# Patient Record
Sex: Male | Born: 1950 | Race: White | Hispanic: No | Marital: Married | Smoking: Never smoker
Health system: Southern US, Community
[De-identification: ages and names within clinical notes are randomized; demographics above are authoritative.]

## PROBLEM LIST (undated history)

## (undated) DIAGNOSIS — I890 Lymphedema, not elsewhere classified: Secondary | ICD-10-CM

## (undated) DIAGNOSIS — I4891 Unspecified atrial fibrillation: Secondary | ICD-10-CM

## (undated) DIAGNOSIS — I509 Heart failure, unspecified: Secondary | ICD-10-CM

## (undated) DIAGNOSIS — Z973 Presence of spectacles and contact lenses: Secondary | ICD-10-CM

## (undated) DIAGNOSIS — I639 Cerebral infarction, unspecified: Secondary | ICD-10-CM

## (undated) DIAGNOSIS — I2699 Other pulmonary embolism without acute cor pulmonale: Secondary | ICD-10-CM

## (undated) DIAGNOSIS — I429 Cardiomyopathy, unspecified: Secondary | ICD-10-CM

## (undated) DIAGNOSIS — G4733 Obstructive sleep apnea (adult) (pediatric): Secondary | ICD-10-CM

## (undated) DIAGNOSIS — I5022 Chronic systolic (congestive) heart failure: Secondary | ICD-10-CM

## (undated) DIAGNOSIS — I739 Peripheral vascular disease, unspecified: Secondary | ICD-10-CM

## (undated) DIAGNOSIS — E119 Type 2 diabetes mellitus without complications: Secondary | ICD-10-CM

## (undated) DIAGNOSIS — E559 Vitamin D deficiency, unspecified: Secondary | ICD-10-CM

## (undated) DIAGNOSIS — I10 Essential (primary) hypertension: Secondary | ICD-10-CM

## (undated) DIAGNOSIS — Z992 Dependence on renal dialysis: Secondary | ICD-10-CM

## (undated) DIAGNOSIS — E039 Hypothyroidism, unspecified: Secondary | ICD-10-CM

## (undated) DIAGNOSIS — R519 Headache, unspecified: Secondary | ICD-10-CM

## (undated) DIAGNOSIS — E785 Hyperlipidemia, unspecified: Secondary | ICD-10-CM

## (undated) DIAGNOSIS — N186 End stage renal disease: Secondary | ICD-10-CM

## (undated) HISTORY — PX: CATARACT EXTRACTION W/ INTRAOCULAR LENS  IMPLANT, BILATERAL: SHX1307

## (undated) HISTORY — DX: Heart failure, unspecified: I50.9

## (undated) HISTORY — DX: End stage renal disease: Z99.2

## (undated) HISTORY — DX: Obstructive sleep apnea (adult) (pediatric): G47.33

## (undated) HISTORY — DX: End stage renal disease: N18.6

## (undated) HISTORY — DX: Lymphedema, not elsewhere classified: I89.0

## (undated) HISTORY — DX: Essential (primary) hypertension: I10

## (undated) HISTORY — DX: Unspecified atrial fibrillation: I48.91

## (undated) HISTORY — DX: Hypothyroidism, unspecified: E03.9

## (undated) HISTORY — DX: Cardiomyopathy, unspecified: I42.9

## (undated) HISTORY — PX: TONSILLECTOMY: SUR1361

## (undated) HISTORY — PX: HERNIA REPAIR: SHX51

## (undated) HISTORY — DX: Hyperlipidemia, unspecified: E78.5

## (undated) HISTORY — PX: APPENDECTOMY: SHX54

## (undated) HISTORY — DX: Peripheral vascular disease, unspecified: I73.9

## (undated) HISTORY — PX: WISDOM TOOTH EXTRACTION: SHX21

## (undated) HISTORY — PX: BARIATRIC SURGERY: SHX1103

---
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
## 2019-01-07 ENCOUNTER — Encounter: Payer: Self-pay | Admitting: Cardiology

## 2019-01-07 ENCOUNTER — Ambulatory Visit: Payer: Medicare HMO | Admitting: Cardiology

## 2019-01-07 ENCOUNTER — Encounter (INDEPENDENT_AMBULATORY_CARE_PROVIDER_SITE_OTHER): Payer: Self-pay

## 2019-01-07 ENCOUNTER — Other Ambulatory Visit: Payer: Self-pay

## 2019-01-07 VITALS — BP 116/71 | HR 73 | Temp 97.0°F | Ht 71.0 in | Wt 209.0 lb

## 2019-01-07 DIAGNOSIS — E782 Mixed hyperlipidemia: Secondary | ICD-10-CM

## 2019-01-07 DIAGNOSIS — I890 Lymphedema, not elsewhere classified: Secondary | ICD-10-CM

## 2019-01-07 DIAGNOSIS — I480 Paroxysmal atrial fibrillation: Secondary | ICD-10-CM

## 2019-01-07 DIAGNOSIS — I4821 Permanent atrial fibrillation: Secondary | ICD-10-CM | POA: Diagnosis not present

## 2019-01-07 DIAGNOSIS — Z8679 Personal history of other diseases of the circulatory system: Secondary | ICD-10-CM

## 2019-01-07 DIAGNOSIS — I10 Essential (primary) hypertension: Secondary | ICD-10-CM

## 2019-01-07 NOTE — Progress Notes (Signed)
Cardiology Office Note  Date: 01/07/2019   ID: Lawrence Bonilla, DOB 11/17/1950, MRN [id number]  PCP:  Doree Albee, MD  Consulting Cardiologist: [name], MD Electrophysiologist:  None   Chief Complaint  Patient presents with  . Establish cardiac follow-up    History of Present Illness: Lawrence Bonilla is a 68 y.o. male referred for cardiology consultation by Dr. Anastasio Champion to establish follow-up of reported congestive heart failure and atrial fibrillation.  Patient relocated from [place] back in March.  He previously followed with a [facility], we are attempting to get his records.  He is aware of having had atrial fibrillation for many years and has been on Coumadin long-term.  He has not had a PT/INR checked since moving to this area.  The history of heart failure is a bit more difficult to ascertain at this point, he states that he was hospitalized at some point during the winter months of last year.  He has been on diuretics which were increased by his previous cardiologist following hospitalization.  He also has a longterm history of lymphedema and uses compression stockings as well as wraps in the evenings.  As far as symptoms are concerned, he does not report any sense of palpitations or chest pain.  He reports generally worsening dyspnea on exertion over a period of months.  He describes NYHA class II-III symptoms depending on level of activity.  Recent lab work from June is outlined below.  He has renal insufficiency with creatinine of 2.33 and BUN of 50.  He saw a nephrologist in [place] earlier today with further work-up ongoing.  Whether this is acute on chronic renal insufficiency is unclear.  I reviewed his medications.  He was not taken off Lasix or lisinopril per nephrologist this morning.  I personally reviewed his ECG today which shows rate controlled atrial fibrillation with left anterior fascicular block and nonspecific T wave changes.  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  Past Medical History:  Diagnosis Date  . Atrial fibrillation ([place])   . Congestive heart failure (CHF) ([place])   . DM (diabetes mellitus), type 2 ([place])   . Essential hypertension   . Hyperlipidemia   . Hypothyroidism   . Lymphedema   . PVD (peripheral vascular disease) ([place])     Past Surgical History:  Procedure Laterality Date  . APPENDECTOMY    . BARIATRIC SURGERY      Current Outpatient Medications  Medication Sig Dispense Refill  . atorvastatin (LIPITOR) 40 MG tablet Take 40 mg by mouth daily.    . furosemide (LASIX) 40 MG tablet Take 80 mg by mouth 2 (two) times daily.    . isosorbide mononitrate (IMDUR) 30 MG 24 hr tablet Take 30 mg by mouth daily.    Marland Kitchen lisinopril (ZESTRIL) 10 MG tablet Take 10 mg by mouth 2 (two) times daily.    . metoprolol succinate (TOPROL-XL) 100 MG 24 hr tablet Take 100 mg by mouth 2 (two) times daily. Take with or immediately following a meal.    . NP THYROID 30 MG tablet TK 1 T PO QD    . warfarin (COUMADIN) 2.5 MG tablet Take 2.5 mg by mouth daily. TWO TABLETS DAILY EXCEPT ONE TABLET ON TUESDAYS & THURSDAYS.     No current facility-administered medications for this visit.    Allergies:  Patient has no known allergies.  Social History: The patient  reports that he has never smoked. He has never used smokeless tobacco. He reports current alcohol use. He reports that he does not use drugs.   Family History: The patient's family history includes Asthma in his mother.   ROS:  Please see the history of present illness. Otherwise, complete review of systems is positive for none.  All other systems are reviewed and negative.   Physical Exam: VS:  BP 116/71   Pulse 73   Temp (!) 97 F (36.1 C)   Ht 5\' 11"  (1.803 m)   Wt 209 lb (94.8 kg)   BMI 29.15 kg/m , BMI Body mass index is 29.15 kg/m.  Wt Readings from Last 3 Encounters:  01/07/19 209  lb (94.8 kg)  10/31/18 209 lb (94.8 kg)    General: Overweight male in no distress. HEENT: Conjunctiva and lids normal, wearing a mask. Neck: Supple, prominent jugular venous pulsations, no thyromegaly. Lungs: Decreased breath sounds without wheezing, nonlabored breathing at rest. Cardiac: Irregularly irregular, no S3, soft apical systolic murmur, no pericardial rub. Abdomen: Soft, nontender, bowel sounds present. Extremities: Chronic appearing lymphedema and venous stasis, distal pulses diminished. Skin: Warm and dry. Musculoskeletal: Kyphosis present. Neuropsychiatric: Alert and oriented x3, affect grossly appropriate.  ECG:  No old tracing available for review.  Recent Labwork:  June 2020: Cholesterol 83, HDL 39, triglycerides 67, LDL 30, BUN 50, creatinine 2.33, potassium 4.0, sodium 142, AST 42, ALT 34, hemoglobin 13.8, platelets 127, hemoglobin A1c 6%, TSH 4.06  Other Studies Reviewed Today:  No previous cardiac studies available for review.  Assessment and Plan:  1.  Permanent atrial fibrillation by history with good heart rate control noted today by ECG and asymptomatic in terms of palpitations.  He is on Coumadin for stroke prophylaxis which he has taken for many years and is on Toprol-XL for heart rate control.  We will establish him in our anticoagulation clinic.  2.  Reported history of congestive heart failure, details unclear.  Plan to obtain records and also proceed with an echocardiogram to define LVEF.  With dyspnea on exertion that has been worsening over the last few months, anticipate further medication adjustments at a minimum.  3.  Renal insufficiency, BUN 50 and creatinine 2.33.  Patient established with a nephrologist earlier this morning.  Whether this is acute on chronic is not clear.  He is on both high-dose Lasix and ACE inhibitor.  Anticipates further renal testing which has been arranged.  4.  Chronic lymphedema.  He uses compression stockings and also  wraps his legs at nighttime.  5.  Essential hypertension, blood pressure well controlled today.  Medication Adjustments/Labs and Tests Ordered: Current medicines are reviewed at length with the patient today.  Concerns regarding medicines are outlined above.   Tests Ordered: Orders Placed This Encounter  Procedures  . EKG 12-Lead  . ECHOCARDIOGRAM COMPLETE    Medication Changes: No orders of the defined types were placed in this encounter.   Disposition:  Follow up pending study results and review of records.  Signed, [name], MD, [facility] 01/07/2019 2:47 PM    [place] [facility] [address], [place], [place] [address] Phone: [phone]; Fax: [phone]

## 2019-01-07 NOTE — Patient Instructions (Addendum)
  Medication Instructions: Your physician recommends that you continue on your current medications as directed. Please refer to the Current Medication list given to you today.   Labwork: None today  Procedures/Testing: Your physician has requested that you have an echocardiogram. Echocardiography is a painless test that uses sound waves to create images of your heart. It provides your doctor with information about the size and shape of your heart and how well your heart's chambers and valves are working. This procedure takes approximately one hour. There are no restrictions for this procedure.  We will schedule you an apt with Edrick Oh, RN, our coumadin nurse  Follow-Up: To be determined  Any Additional Special Instructions Will Be Listed Below (If Applicable).     If you need a refill on your cardiac medications before your next appointment, please call your pharmacy.

## 2019-01-10 ENCOUNTER — Ambulatory Visit (HOSPITAL_COMMUNITY)
Admission: RE | Admit: 2019-01-10 | Discharge: 2019-01-10 | Disposition: A | Discharge status: Home / Self Care | Payer: 59 | Source: Ambulatory Visit | Attending: Cardiology | Admitting: Cardiology

## 2019-01-10 ENCOUNTER — Other Ambulatory Visit: Payer: Self-pay

## 2019-01-10 DIAGNOSIS — I429 Cardiomyopathy, unspecified: Secondary | ICD-10-CM | POA: Diagnosis not present

## 2019-01-10 DIAGNOSIS — E785 Hyperlipidemia, unspecified: Secondary | ICD-10-CM | POA: Insufficient documentation

## 2019-01-10 DIAGNOSIS — Z8679 Personal history of other diseases of the circulatory system: Secondary | ICD-10-CM

## 2019-01-10 DIAGNOSIS — I509 Heart failure, unspecified: Secondary | ICD-10-CM | POA: Diagnosis not present

## 2019-01-10 DIAGNOSIS — E039 Hypothyroidism, unspecified: Secondary | ICD-10-CM | POA: Insufficient documentation

## 2019-01-10 DIAGNOSIS — I272 Pulmonary hypertension, unspecified: Secondary | ICD-10-CM | POA: Insufficient documentation

## 2019-01-10 DIAGNOSIS — I110 Hypertensive heart disease with heart failure: Secondary | ICD-10-CM | POA: Diagnosis not present

## 2019-01-10 DIAGNOSIS — I4891 Unspecified atrial fibrillation: Secondary | ICD-10-CM | POA: Diagnosis not present

## 2019-01-10 DIAGNOSIS — E119 Type 2 diabetes mellitus without complications: Secondary | ICD-10-CM | POA: Insufficient documentation

## 2019-01-10 NOTE — Progress Notes (Signed)
*  PRELIMINARY RESULTS* Echocardiogram 2D Echocardiogram has been performed.  Lawrence Bonilla 01/10/2019, 10:45 AM

## 2019-01-11 ENCOUNTER — Other Ambulatory Visit: Payer: Self-pay | Admitting: Nephrology

## 2019-01-11 DIAGNOSIS — N184 Chronic kidney disease, stage 4 (severe): Secondary | ICD-10-CM

## 2019-01-15 ENCOUNTER — Telehealth: Payer: Self-pay

## 2019-01-15 ENCOUNTER — Ambulatory Visit
Admission: RE | Admit: 2019-01-15 | Discharge: 2019-01-15 | Disposition: A | Discharge status: Home / Self Care | Payer: Medicare HMO | Source: Ambulatory Visit | Attending: Nephrology | Admitting: Nephrology

## 2019-01-15 DIAGNOSIS — N184 Chronic kidney disease, stage 4 (severe): Secondary | ICD-10-CM

## 2019-01-15 NOTE — Telephone Encounter (Signed)
-----   Message from [name], MD sent at 01/11/2019  8:11 AM EDT ----- Results reviewed.  LVEF is reduced at 25 to [id number], I am not certain about his prior baseline as we still await records.  Let us try and have him stop Zestril and replace it with Entresto after appropriate washout, start at 24/26 mg twice daily.  Reduce Lasix to 80 mg in the morning and 40 mg in the afternoon.  He will need a follow-up BMET in 7 to 10 days after this change.  Please make sure that he has an office follow-up visit within the next 4 to 6 weeks as well.

## 2019-01-15 NOTE — Telephone Encounter (Signed)
Called pt. No answer. Left message for pt to return call.  

## 2019-01-17 ENCOUNTER — Ambulatory Visit: Payer: Self-pay | Admitting: Cardiology

## 2019-01-17 ENCOUNTER — Ambulatory Visit (INDEPENDENT_AMBULATORY_CARE_PROVIDER_SITE_OTHER): Payer: Medicare HMO | Admitting: *Deleted

## 2019-01-17 ENCOUNTER — Other Ambulatory Visit: Payer: Self-pay

## 2019-01-17 DIAGNOSIS — Z5181 Encounter for therapeutic drug level monitoring: Secondary | ICD-10-CM | POA: Diagnosis not present

## 2019-01-17 DIAGNOSIS — I480 Paroxysmal atrial fibrillation: Secondary | ICD-10-CM | POA: Diagnosis not present

## 2019-01-17 DIAGNOSIS — I4891 Unspecified atrial fibrillation: Secondary | ICD-10-CM | POA: Insufficient documentation

## 2019-01-17 LAB — POCT INR: INR: 2.6 (ref 2.0–3.0)

## 2019-01-17 NOTE — Patient Instructions (Signed)
Continue coumadin 2 tablets daily except 1 tablet on Tuesdays and Thursdays Recheck in 4 weeks

## 2019-01-22 ENCOUNTER — Telehealth (INDEPENDENT_AMBULATORY_CARE_PROVIDER_SITE_OTHER): Payer: Self-pay | Admitting: Internal Medicine

## 2019-01-22 ENCOUNTER — Other Ambulatory Visit: Payer: Self-pay

## 2019-01-22 DIAGNOSIS — Z20822 Contact with and (suspected) exposure to covid-19: Secondary | ICD-10-CM

## 2019-01-22 NOTE — Telephone Encounter (Signed)
Patient called to see if he needed a Dr note to go get Covid testing.  Referred patient to the drive thru testing center in front of [facility].

## 2019-01-24 LAB — NOVEL CORONAVIRUS, NAA: SARS-CoV-2, NAA: NOT DETECTED

## 2019-01-25 ENCOUNTER — Telehealth: Payer: Self-pay

## 2019-01-25 DIAGNOSIS — Z79899 Other long term (current) drug therapy: Secondary | ICD-10-CM

## 2019-01-25 MED ORDER — ENTRESTO 24-26 MG PO TABS: 1.0000 | ORAL_TABLET | Freq: Two times a day (BID) | ORAL | 6 refills | Status: DC

## 2019-01-25 MED ORDER — FUROSEMIDE 40 MG PO TABS: ORAL_TABLET | ORAL | 6 refills | Status: DC

## 2019-01-25 NOTE — Telephone Encounter (Signed)
I spoke with patient, he will stop Lisinopril after today, 01/25/2019.He will start Sunday 01/27/2019 Entresto 24/26 mg BID.He will have repeat BMET in 7-10 days.He will also reduce lasix to 80 mg am and 40 mg pm.Follow up with Dr.McDowell on 03/01/2019 at 4:20 pm in the [facility] office

## 2019-01-25 NOTE — Telephone Encounter (Signed)
-----   Message from [name], MD sent at 01/11/2019  8:11 AM EDT ----- Results reviewed.  LVEF is reduced at 25 to [id number], I am not certain about his prior baseline as we still await records.  Let us try and have him stop Zestril and replace it with Entresto after appropriate washout, start at 24/26 mg twice daily.  Reduce Lasix to 80 mg in the morning and 40 mg in the afternoon.  He will need a follow-up BMET in 7 to 10 days after this change.  Please make sure that he has an office follow-up visit within the next 4 to 6 weeks as well.

## 2019-01-27 ENCOUNTER — Other Ambulatory Visit: Payer: Self-pay

## 2019-01-27 ENCOUNTER — Inpatient Hospital Stay (HOSPITAL_COMMUNITY)
Admission: EM | Admit: 2019-01-27 | Discharge: 2019-02-01 | DRG: 291 | Disposition: A | Discharge status: Home Health | Payer: 59 | Attending: Family Medicine | Admitting: Family Medicine

## 2019-01-27 DIAGNOSIS — E1140 Type 2 diabetes mellitus with diabetic neuropathy, unspecified: Secondary | ICD-10-CM | POA: Diagnosis present

## 2019-01-27 DIAGNOSIS — Z9884 Bariatric surgery status: Secondary | ICD-10-CM

## 2019-01-27 DIAGNOSIS — I480 Paroxysmal atrial fibrillation: Secondary | ICD-10-CM

## 2019-01-27 DIAGNOSIS — Z8673 Personal history of transient ischemic attack (TIA), and cerebral infarction without residual deficits: Secondary | ICD-10-CM

## 2019-01-27 DIAGNOSIS — Z79899 Other long term (current) drug therapy: Secondary | ICD-10-CM

## 2019-01-27 DIAGNOSIS — E1122 Type 2 diabetes mellitus with diabetic chronic kidney disease: Secondary | ICD-10-CM | POA: Diagnosis present

## 2019-01-27 DIAGNOSIS — Z20828 Contact with and (suspected) exposure to other viral communicable diseases: Secondary | ICD-10-CM | POA: Diagnosis present

## 2019-01-27 DIAGNOSIS — I130 Hypertensive heart and chronic kidney disease with heart failure and stage 1 through stage 4 chronic kidney disease, or unspecified chronic kidney disease: Principal | ICD-10-CM | POA: Diagnosis present

## 2019-01-27 DIAGNOSIS — J90 Pleural effusion, not elsewhere classified: Secondary | ICD-10-CM | POA: Diagnosis present

## 2019-01-27 DIAGNOSIS — I5021 Acute systolic (congestive) heart failure: Secondary | ICD-10-CM

## 2019-01-27 DIAGNOSIS — N179 Acute kidney failure, unspecified: Secondary | ICD-10-CM

## 2019-01-27 DIAGNOSIS — E1151 Type 2 diabetes mellitus with diabetic peripheral angiopathy without gangrene: Secondary | ICD-10-CM | POA: Diagnosis present

## 2019-01-27 DIAGNOSIS — I25119 Atherosclerotic heart disease of native coronary artery with unspecified angina pectoris: Secondary | ICD-10-CM | POA: Diagnosis present

## 2019-01-27 DIAGNOSIS — I509 Heart failure, unspecified: Secondary | ICD-10-CM

## 2019-01-27 DIAGNOSIS — M542 Cervicalgia: Secondary | ICD-10-CM

## 2019-01-27 DIAGNOSIS — I209 Angina pectoris, unspecified: Secondary | ICD-10-CM

## 2019-01-27 DIAGNOSIS — I5023 Acute on chronic systolic (congestive) heart failure: Secondary | ICD-10-CM

## 2019-01-27 DIAGNOSIS — Z23 Encounter for immunization: Secondary | ICD-10-CM

## 2019-01-27 DIAGNOSIS — Z7901 Long term (current) use of anticoagulants: Secondary | ICD-10-CM

## 2019-01-27 DIAGNOSIS — I639 Cerebral infarction, unspecified: Secondary | ICD-10-CM

## 2019-01-27 DIAGNOSIS — Z79890 Hormone replacement therapy (postmenopausal): Secondary | ICD-10-CM

## 2019-01-27 DIAGNOSIS — N184 Chronic kidney disease, stage 4 (severe): Secondary | ICD-10-CM | POA: Diagnosis present

## 2019-01-27 DIAGNOSIS — R791 Abnormal coagulation profile: Secondary | ICD-10-CM

## 2019-01-27 DIAGNOSIS — E785 Hyperlipidemia, unspecified: Secondary | ICD-10-CM | POA: Diagnosis present

## 2019-01-27 DIAGNOSIS — E039 Hypothyroidism, unspecified: Secondary | ICD-10-CM | POA: Diagnosis present

## 2019-01-27 DIAGNOSIS — J9601 Acute respiratory failure with hypoxia: Secondary | ICD-10-CM | POA: Diagnosis present

## 2019-01-27 DIAGNOSIS — I2699 Other pulmonary embolism without acute cor pulmonale: Secondary | ICD-10-CM | POA: Diagnosis present

## 2019-01-27 DIAGNOSIS — E7800 Pure hypercholesterolemia, unspecified: Secondary | ICD-10-CM | POA: Diagnosis present

## 2019-01-27 DIAGNOSIS — I4891 Unspecified atrial fibrillation: Secondary | ICD-10-CM | POA: Diagnosis present

## 2019-01-27 DIAGNOSIS — R7989 Other specified abnormal findings of blood chemistry: Secondary | ICD-10-CM | POA: Diagnosis present

## 2019-01-27 MED ORDER — ASPIRIN 81 MG PO CHEW
324.0000 mg | CHEWABLE_TABLET | Freq: Once | ORAL | Status: AC
  Administered 2019-01-28: 01:00:00 324 mg via ORAL
  Filled 2019-01-27: qty 4

## 2019-01-27 NOTE — ED Triage Notes (Signed)
Occurs in evenings around dinner

## 2019-01-27 NOTE — ED Triage Notes (Signed)
Pt c/o sporatic neck pain. He thinks its his thyroid and due to changes in his medication. Been going on 3 days.

## 2019-01-28 ENCOUNTER — Inpatient Hospital Stay (HOSPITAL_COMMUNITY): Payer: 59

## 2019-01-28 ENCOUNTER — Emergency Department (HOSPITAL_COMMUNITY): Payer: 59

## 2019-01-28 DIAGNOSIS — I480 Paroxysmal atrial fibrillation: Secondary | ICD-10-CM

## 2019-01-28 DIAGNOSIS — E1140 Type 2 diabetes mellitus with diabetic neuropathy, unspecified: Secondary | ICD-10-CM | POA: Diagnosis present

## 2019-01-28 DIAGNOSIS — I2699 Other pulmonary embolism without acute cor pulmonale: Secondary | ICD-10-CM | POA: Diagnosis present

## 2019-01-28 DIAGNOSIS — Z79890 Hormone replacement therapy (postmenopausal): Secondary | ICD-10-CM | POA: Diagnosis not present

## 2019-01-28 DIAGNOSIS — I5023 Acute on chronic systolic (congestive) heart failure: Secondary | ICD-10-CM | POA: Diagnosis present

## 2019-01-28 DIAGNOSIS — E7800 Pure hypercholesterolemia, unspecified: Secondary | ICD-10-CM | POA: Diagnosis present

## 2019-01-28 DIAGNOSIS — N179 Acute kidney failure, unspecified: Secondary | ICD-10-CM | POA: Diagnosis present

## 2019-01-28 DIAGNOSIS — M542 Cervicalgia: Secondary | ICD-10-CM | POA: Diagnosis present

## 2019-01-28 DIAGNOSIS — R7989 Other specified abnormal findings of blood chemistry: Secondary | ICD-10-CM | POA: Diagnosis present

## 2019-01-28 DIAGNOSIS — E785 Hyperlipidemia, unspecified: Secondary | ICD-10-CM | POA: Diagnosis present

## 2019-01-28 DIAGNOSIS — I130 Hypertensive heart and chronic kidney disease with heart failure and stage 1 through stage 4 chronic kidney disease, or unspecified chronic kidney disease: Secondary | ICD-10-CM | POA: Diagnosis present

## 2019-01-28 DIAGNOSIS — Z7901 Long term (current) use of anticoagulants: Secondary | ICD-10-CM | POA: Diagnosis not present

## 2019-01-28 DIAGNOSIS — N184 Chronic kidney disease, stage 4 (severe): Secondary | ICD-10-CM

## 2019-01-28 DIAGNOSIS — R791 Abnormal coagulation profile: Secondary | ICD-10-CM | POA: Diagnosis present

## 2019-01-28 DIAGNOSIS — I639 Cerebral infarction, unspecified: Secondary | ICD-10-CM

## 2019-01-28 DIAGNOSIS — I25119 Atherosclerotic heart disease of native coronary artery with unspecified angina pectoris: Secondary | ICD-10-CM | POA: Diagnosis present

## 2019-01-28 DIAGNOSIS — Z8673 Personal history of transient ischemic attack (TIA), and cerebral infarction without residual deficits: Secondary | ICD-10-CM | POA: Diagnosis not present

## 2019-01-28 DIAGNOSIS — Z23 Encounter for immunization: Secondary | ICD-10-CM | POA: Diagnosis not present

## 2019-01-28 DIAGNOSIS — Z9884 Bariatric surgery status: Secondary | ICD-10-CM | POA: Diagnosis not present

## 2019-01-28 DIAGNOSIS — Z79899 Other long term (current) drug therapy: Secondary | ICD-10-CM | POA: Diagnosis not present

## 2019-01-28 DIAGNOSIS — Z20828 Contact with and (suspected) exposure to other viral communicable diseases: Secondary | ICD-10-CM | POA: Diagnosis present

## 2019-01-28 DIAGNOSIS — J9601 Acute respiratory failure with hypoxia: Secondary | ICD-10-CM | POA: Diagnosis present

## 2019-01-28 DIAGNOSIS — J90 Pleural effusion, not elsewhere classified: Secondary | ICD-10-CM | POA: Diagnosis not present

## 2019-01-28 DIAGNOSIS — E1151 Type 2 diabetes mellitus with diabetic peripheral angiopathy without gangrene: Secondary | ICD-10-CM | POA: Diagnosis present

## 2019-01-28 DIAGNOSIS — E1122 Type 2 diabetes mellitus with diabetic chronic kidney disease: Secondary | ICD-10-CM | POA: Diagnosis present

## 2019-01-28 DIAGNOSIS — E039 Hypothyroidism, unspecified: Secondary | ICD-10-CM | POA: Diagnosis present

## 2019-01-28 LAB — CBC WITH DIFFERENTIAL/PLATELET
Abs Immature Granulocytes: 0.03 10*3/uL (ref 0.00–0.07)
Basophils Absolute: 0 10*3/uL (ref 0.0–0.1)
Basophils Relative: 0 %
Eosinophils Absolute: 0 10*3/uL (ref 0.0–0.5)
Eosinophils Relative: 0 %
HCT: 46.4 % (ref 39.0–52.0)
Hemoglobin: 14.7 g/dL (ref 13.0–17.0)
Immature Granulocytes: 0 %
Lymphocytes Relative: 3 %
Lymphs Abs: 0.3 10*3/uL — ABNORMAL LOW (ref 0.7–4.0)
MCH: 34.6 pg — ABNORMAL HIGH (ref 26.0–34.0)
MCHC: 31.7 g/dL (ref 30.0–36.0)
MCV: 109.2 fL — ABNORMAL HIGH (ref 80.0–100.0)
Monocytes Absolute: 0.8 10*3/uL (ref 0.1–1.0)
Monocytes Relative: 10 %
Neutro Abs: 7.2 10*3/uL (ref 1.7–7.7)
Neutrophils Relative %: 87 %
Platelets: 126 10*3/uL — ABNORMAL LOW (ref 150–400)
RBC: 4.25 MIL/uL (ref 4.22–5.81)
RDW: 14.7 % (ref 11.5–15.5)
WBC: 8.3 10*3/uL (ref 4.0–10.5)
nRBC: 0 % (ref 0.0–0.2)

## 2019-01-28 LAB — PROTIME-INR
INR: 5.1 (ref 0.8–1.2)
Prothrombin Time: 46.1 seconds — ABNORMAL HIGH (ref 11.4–15.2)

## 2019-01-28 LAB — BASIC METABOLIC PANEL
Anion gap: 12 (ref 5–15)
BUN: 57 mg/dL — ABNORMAL HIGH (ref 8–23)
CO2: 24 mmol/L (ref 22–32)
Calcium: 8.8 mg/dL — ABNORMAL LOW (ref 8.9–10.3)
Chloride: 104 mmol/L (ref 98–111)
Creatinine, Ser: 2.41 mg/dL — ABNORMAL HIGH (ref 0.61–1.24)
GFR calc Af Amer: 31 mL/min — ABNORMAL LOW (ref >60)
GFR calc non Af Amer: 27 mL/min — ABNORMAL LOW (ref >60)
Glucose, Bld: 134 mg/dL — ABNORMAL HIGH (ref 70–99)
Potassium: 3.8 mmol/L (ref 3.5–5.1)
Sodium: 140 mmol/L (ref 135–145)

## 2019-01-28 LAB — TROPONIN I (HIGH SENSITIVITY)
Troponin I (High Sensitivity): 18 ng/L — ABNORMAL HIGH (ref <18)
Troponin I (High Sensitivity): 18 ng/L — ABNORMAL HIGH (ref <18)
Troponin I (High Sensitivity): 18 ng/L — ABNORMAL HIGH (ref <18)

## 2019-01-28 LAB — BRAIN NATRIURETIC PEPTIDE: B Natriuretic Peptide: >4500 pg/mL — ABNORMAL HIGH (ref 0.0–100.0)

## 2019-01-28 LAB — APTT: aPTT: 50 seconds — ABNORMAL HIGH (ref 24–36)

## 2019-01-28 LAB — MAGNESIUM: Magnesium: 2.4 mg/dL (ref 1.7–2.4)

## 2019-01-28 LAB — SEDIMENTATION RATE: Sed Rate: 14 mm/hr (ref 0–16)

## 2019-01-28 LAB — TSH: TSH: 3.511 u[IU]/mL (ref 0.350–4.500)

## 2019-01-28 MED ORDER — FUROSEMIDE 10 MG/ML IJ SOLN
60.0000 mg | Freq: Two times a day (BID) | INTRAMUSCULAR | Status: DC
  Administered 2019-01-28 – 2019-01-29 (×4): 60 mg via INTRAVENOUS
  Filled 2019-01-28 (×4): qty 6

## 2019-01-28 MED ORDER — METOPROLOL SUCCINATE ER 100 MG PO TB24
100.0000 mg | ORAL_TABLET | Freq: Two times a day (BID) | ORAL | Status: DC
  Administered 2019-01-28 – 2019-02-01 (×9): 100 mg via ORAL
  Filled 2019-01-28 (×10): qty 1

## 2019-01-28 MED ORDER — SACUBITRIL-VALSARTAN 24-26 MG PO TABS
1.0000 | ORAL_TABLET | Freq: Two times a day (BID) | ORAL | Status: DC
  Administered 2019-01-28 – 2019-02-01 (×9): 1 via ORAL
  Filled 2019-01-28 (×10): qty 1

## 2019-01-28 MED ORDER — SODIUM CHLORIDE 0.9 % IV SOLN
250.0000 mL | INTRAVENOUS | Status: DC | PRN
  Administered 2019-01-28: 08:00:00 250 mL via INTRAVENOUS

## 2019-01-28 MED ORDER — SODIUM CHLORIDE 0.9% FLUSH
3.0000 mL | Freq: Two times a day (BID) | INTRAVENOUS | Status: DC
  Administered 2019-01-28 – 2019-02-01 (×6): 3 mL via INTRAVENOUS

## 2019-01-28 MED ORDER — ACETAMINOPHEN 325 MG PO TABS: 650.0000 mg | ORAL_TABLET | Freq: Four times a day (QID) | ORAL | Status: DC | PRN

## 2019-01-28 MED ORDER — THYROID 30 MG PO TABS
30.0000 mg | ORAL_TABLET | Freq: Every day | ORAL | Status: DC
  Administered 2019-01-29 – 2019-02-01 (×4): 30 mg via ORAL
  Filled 2019-01-28 (×5): qty 1

## 2019-01-28 MED ORDER — FUROSEMIDE 10 MG/ML IJ SOLN
80.0000 mg | Freq: Once | INTRAMUSCULAR | Status: AC
  Administered 2019-01-28: 05:00:00 80 mg via INTRAVENOUS
  Filled 2019-01-28: qty 8

## 2019-01-28 MED ORDER — SODIUM CHLORIDE 0.9% FLUSH: 3.0000 mL | INTRAVENOUS | Status: DC | PRN

## 2019-01-28 MED ORDER — SODIUM CHLORIDE 0.9 % IV SOLN
1.0000 g | INTRAVENOUS | Status: DC
  Administered 2019-01-28 – 2019-01-29 (×2): 1 g via INTRAVENOUS
  Filled 2019-01-28 (×3): qty 10

## 2019-01-28 MED ORDER — ATORVASTATIN CALCIUM 40 MG PO TABS
40.0000 mg | ORAL_TABLET | Freq: Every day | ORAL | Status: DC
  Administered 2019-01-28 – 2019-02-01 (×5): 40 mg via ORAL
  Filled 2019-01-28 (×7): qty 1

## 2019-01-28 MED ORDER — ISOSORBIDE MONONITRATE ER 30 MG PO TB24
30.0000 mg | ORAL_TABLET | Freq: Every day | ORAL | Status: DC
  Administered 2019-01-28 – 2019-02-01 (×5): 30 mg via ORAL
  Filled 2019-01-28 (×7): qty 1

## 2019-01-28 MED ORDER — ACETAMINOPHEN 650 MG RE SUPP: 650.0000 mg | Freq: Four times a day (QID) | RECTAL | Status: DC | PRN

## 2019-01-28 MED ORDER — SODIUM CHLORIDE 0.9 % IV SOLN
500.0000 mg | INTRAVENOUS | Status: DC
  Administered 2019-01-28 – 2019-01-29 (×2): 500 mg via INTRAVENOUS
  Filled 2019-01-28 (×3): qty 500

## 2019-01-28 NOTE — Progress Notes (Signed)
RN received called from MRI tech that when she lay patient down flat he turns blue on the lips and rest of body, RN asked tech to check O2 sats and put patient on 2liters, per tech sat was 89-90% on RA, she put patient on O2 but every time she lays him flat his lips turns blue and patient seems he cant breath, per patient he cannot lay flat.Lawrence Bonilla So MRI cannot be done today.

## 2019-01-28 NOTE — H&P (Addendum)
TRH H&P    Patient Demographics:    Lawrence Bonilla, is a 68 y.o. male  MRN: [id number]  DOB - 1950/09/28  Admit Date - 01/27/2019  Referring MD/NP/PA:   Kathrynn Humble  Outpatient Primary MD for the patient is Doree Albee, MD  Patient coming from:  home  Chief complaint- sob, chest pain    HPI:    Lawrence Bonilla  is a 68 y.o. male,  w hypertension, hyperlipidemia, Dm2 w neuropathy, hx of gastric bypass surgery, Pvd, Pafib, CHF, lymphedema who states that began to have right shoulder pain on Friday, and it was intermittent pain.  Eventually went away. Saturday woke up with sore neck on the right. Sunday the pain switched to the left neck.  Was intermittent.  C/o chest pain x2 weeks.  "sharp" all across his chest.  + sob.  Pt states has had cough since December 2019.  Recently mostly white but sometimes yellow.  Dry heaves for 2 weeks. Pt has been loosing weight.  Pt denies any excessive leg swelling.  His leg swelling is chronic.    Pt denies fever, chills, palp, abd pain, diarrhea, brbpr, dysuria, hematuria.   In ED,  T 97.9, P 80 R 20, pox 96% on RA Wt 94.8  CXR IMPRESSION: 1. Moderate right pleural effusion with associated right basilar opacity, which could reflect atelectasis or infiltrate. 2. Additional trace left pleural effusion with associated left basilar subsegmental atelectasis. 3. Underlying mild diffuse pulmonary interstitial congestion without frank pulmonary edema.  CT brain, CT C spine IMPRESSION: 1. Age-indeterminate infarct of the posterior right medial occipital lobe. 2. No acute fracture or malalignment of the cervical spine. 3. Findings consistent with age related atrophy and chronic small vessel ischemia  Na 140, K 3.8, Bun 57, Creatinine 2.41 Magnesium 2.4 Glucose 134 ESR 14 Wbc 8.3, Hgb 14.7, Plt 126  Trop 18->  18 BNP  > 4,500 INR 5.1  Pt will be admitted for chest  pain, and acute on chronic CHF (systolic, EF 56-31%), and elevated troponin, and supratherapeutic INR.      Review of systems:    In addition to the HPI above,  No Fever-chills, No Headache, No changes with Vision or hearing, No problems swallowing food or Liquids,   No Abdominal pain, No Nausea or Vomiting, bowel movements are regular, No Blood in stool or Urine, No dysuria, No new skin rashes or bruises, No new joints pains-aches,  No new weakness, tingling, numbness in any extremity, No recent weight gain or loss, No polyuria, polydypsia or polyphagia, No significant Mental Stressors.  All other systems reviewed and are negative.    Past History of the following :    Past Medical History:  Diagnosis Date   Atrial fibrillation ([place])    Congestive heart failure (CHF) (HCC)    DM (diabetes mellitus), type 2 ([place])    Essential hypertension    Hyperlipidemia    Hypothyroidism    Lymphedema    PVD (peripheral vascular disease) ([place])       Past Surgical History:  Procedure Laterality Date   APPENDECTOMY     BARIATRIC SURGERY        Social History:      Social History   Tobacco Use   Smoking status: Never Smoker   Smokeless tobacco: Never Used  Substance Use Topics   Alcohol use: Yes    Comment: 2 SCOTCH        Family History :     Family History  Problem Relation Age of Onset   Asthma Mother        Home Medications:   Prior to Admission medications   Medication Sig Start Date End Date Taking? Authorizing Provider  atorvastatin (LIPITOR) 40 MG tablet Take 40 mg by mouth daily.    [provider]  furosemide (LASIX) 40 MG tablet Take 80 mg am ( 2 tablets) and 40 mg pm (1 tablet) 01/25/19   [name], MD  isosorbide mononitrate (IMDUR) 30 MG 24 hr tablet Take 30 mg by mouth daily.    [provider]  metoprolol succinate (TOPROL-XL) 100 MG 24 hr tablet Take 100 mg by mouth 2 (two) times daily. Take with or  immediately following a meal.    [provider]  NP THYROID 30 MG tablet TK 1 T PO QD 12/23/18   [provider]  sacubitril-valsartan (ENTRESTO) 24-26 MG Take 1 tablet by mouth 2 (two) times daily. 01/25/19   [name], MD  warfarin (COUMADIN) 2.5 MG tablet Take 2.5 mg by mouth daily. TWO TABLETS DAILY EXCEPT ONE TABLET ON TUESDAYS & THURSDAYS.    [provider]     Allergies:    No Known Allergies   Physical Exam:   Vitals  Blood pressure (!) 138/98, pulse 72, temperature 97.9 F (36.6 C), temperature source Oral, resp. rate (!) 22, height '5\' 11"'  (1.803 m), weight 94.8 kg, SpO2 93 %.  1.  General: axoxo3  2. Psychiatric: Anxious , OCD  3. Neurologic: Nonfocal,    4. HEENMT:  Anicteric, pupils 1.33m symmetric, direct, consensual , near intact Neck: + jvd, no bruit  5. Respiratory : Decrease in BS at right lung base, no wheezing, crackles at  left lung base about 1/4 up.   6. Cardiovascular : Irr, irr, s1, s2,   7. Gastrointestinal:  Abd: soft, nt, nd, +bs  8. Skin:  Ext: no c/c/e,  No rash  9.Musculoskeletal:  Good ROM,  No adenopathy    Data Review:    CBC Recent Labs  Lab 01/28/19 0031  WBC 8.3  HGB 14.7  HCT 46.4  PLT 126*  MCV 109.2*  MCH 34.6*  MCHC 31.7  RDW 14.7  LYMPHSABS 0.3*  MONOABS 0.8  EOSABS 0.0  BASOSABS 0.0   ------------------------------------------------------------------------------------------------------------------  Results for orders placed or performed during the hospital encounter of 01/27/19 (from the past 48 hour(s))  Basic metabolic panel     Status: Abnormal   Collection Time: 01/28/19 12:31 AM  Result Value Ref Range   Sodium 140 135 - 145 mmol/L   Potassium 3.8 3.5 - 5.1 mmol/L   Chloride 104 98 - 111 mmol/L   CO2 24 22 - 32 mmol/L   Glucose, Bld 134 (H) 70 - 99 mg/dL   BUN 57 (H) 8 - 23 mg/dL   Creatinine, Ser 2.41 (H) 0.61 - 1.24 mg/dL   Calcium 8.8 (L) 8.9 - 10.3 mg/dL     GFR calc non Af Amer 27 (L) >60 mL/min   GFR calc Af Amer 31 (  L) >60 mL/min   Anion gap 12 5 - 15    Comment: Performed at [facility], [address] [place], [place] [address]  Troponin I (High Sensitivity)     Status: Abnormal   Collection Time: 01/28/19 12:31 AM  Result Value Ref Range   Troponin I (High Sensitivity) 18 (H) <18 ng/L    Comment: (NOTE) Elevated high sensitivity troponin I (hsTnI) values and significant  changes across serial measurements may suggest ACS but many other  chronic and acute conditions are known to elevate hsTnI results.  Refer to the "Links" section for chest pain algorithms and additional  guidance. Performed at [facility], [address], [place], [place] [address]   Magnesium     Status: None   Collection Time: 01/28/19 12:31 AM  Result Value Ref Range   Magnesium 2.4 1.7 - 2.4 mg/dL    Comment: Performed at [facility], [address] [place], [place] [address]  CBC with Differential/Platelet     Status: Abnormal   Collection Time: 01/28/19 12:31 AM  Result Value Ref Range   WBC 8.3 4.0 - 10.5 K/uL   RBC 4.25 4.22 - 5.81 MIL/uL   Hemoglobin 14.7 13.0 - 17.0 g/dL   HCT 46.4 39.0 - 52.0 %   MCV 109.2 (H) 80.0 - 100.0 fL   MCH 34.6 (H) 26.0 - 34.0 pg   MCHC 31.7 30.0 - 36.0 g/dL   RDW 14.7 11.5 - 15.5 %   Platelets 126 (L) 150 - 400 K/uL   nRBC 0.0 0.0 - 0.2 %   Neutrophils Relative % 87 %   Neutro Abs 7.2 1.7 - 7.7 K/uL   Lymphocytes Relative 3 %   Lymphs Abs 0.3 (L) 0.7 - 4.0 K/uL   Monocytes Relative 10 %   Monocytes Absolute 0.8 0.1 - 1.0 K/uL   Eosinophils Relative 0 %   Eosinophils Absolute 0.0 0.0 - 0.5 K/uL   Basophils Relative 0 %   Basophils Absolute 0.0 0.0 - 0.1 K/uL   Immature Granulocytes 0 %   Abs Immature Granulocytes 0.03 0.00 - 0.07 K/uL    Comment: Performed at [facility], [address], [place], [place] [address]  APTT     Status: Abnormal   Collection Time: 01/28/19 12:31 AM  Result Value Ref Range   aPTT  50 (H) 24 - 36 seconds    Comment:        IF BASELINE aPTT IS ELEVATED, SUGGEST PATIENT RISK ASSESSMENT BE USED TO DETERMINE APPROPRIATE ANTICOAGULANT THERAPY. Performed at [facility], [address] [place], [place] [address]   Protime-INR     Status: Abnormal   Collection Time: 01/28/19 12:31 AM  Result Value Ref Range   Prothrombin Time 46.1 (H) 11.4 - 15.2 seconds   INR 5.1 (HH) 0.8 - 1.2    Comment: CRITICAL RESULT CALLED TO, READ BACK BY AND VERIFIED WITH: WALKER,T. AT 0118 ON 01/28/2019 BY EVA (NOTE) INR goal varies based on device and disease states. Performed at [facility], [address], [place], [place] [address]   Sedimentation rate     Status: None   Collection Time: 01/28/19 12:31 AM  Result Value Ref Range   Sed Rate 14 0 - 16 mm/hr    Comment: Performed at [facility], [address] [place], [place] [address]  Brain natriuretic peptide     Status: Abnormal   Collection Time: 01/28/19 12:31 AM  Result Value Ref Range   B Natriuretic Peptide >4,500.0 (  H) 0.0 - 100.0 pg/mL    Comment: Performed at [facility], [address] [place], [place] [address]  Troponin I (High Sensitivity)     Status: Abnormal   Collection Time: 01/28/19  2:51 AM  Result Value Ref Range   Troponin I (High Sensitivity) 18 (H) <18 ng/L    Comment: (NOTE) Elevated high sensitivity troponin I (hsTnI) values and significant  changes across serial measurements may suggest ACS but many other  chronic and acute conditions are known to elevate hsTnI results.  Refer to the "Links" section for chest pain algorithms and additional  guidance. Performed at [facility], [address], [place], [place] [address]   Troponin I (High Sensitivity)     Status: Abnormal   Collection Time: 01/28/19  5:23 AM  Result Value Ref Range   Troponin I (High Sensitivity) 18 (H) <18 ng/L    Comment: (NOTE) Elevated high sensitivity troponin I (hsTnI) values and significant  changes across serial  measurements may suggest ACS but many other  chronic and acute conditions are known to elevate hsTnI results.  Refer to the "Links" section for chest pain algorithms and additional  guidance. Performed at [facility], [address], [place],  [address]     Chemistries  Recent Labs  Lab 01/28/19 0031  NA 140  K 3.8  CL 104  CO2 24  GLUCOSE 134*  BUN 57*  CREATININE 2.41*  CALCIUM 8.8*  MG 2.4   ------------------------------------------------------------------------------------------------------------------  ------------------------------------------------------------------------------------------------------------------ GFR: Estimated Creatinine Clearance: 34.5 mL/min (A) (by C-G formula based on SCr of 2.41 mg/dL (H)). Liver Function Tests: No results for input(s): AST, ALT, ALKPHOS, BILITOT, PROT, ALBUMIN in the last 168 hours. No results for input(s): LIPASE, AMYLASE in the last 168 hours. No results for input(s): AMMONIA in the last 168 hours. Coagulation Profile: Recent Labs  Lab 01/28/19 0031  INR 5.1*   Cardiac Enzymes: No results for input(s): CKTOTAL, CKMB, CKMBINDEX, TROPONINI in the last 168 hours. BNP (last 3 results) No results for input(s): PROBNP in the last 8760 hours. HbA1C: No results for input(s): HGBA1C in the last 72 hours. CBG: No results for input(s): GLUCAP in the last 168 hours. Lipid Profile: No results for input(s): CHOL, HDL, LDLCALC, TRIG, CHOLHDL, LDLDIRECT in the last 72 hours. Thyroid Function Tests: No results for input(s): TSH, T4TOTAL, FREET4, T3FREE, THYROIDAB in the last 72 hours. Anemia Panel: No results for input(s): VITAMINB12, FOLATE, FERRITIN, TIBC, IRON, RETICCTPCT in the last 72 hours.  --------------------------------------------------------------------------------------------------------------- Urine analysis: No results found for: COLORURINE, APPEARANCEUR, LABSPEC, PHURINE, GLUCOSEU, HGBUR, BILIRUBINUR,  KETONESUR, PROTEINUR, UROBILINOGEN, NITRITE, LEUKOCYTESUR    Imaging Results:    Ct Head Wo Contrast  Result Date: 01/28/2019 CLINICAL DATA:  Ataxia, fall 5 days ago EXAM: CT HEAD WITHOUT CONTRAST; CT CERVICAL SPINE WITHOUT CONTRAST TECHNIQUE: Contiguous axial images were obtained from the base of the skull through the vertex without intravenous contrast. COMPARISON:  MRI March 29, 2017 FINDINGS: Brain: There is a area of hypodensity seen within the posterior right occipital lobe. There is dilatation the ventricles and sulci consistent with age-related atrophy. There patchy areas of low-attenuation changes in the deep white matter consistent with small vessel ischemia. No extra-axial collections. Vascular: No hyperdense vessel or unexpected calcification. Skull: The skull is intact. No fracture or focal lesion identified. Sinuses/Orbits: The visualized paranasal sinuses and mastoid air cells are clear. The orbits and globes intact. Other: None Cervical spine: Alignment: Physiologic Skull base and vertebrae: Visualized skull base is intact.  No atlanto-occipital dissociation. The vertebral body heights are well maintained. No fracture or pathologic osseous lesion seen. Soft tissues and spinal canal: The visualized paraspinal soft tissues are unremarkable. No prevertebral soft tissue swelling is seen. The spinal canal is grossly unremarkable, no large epidural collection or significant canal narrowing. Disc levels: Degenerative changes are seen most notable at C5-C6 with disc osteophyte complex and uncovertebral osteophytes. Upper chest: The lung apices are clear. Thoracic inlet is within normal limits. Other: None IMPRESSION: 1. Age-indeterminate infarct of the posterior right medial occipital lobe. 2. No acute fracture or malalignment of the cervical spine. 3. Findings consistent with age related atrophy and chronic small vessel ischemia Electronically Signed   By: Prudencio Pair M.D.   On: 01/28/2019 02:42    Ct Cervical Spine Wo Contrast  Result Date: 01/28/2019 CLINICAL DATA:  Ataxia, fall 5 days ago EXAM: CT HEAD WITHOUT CONTRAST; CT CERVICAL SPINE WITHOUT CONTRAST TECHNIQUE: Contiguous axial images were obtained from the base of the skull through the vertex without intravenous contrast. COMPARISON:  MRI March 29, 2017 FINDINGS: Brain: There is a area of hypodensity seen within the posterior right occipital lobe. There is dilatation the ventricles and sulci consistent with age-related atrophy. There patchy areas of low-attenuation changes in the deep white matter consistent with small vessel ischemia. No extra-axial collections. Vascular: No hyperdense vessel or unexpected calcification. Skull: The skull is intact. No fracture or focal lesion identified. Sinuses/Orbits: The visualized paranasal sinuses and mastoid air cells are clear. The orbits and globes intact. Other: None Cervical spine: Alignment: Physiologic Skull base and vertebrae: Visualized skull base is intact. No atlanto-occipital dissociation. The vertebral body heights are well maintained. No fracture or pathologic osseous lesion seen. Soft tissues and spinal canal: The visualized paraspinal soft tissues are unremarkable. No prevertebral soft tissue swelling is seen. The spinal canal is grossly unremarkable, no large epidural collection or significant canal narrowing. Disc levels: Degenerative changes are seen most notable at C5-C6 with disc osteophyte complex and uncovertebral osteophytes. Upper chest: The lung apices are clear. Thoracic inlet is within normal limits. Other: None IMPRESSION: 1. Age-indeterminate infarct of the posterior right medial occipital lobe. 2. No acute fracture or malalignment of the cervical spine. 3. Findings consistent with age related atrophy and chronic small vessel ischemia Electronically Signed   By: Prudencio Pair M.D.   On: 01/28/2019 02:42   Dg Chest Portable 1 View  Result Date: 01/28/2019 CLINICAL DATA:   Initial evaluation for acute chest pain. EXAM: PORTABLE CHEST 1 VIEW COMPARISON:  Prior radiograph from 08/25/2018. FINDINGS: Exaggeration of the cardiac silhouette related to AP technique and low lung volumes. Mediastinal silhouette normal. Lungs hypoinflated. Diffuse pulmonary vascular congestion without frank pulmonary edema. Moderate right pleural effusion with associated right basilar opacity, which could reflect atelectasis or infiltrate. Additional trace left pleural effusion with associated mild left basilar subsegmental atelectasis. No pneumothorax. No acute osseous finding. IMPRESSION: 1. Moderate right pleural effusion with associated right basilar opacity, which could reflect atelectasis or infiltrate. 2. Additional trace left pleural effusion with associated left basilar subsegmental atelectasis. 3. Underlying mild diffuse pulmonary interstitial congestion without frank pulmonary edema. Electronically Signed   By: Jeannine Boga M.D.   On: 01/28/2019 00:12   EKG afib at 80, nl axis, no st-t changes c/w ischemia   Assessment & Plan:    Principal Problem:   Acute on chronic systolic CHF (congestive heart failure) (HCC) Active Problems:   Atrial fibrillation (HCC)   CKD (chronic kidney disease), stage  IV (HCC)   Pleural effusion on right   Stroke [facility])  Acute on Chronic systolic CHF (EF 58-59%) Tele Cycle cardiac markers Lasix 31m iv bid Cont Entresto 24-235mpo bid Cont Toprol XL 10043mo bid Cont Imdur 63m41m qday  Chest pain Please consult cardiology in AM  Pafib Cont Coumadin pharmacy to dose, will have to hold til INR lower  Supratherapeutic INR Hold Coumadin as above Check cbc in am  Hyperlipidemia Check Lipid Cont Lipitor 40mg51mqhs  Age indeterminate stroke on CT brain Check MRI brain w/o contrast Check carotid ultrasound Check cardiac echo  Cont coumadin as above  R pleural effusion  Check CT chest r/o loculation  Consider ultrasound guided  thoracentesis  ? CAP Blood culture x2 Urine strep antigen Urine legionella antigen Rocephin 1gm iv qday, Zithromax 500mg 13mday  Hypothyroidism Cont NP Thyroid 63mg p51may  Weight loss  Check TSH  CKD stage4 Check cmp in am   DVT Prophylaxis-   Coumadin  AM Labs Ordered, also please review Full Orders  Family Communication: Admission, patients condition and plan of care including tests being ordered have been discussed with the patient  who indicate understanding and agree with the plan and Code Status.  Code Status:  FULL CODE per patient,  [facility] message for wife Pandora [facility]  Admission status: Inpatient: Based on patients clinical presentation and evaluation of above clinical data, I have made determination that patient meets Inpatient criteria at this time.  Pt has acute on chronic systolic chf,  And has failed outpatient oral lasix.  Pt also has CKD stage4 and will require close monitoring of diuresis.  Pt also has supratherapeutic INR, and will require close monitoring to ensure that this returns to normal.  Pt has moderate sized right pleural effusion and will require evaluation.  Pt will require > 2nites stay.   Time spent in minutes : 70   Emilian Stawicki KJani Gravel 01/28/2019 at 6:39 AM

## 2019-01-28 NOTE — ED Provider Notes (Addendum)
[facility] EMERGENCY DEPARTMENT Provider Note   CSN: [id number] Arrival date & time: 01/27/19  2148     History   Chief Complaint Chief Complaint  Patient presents with   Neck Pain    HPI Lawrence Bonilla is a 68 y.o. male.  HPI: A 68 year old patient with a history of hypertension and hypercholesterolemia presents for evaluation of chest pain. Initial onset of pain was less than one hour ago. The patient's chest pain is sharp and is not worse with exertion. The patient's chest pain is not middle- or left-sided, is not well-localized, is not described as heaviness/pressure/tightness and does radiate to the arms/jaw/neck. The patient does not complain of nausea and denies diaphoresis. The patient has no history of stroke, has no history of peripheral artery disease, has not smoked in the past 90 days, denies any history of treated diabetes, has no relevant family history of coronary artery disease (first degree relative at less than age 70) and does not have an elevated BMI (>=30).   HPI  Past Medical History:  Diagnosis Date   Atrial fibrillation (HCC)    Congestive heart failure (CHF) (HCC)    DM (diabetes mellitus), type 2 (Lawrence Bonilla)    Essential hypertension    Hyperlipidemia    Hypothyroidism    Lymphedema    PVD (peripheral vascular disease) [facility])     Patient Active Problem List   Diagnosis Date Noted   Acute on chronic systolic CHF (congestive heart failure) ([place]) 01/28/2019   CKD (chronic kidney disease), stage IV ([place]) 01/28/2019   Pleural effusion on right 01/28/2019   Stroke ([place]) 01/28/2019   Supratherapeutic INR 01/28/2019   Atrial fibrillation ([place]) 01/17/2019   Encounter for therapeutic drug monitoring 01/17/2019    Past Surgical History:  Procedure Laterality Date   APPENDECTOMY     BARIATRIC SURGERY          Home Medications    Prior to Admission medications   Medication Sig Start Date End Date Taking? Authorizing Provider    atorvastatin (LIPITOR) 40 MG tablet Take 40 mg by mouth daily.   Yes [provider]  furosemide (LASIX) 40 MG tablet Take 80 mg am ( 2 tablets) and 40 mg pm (1 tablet) 01/25/19  Yes [name], MD  isosorbide mononitrate (IMDUR) 30 MG 24 hr tablet Take 30 mg by mouth daily.   Yes [provider]  lisinopril (ZESTRIL) 10 MG tablet Take 10 mg by mouth 2 (two) times daily.    Yes [provider]  metoprolol succinate (TOPROL-XL) 100 MG 24 hr tablet Take 100 mg by mouth 2 (two) times daily. Take with or immediately following a meal.   Yes [provider]  NP THYROID 30 MG tablet TK 1 T PO QD 12/23/18  Yes [provider]  sacubitril-valsartan (ENTRESTO) 24-26 MG Take 1 tablet by mouth 2 (two) times daily. 01/25/19  Yes [name], MD  Vitamin D, Ergocalciferol, (DRISDOL) 1.25 MG (50000 UT) CAPS capsule Take 1 capsule by mouth once a week. 01/11/19  Yes [provider]  warfarin (COUMADIN) 2.5 MG tablet Take 2.5 mg by mouth daily. TWO TABLETS DAILY EXCEPT ONE TABLET ON TUESDAYS & THURSDAYS.   Yes [provider]    Family History Family History  Problem Relation Age of Onset   Asthma Mother     Social History Social History   Tobacco Use   Smoking status: Never Smoker   Smokeless tobacco: Never Used  Substance Use  Topics   Alcohol use: Yes    Comment: 2 SCOTCH    Drug use: Never     Allergies   Patient has no known allergies.   Review of Systems Review of Systems  Constitutional: Positive for activity change.  Respiratory: Positive for shortness of breath.   Cardiovascular: Positive for chest pain.  Neurological: Negative for headaches.  Hematological: Bruises/bleeds easily.  All other systems reviewed and are negative.    Physical Exam Updated Vital Signs BP (!) 145/112 (BP Location: Right Arm)    Pulse 69    Temp 97.8 F (36.6 C) (Oral)    Resp 14    Ht 6' (1.829 m)    Wt 91.7 kg    SpO2 100%     BMI 27.42 kg/m   Physical Exam Vitals signs and nursing note reviewed.  Constitutional:      Appearance: He is well-developed.  HENT:     Head: Atraumatic.     Comments: No carotid bruits. No nuchal rigidity. Eyes:     Extraocular Movements: Extraocular movements intact.     Pupils: Pupils are equal, round, and reactive to light.  Neck:     Musculoskeletal: Neck supple.  Cardiovascular:     Heart sounds: Murmur present.  Pulmonary:     Effort: Pulmonary effort is normal.  Skin:    General: Skin is warm.  Neurological:     General: No focal deficit present.     Mental Status: He is alert and oriented to person, place, and time.     Cranial Nerves: No cranial nerve deficit.     Sensory: No sensory deficit.     Motor: No weakness.     Coordination: Coordination normal.     Gait: Gait normal.      ED Treatments / Results  Labs (all labs ordered are listed, but only abnormal results are displayed) Labs Reviewed  BASIC METABOLIC PANEL - Abnormal; Notable for the following components:      Result Value   Glucose, Bld 134 (*)    BUN 57 (*)    Creatinine, Ser 2.41 (*)    Calcium 8.8 (*)    GFR calc non Af Amer 27 (*)    GFR calc Af Amer 31 (*)    All other components within normal limits  CBC WITH DIFFERENTIAL/PLATELET - Abnormal; Notable for the following components:   MCV 109.2 (*)    MCH 34.6 (*)    Platelets 126 (*)    Lymphs Abs 0.3 (*)    All other components within normal limits  APTT - Abnormal; Notable for the following components:   aPTT 50 (*)    All other components within normal limits  PROTIME-INR - Abnormal; Notable for the following components:   Prothrombin Time 46.1 (*)    INR 5.1 (*)    All other components within normal limits  BRAIN NATRIURETIC PEPTIDE - Abnormal; Notable for the following components:   B Natriuretic Peptide >4,500.0 (*)    All other components within normal limits  TROPONIN I (HIGH SENSITIVITY) - Abnormal; Notable for the  following components:   Troponin I (High Sensitivity) 18 (*)    All other components within normal limits  TROPONIN I (HIGH SENSITIVITY) - Abnormal; Notable for the following components:   Troponin I (High Sensitivity) 18 (*)    All other components within normal limits  TROPONIN I (HIGH SENSITIVITY) - Abnormal; Notable for the following components:   Troponin I (High Sensitivity) 18 (*)  All other components within normal limits  CULTURE, BLOOD (ROUTINE X 2)  CULTURE, BLOOD (ROUTINE X 2)  SARS CORONAVIRUS 2 (TAT 6-24 HRS)  MAGNESIUM  SEDIMENTATION RATE  TSH  STREP PNEUMONIAE URINARY ANTIGEN  LEGIONELLA PNEUMOPHILA SEROGP 1 UR AG  COMPREHENSIVE METABOLIC PANEL  CBC  HIV ANTIBODY (ROUTINE TESTING W REFLEX)  PROTIME-INR  CBG MONITORING, ED    EKG EKG Interpretation  Date/Time:  Monday January 28 2019 01:00:24 EDT Ventricular Rate:  82 PR Interval:    QRS Duration: 109 QT Interval:  435 QTC Calculation: 509 R Axis:   -175 Text Interpretation:  Atrial fibrillation Probable right ventricular hypertrophy ST elevation, consider inferior injury Prolonged QT interval No acute changes No old tracing to compare Confirmed by Varney Biles [phone]) on 01/28/2019 1:47:44 AM   Radiology Ct Head Wo Contrast  Result Date: 01/28/2019 CLINICAL DATA:  Ataxia, fall 5 days ago EXAM: CT HEAD WITHOUT CONTRAST; CT CERVICAL SPINE WITHOUT CONTRAST TECHNIQUE: Contiguous axial images were obtained from the base of the skull through the vertex without intravenous contrast. COMPARISON:  MRI March 29, 2017 FINDINGS: Brain: There is a area of hypodensity seen within the posterior right occipital lobe. There is dilatation the ventricles and sulci consistent with age-related atrophy. There patchy areas of low-attenuation changes in the deep white matter consistent with small vessel ischemia. No extra-axial collections. Vascular: No hyperdense vessel or unexpected calcification. Skull: The skull is intact.  No fracture or focal lesion identified. Sinuses/Orbits: The visualized paranasal sinuses and mastoid air cells are clear. The orbits and globes intact. Other: None Cervical spine: Alignment: Physiologic Skull base and vertebrae: Visualized skull base is intact. No atlanto-occipital dissociation. The vertebral body heights are well maintained. No fracture or pathologic osseous lesion seen. Soft tissues and spinal canal: The visualized paraspinal soft tissues are unremarkable. No prevertebral soft tissue swelling is seen. The spinal canal is grossly unremarkable, no large epidural collection or significant canal narrowing. Disc levels: Degenerative changes are seen most notable at C5-C6 with disc osteophyte complex and uncovertebral osteophytes. Upper chest: The lung apices are clear. Thoracic inlet is within normal limits. Other: None IMPRESSION: 1. Age-indeterminate infarct of the posterior right medial occipital lobe. 2. No acute fracture or malalignment of the cervical spine. 3. Findings consistent with age related atrophy and chronic small vessel ischemia Electronically Signed   By: Prudencio Pair M.D.   On: 01/28/2019 02:42   Ct Chest Wo Contrast  Result Date: 01/28/2019 CLINICAL DATA:  Right pleural effusion.  Weight loss. EXAM: CT CHEST WITHOUT CONTRAST TECHNIQUE: Multidetector CT imaging of the chest was performed following the standard protocol without IV contrast. COMPARISON:  Chest x-ray from earlier today FINDINGS: Cardiovascular: Cardiomegaly. No pericardial effusion. Aortic and coronary atherosclerosis. Mediastinum/Nodes: Negative for adenopathy or mass. Lungs/Pleura: Moderate right pleural effusion that is low-density and dependent. Small left pleural effusion. Multi segment atelectasis mainly in the right lower lung. No pulmonary edema. No definite consolidation. No masslike findings. Upper Abdomen: Gastric bypass. Renal cystic densities which are partially covered. There is likely also a  proteinaceous cyst in the upper pole left kidney. Musculoskeletal: Degenerative spondylosis with multi-level ankylosis. There is exaggerated thoracic kyphosis. No acute finding Other: Gynecomastia. IMPRESSION: 1. Moderate right and small left pleural effusion which is dependent and simple appearing. 2. Right-sided atelectasis. 3. Cardiomegaly and atherosclerosis. Electronically Signed   By: Monte Fantasia M.D.   On: 01/28/2019 08:18   Ct Cervical Spine Wo Contrast  Result Date: 01/28/2019 CLINICAL DATA:  Ataxia,  fall 5 days ago EXAM: CT HEAD WITHOUT CONTRAST; CT CERVICAL SPINE WITHOUT CONTRAST TECHNIQUE: Contiguous axial images were obtained from the base of the skull through the vertex without intravenous contrast. COMPARISON:  MRI March 29, 2017 FINDINGS: Brain: There is a area of hypodensity seen within the posterior right occipital lobe. There is dilatation the ventricles and sulci consistent with age-related atrophy. There patchy areas of low-attenuation changes in the deep white matter consistent with small vessel ischemia. No extra-axial collections. Vascular: No hyperdense vessel or unexpected calcification. Skull: The skull is intact. No fracture or focal lesion identified. Sinuses/Orbits: The visualized paranasal sinuses and mastoid air cells are clear. The orbits and globes intact. Other: None Cervical spine: Alignment: Physiologic Skull base and vertebrae: Visualized skull base is intact. No atlanto-occipital dissociation. The vertebral body heights are well maintained. No fracture or pathologic osseous lesion seen. Soft tissues and spinal canal: The visualized paraspinal soft tissues are unremarkable. No prevertebral soft tissue swelling is seen. The spinal canal is grossly unremarkable, no large epidural collection or significant canal narrowing. Disc levels: Degenerative changes are seen most notable at C5-C6 with disc osteophyte complex and uncovertebral osteophytes. Upper chest: The lung  apices are clear. Thoracic inlet is within normal limits. Other: None IMPRESSION: 1. Age-indeterminate infarct of the posterior right medial occipital lobe. 2. No acute fracture or malalignment of the cervical spine. 3. Findings consistent with age related atrophy and chronic small vessel ischemia Electronically Signed   By: Prudencio Pair M.D.   On: 01/28/2019 02:42   US Carotid Bilateral  Result Date: 01/28/2019 CLINICAL DATA:  Hypertension and hyperlipidemia. EXAM: BILATERAL CAROTID DUPLEX ULTRASOUND TECHNIQUE: Pearline Cables scale imaging, color Doppler and duplex ultrasound were performed of bilateral carotid and vertebral arteries in the neck. COMPARISON:  None. FINDINGS: Criteria: Quantification of carotid stenosis is based on velocity parameters that correlate the residual internal carotid diameter with NASCET-based stenosis levels, using the diameter of the distal internal carotid lumen as the denominator for stenosis measurement. The following velocity measurements were obtained: RIGHT ICA: 74/23 cm/sec CCA: 0000000 cm/sec SYSTOLIC ICA/CCA RATIO:  2.2 ECA: 42 cm/sec LEFT ICA: 52/17 cm/sec CCA: [id number] cm/sec SYSTOLIC ICA/CCA RATIO:  1.0 ECA: 36 cm/sec RIGHT CAROTID ARTERY: There is no grayscale evidence of significant intimal thickening or atherosclerotic plaque affecting the interrogated portions of the right carotid system. There are no elevated peak systolic velocities within the interrogated course of the right internal carotid artery to suggest a hemodynamically significant stenosis. RIGHT VERTEBRAL ARTERY:  Antegrade flow LEFT CAROTID ARTERY: There is no grayscale evidence of significant intimal thickening or atherosclerotic plaque affecting the interrogated portions of the left carotid system. There are no elevated peak systolic velocities within the interrogated course of the left internal carotid artery to suggest a hemodynamically significant stenosis. LEFT VERTEBRAL ARTERY:  Antegrade flow IMPRESSION:  Unremarkable carotid Doppler ultrasound. Electronically Signed   By: Sandi Mariscal M.D.   On: 01/28/2019 10:01   Dg Chest Port 1 View  Result Date: 01/28/2019 CLINICAL DATA:  Bilateral pleural effusions and atelectasis. EXAM: PORTABLE CHEST 1 VIEW COMPARISON:  Chest radiograph 01/28/2019, chest CT 01/28/2019 FINDINGS: Stable cardiomediastinal contours with enlarged heart size. Persistent right basilar hazy opacity likely a combination of atelectasis and pleural effusion. The left lung is clear. No definite large left pleural effusion. No pneumothorax. No acute finding in the visualized skeleton. IMPRESSION: Persistent right basilar opacity likely a combination of atelectasis and pleural effusion. Electronically Signed   By: Jac Canavan.D.  On: 01/28/2019 19:29   Dg Chest Portable 1 View  Result Date: 01/28/2019 CLINICAL DATA:  Initial evaluation for acute chest pain. EXAM: PORTABLE CHEST 1 VIEW COMPARISON:  Prior radiograph from 08/25/2018. FINDINGS: Exaggeration of the cardiac silhouette related to AP technique and low lung volumes. Mediastinal silhouette normal. Lungs hypoinflated. Diffuse pulmonary vascular congestion without frank pulmonary edema. Moderate right pleural effusion with associated right basilar opacity, which could reflect atelectasis or infiltrate. Additional trace left pleural effusion with associated mild left basilar subsegmental atelectasis. No pneumothorax. No acute osseous finding. IMPRESSION: 1. Moderate right pleural effusion with associated right basilar opacity, which could reflect atelectasis or infiltrate. 2. Additional trace left pleural effusion with associated left basilar subsegmental atelectasis. 3. Underlying mild diffuse pulmonary interstitial congestion without frank pulmonary edema. Electronically Signed   By: Jeannine Boga M.D.   On: 01/28/2019 00:12    Procedures .Critical Care Performed by: Varney Biles, MD Authorized by: Varney Biles, MD    Critical care provider statement:    Critical care time (minutes):  33   Critical care was necessary to treat or prevent imminent or life-threatening deterioration of the following conditions:  Cardiac failure   Critical care was time spent personally by me on the following activities:  Discussions with consultants, evaluation of patient's response to treatment, examination of patient, ordering and performing treatments and interventions, ordering and review of laboratory studies, ordering and review of radiographic studies, pulse oximetry, re-evaluation of patient's condition, obtaining history from patient or surrogate and review of old charts   (including critical care time)  Medications Ordered in ED Medications  sodium chloride flush (NS) 0.9 % injection 3 mL (3 mLs Intravenous Given 01/28/19 2139)  sodium chloride flush (NS) 0.9 % injection 3 mL (has no administration in time range)  0.9 %  sodium chloride infusion (250 mLs Intravenous New Bag/Given 01/28/19 0828)  acetaminophen (TYLENOL) tablet 650 mg (has no administration in time range)    Or  acetaminophen (TYLENOL) suppository 650 mg (has no administration in time range)  atorvastatin (LIPITOR) tablet 40 mg (40 mg Oral Given 01/28/19 1653)  isosorbide mononitrate (IMDUR) 24 hr tablet 30 mg (30 mg Oral Given 01/28/19 1653)  metoprolol succinate (TOPROL-XL) 24 hr tablet 100 mg (100 mg Oral Given 01/28/19 2139)  sacubitril-valsartan (ENTRESTO) 24-26 mg per tablet (1 tablet Oral Given 01/28/19 2138)  thyroid (ARMOUR) tablet 30 mg (has no administration in time range)  furosemide (LASIX) injection 60 mg (60 mg Intravenous Given 01/28/19 1654)  cefTRIAXone (ROCEPHIN) 1 g in sodium chloride 0.9 % 100 mL IVPB (0 g Intravenous Stopped 01/28/19 0859)  azithromycin (ZITHROMAX) 500 mg in sodium chloride 0.9 % 250 mL IVPB (0 mg Intravenous Stopped 01/28/19 1248)  aspirin chewable tablet 324 mg (324 mg Oral Given 01/28/19 0053)  furosemide (LASIX) injection 80  mg (80 mg Intravenous Given 01/28/19 0501)     Initial Impression / Assessment and Plan / ED Course  I have reviewed the triage vital signs and the nursing notes.  Pertinent labs & imaging results that were available during my care of the patient were reviewed by me and considered in my medical decision making (see chart for details).     HEAR Score: 4  Patient comes in a chief complaint of neck pain.  He has history of CHF, CKD.  He recently established with: Cardiology.  He reports intermittent neck pain for the last few days, however the neck pain became constant and severe prior to ED arrival.  Patient has worsening pain with moving of the neck.  There is no new focal neurologic symptoms, but patient reports that his balance has gotten slightly weaker.  Additionally reports shortness of breath.  Prior to the neck pain, he was having right-sided chest pain.  Now the right-sided chest pain is resolved and the neck pain has persisted.  He denies any jaw claudication.  Based on this history we considered ACS, CHF exacerbation, meningitis, vertebral artery dissection, brain bleed in the differential diagnosis.  His neuro exam is completely nonfocal.  He has significant pitting edema, but that does not appear to be new.  Lab results are concerning for severe or worsening CHF.  Patient's creatinine is also elevated.  He has baseline CKD, but it is unclear if his creatinine has gotten worse.  He reports that his orthopnea and paroxysmal nocturnal dyspnea and functional status all have gotten worse over the past few days.  Cardiology team has try to tweak his medication based on his lab results.  Patient will be admitted for optimization of his advanced CHF.  He has EF of 20% with worsening of his symptoms. Might need PE workup if not responding to CHF tx. CT scan of the head and neck are negative, Doubt underlying neurovascular issue.  Final Clinical Impressions(s) / ED Diagnoses   Final  diagnoses:  Acute systolic congestive heart failure (HCC)  Acute renal failure superimposed on chronic kidney disease, unspecified CKD stage, unspecified acute renal failure type ([place])  Angina pectoris ([place])  Neck pain    ED Discharge Orders    None         Varney Biles, MD 02/14/19 1758

## 2019-01-28 NOTE — Progress Notes (Signed)
Patient's chart reviewed. Hemodynamically stable at this time and in no distress. Admitted after midnight secondary to CP and SOB; found with elevated troponin, vascular congestion on CXR and elevated BNP. Patient also with age undetermined stroke. Will be transfer to [facility] for further evaluation and management of acute on chronic systolic HF and CP. Cardiology service and MRI not available at [facility] at this point. Of note, Patient is chronically on coumadin and at this time INR is supertherapeutic. Please refer to H7P written by Dr. Maudie Mercury for further info/details on admission.    Plan: -cycler troponin and repeat EKG -continue IV diuresis and follow 2-D echo -follow recommendations by cardiology service -complete stroke work up as ordered by admitting physician.   Barton Dubois MD [phone]

## 2019-01-28 NOTE — Progress Notes (Addendum)
RN paged [facility] admitting on amion, that patient had arrived to [facility] from [facility] and needed an attending, admitting called back and told RN that Dr. Dyann Kief is still the attending for the patient, RN did  clarified that Dyann Kief was at [facility] and patient was here at [facility] but she said yes, he is the attending until 7 pm tonight.  RN paged Dr. Dyann Kief and he said he was not the attending, he is at [facility] and cannot see the patient so RN will have to paged [facility] team 2.  RN spoke with DR. Pahwani, about situation in MRI, gave verbal order for portable stat Xray.   Patient is currently stable in bed on 4L of O2 sats 96-98%.

## 2019-01-28 NOTE — Progress Notes (Signed)
ANTICOAGULATION CONSULT NOTE - Initial Up Consult   Pharmacy Consult for warfarin dosing  Indication: atrial fibrillation   No Known Allergies    Patient Measurements: Last Weight  Most recent update: 01/27/2019 10:01 PM   Weight  94.8 kg (209 lb)           Body mass index is 29.15 kg/m. Lawrence Bonilla               Temp: 97.9 F (36.6 C) (09/06 2200) Temp Source: Oral (09/06 2200) BP: 151/98 (09/07 0700) Pulse Rate: 64 (09/07 0700)  Labs: Recent Labs    01/28/19 0031  HGB 14.7  HCT 46.4  PLT 126*  APTT 50*  LABPROT 46.1*  INR 5.1*  CREATININE 2.41*    Estimated Creatinine Clearance: 34.5 mL/min (A) (by C-G formula based on SCr of 2.41 mg/dL (H)).     Medications:  (Not in a hospital admission)  Scheduled:  . furosemide  60 mg Intravenous BID  . sodium chloride flush  3 mL Intravenous Q12H   Infusions:  . sodium chloride    . azithromycin    . cefTRIAXone (ROCEPHIN)  IV     PRN: sodium chloride, acetaminophen **OR** acetaminophen, sodium chloride flush Anti-infectives (From admission, onward)   Start     Dose/Rate Route Frequency Ordered Stop   01/28/19 0700  cefTRIAXone (ROCEPHIN) 1 g in sodium chloride 0.9 % 100 mL IVPB     1 g 200 mL/hr over 30 Minutes Intravenous Every 24 hours 01/28/19 0656     01/28/19 0700  azithromycin (ZITHROMAX) 500 mg in sodium chloride 0.9 % 250 mL IVPB     500 mg 250 mL/hr over 60 Minutes Intravenous Every 24 hours 01/28/19 0656        Goal of Therapy:  INR 2-3 Monitor platelets by anticoagulation protocol: Yes    Prior to Admission Warfarin Dosing:     Monday:5mg   Tuesday:2.5mg   Wednesday:5mg   Thursday:2.5mg   Friday:5mg   Saturday:5mg   Sunday:5mg     Admit INR was 5.1 Lab Results  Component Value Date   INR 5.1 (HH) 01/28/2019   INR 2.6 01/17/2019    Assessment: Lawrence Bonilla a 68 y.o. male requires anticoagulation with warfarin for the indication of  atrial fibrillation. Warfarin will be  initiated inpatient following pharmacy protocol per pharmacy consult. Patient most recent blood work is as follows: CBC Latest Ref Rng & Units 01/28/2019  WBC 4.0 - 10.5 K/uL 8.3  Hemoglobin 13.0 - 17.0 g/dL 14.7  Hematocrit 39.0 - 52.0 % 46.4  Platelets 150 - 400 K/uL 126(L)     Plan: Holding warfarin today  Monitor CBC daily with am labs   Monitor INR daily Monitor for signs and symptoms of bleeding   Donna Christen Daisi Kentner, PharmD, MBA, BCGP Clinical Pharmacist

## 2019-01-29 ENCOUNTER — Inpatient Hospital Stay (HOSPITAL_COMMUNITY): Payer: 59

## 2019-01-29 ENCOUNTER — Encounter (HOSPITAL_COMMUNITY): Payer: Medicare HMO

## 2019-01-29 ENCOUNTER — Encounter (HOSPITAL_COMMUNITY): Payer: Self-pay | Admitting: General Practice

## 2019-01-29 DIAGNOSIS — I5023 Acute on chronic systolic (congestive) heart failure: Secondary | ICD-10-CM

## 2019-01-29 LAB — COMPREHENSIVE METABOLIC PANEL
ALT: 26 U/L (ref 0–44)
AST: 30 U/L (ref 15–41)
Albumin: 2.9 g/dL — ABNORMAL LOW (ref 3.5–5.0)
Alkaline Phosphatase: 121 U/L (ref 38–126)
Anion gap: 11 (ref 5–15)
BUN: 59 mg/dL — ABNORMAL HIGH (ref 8–23)
CO2: 24 mmol/L (ref 22–32)
Calcium: 8.8 mg/dL — ABNORMAL LOW (ref 8.9–10.3)
Chloride: 105 mmol/L (ref 98–111)
Creatinine, Ser: 2.61 mg/dL — ABNORMAL HIGH (ref 0.61–1.24)
GFR calc Af Amer: 28 mL/min — ABNORMAL LOW (ref >60)
GFR calc non Af Amer: 24 mL/min — ABNORMAL LOW (ref >60)
Glucose, Bld: 130 mg/dL — ABNORMAL HIGH (ref 70–99)
Potassium: 3.6 mmol/L (ref 3.5–5.1)
Sodium: 140 mmol/L (ref 135–145)
Total Bilirubin: 1.8 mg/dL — ABNORMAL HIGH (ref 0.3–1.2)
Total Protein: 6.2 g/dL — ABNORMAL LOW (ref 6.5–8.1)

## 2019-01-29 LAB — HEPARIN LEVEL (UNFRACTIONATED): Heparin Unfractionated: 0.15 IU/mL — ABNORMAL LOW (ref 0.30–0.70)

## 2019-01-29 LAB — PROTIME-INR
INR: 6.2 (ref 0.8–1.2)
INR: 5.6 (ref 0.8–1.2)
Prothrombin Time: 53.7 seconds — ABNORMAL HIGH (ref 11.4–15.2)
Prothrombin Time: 49.5 seconds — ABNORMAL HIGH (ref 11.4–15.2)

## 2019-01-29 LAB — CBC
HCT: 40.4 % (ref 39.0–52.0)
HCT: 41.6 % (ref 39.0–52.0)
Hemoglobin: 13.2 g/dL (ref 13.0–17.0)
Hemoglobin: 13.5 g/dL (ref 13.0–17.0)
MCH: 34.9 pg — ABNORMAL HIGH (ref 26.0–34.0)
MCH: 34.5 pg — ABNORMAL HIGH (ref 26.0–34.0)
MCHC: 32.7 g/dL (ref 30.0–36.0)
MCHC: 32.5 g/dL (ref 30.0–36.0)
MCV: 106.9 fL — ABNORMAL HIGH (ref 80.0–100.0)
MCV: 106.4 fL — ABNORMAL HIGH (ref 80.0–100.0)
Platelets: 106 10*3/uL — ABNORMAL LOW (ref 150–400)
Platelets: 122 10*3/uL — ABNORMAL LOW (ref 150–400)
RBC: 3.78 MIL/uL — ABNORMAL LOW (ref 4.22–5.81)
RBC: 3.91 MIL/uL — ABNORMAL LOW (ref 4.22–5.81)
RDW: 14.9 % (ref 11.5–15.5)
RDW: 14.7 % (ref 11.5–15.5)
WBC: 5.6 10*3/uL (ref 4.0–10.5)
WBC: 5.6 10*3/uL (ref 4.0–10.5)
nRBC: 0 % (ref 0.0–0.2)
nRBC: 0 % (ref 0.0–0.2)

## 2019-01-29 LAB — PROCALCITONIN: Procalcitonin: 0.12 ng/mL

## 2019-01-29 LAB — BRAIN NATRIURETIC PEPTIDE: B Natriuretic Peptide: >4500 pg/mL — ABNORMAL HIGH (ref 0.0–100.0)

## 2019-01-29 LAB — STREP PNEUMONIAE URINARY ANTIGEN: Strep Pneumo Urinary Antigen: NEGATIVE

## 2019-01-29 LAB — HIV ANTIBODY (ROUTINE TESTING W REFLEX): HIV Screen 4th Generation wRfx: NONREACTIVE

## 2019-01-29 LAB — SARS CORONAVIRUS 2 (TAT 6-24 HRS): SARS Coronavirus 2: NEGATIVE

## 2019-01-29 MED ORDER — INFLUENZA VAC A&B SA ADJ QUAD 0.5 ML IM PRSY
0.5000 mL | PREFILLED_SYRINGE | INTRAMUSCULAR | Status: AC
  Administered 2019-01-30: 13:00:00 0.5 mL via INTRAMUSCULAR
  Filled 2019-01-29: qty 0.5

## 2019-01-29 MED ORDER — WARFARIN - PHARMACIST DOSING INPATIENT: Freq: Every day | Status: DC

## 2019-01-29 MED ORDER — TECHNETIUM TO 99M ALBUMIN AGGREGATED
1.5300 | Freq: Once | INTRAVENOUS | Status: AC | PRN
  Administered 2019-01-29: 1.53 via INTRAVENOUS

## 2019-01-29 MED ORDER — PHYTONADIONE 5 MG PO TABS
2.5000 mg | ORAL_TABLET | Freq: Once | ORAL | Status: AC
  Administered 2019-01-29: 2.5 mg via ORAL
  Filled 2019-01-29: qty 1

## 2019-01-29 MED ORDER — HEPARIN (PORCINE) 25000 UT/250ML-% IV SOLN
1150.0000 [IU]/h | INTRAVENOUS | Status: DC
  Administered 2019-01-29: 1500 [IU]/h via INTRAVENOUS
  Administered 2019-01-30: 1800 [IU]/h via INTRAVENOUS
  Administered 2019-01-30: 21:00:00 1750 [IU]/h via INTRAVENOUS
  Administered 2019-01-31: 1250 [IU]/h via INTRAVENOUS
  Filled 2019-01-29 (×5): qty 250

## 2019-01-29 NOTE — Progress Notes (Signed)
   01/29/19 [id number]  Provider Notification  Provider Name/Title Lamar Blinks NP  Date Provider Notified 01/29/19  Time Provider Notified [phone]  Notification Type Page  Notification Reason Other (Comment) (INR=6.2)  Response  (awaiting call back.)

## 2019-01-29 NOTE — Progress Notes (Signed)
Received a call from radiology that his VQ scan is high probability for PE.  Consulted pharmacy to start heparin for PE.

## 2019-01-29 NOTE — Progress Notes (Signed)
ANTICOAGULATION CONSULT NOTE - [facility] for warfarin dosing  Indication: atrial fibrillation   No Known Allergies    Patient Measurements: Last Weight  Most recent update: 01/29/2019 12:52 AM   Weight  91.7 kg (202 lb 3.2 oz)           Body mass index is 27.42 kg/m. [name]               Temp: 97.6 F (36.4 C) (09/08 0553) Temp Source: Oral (09/08 0553) BP: 141/105 (09/08 0553) Pulse Rate: 65 (09/08 0553)  Labs: Recent Labs    01/28/19 0031 01/29/19 0509  HGB 14.7 13.2  HCT 46.4 40.4  PLT 126* 106*  APTT 50*  --   LABPROT 46.1* 53.7*  INR 5.1* 6.2*  CREATININE 2.41* 2.61*    Estimated Creatinine Clearance: 29.7 mL/min (A) (by C-G formula based on SCr of 2.61 mg/dL (H)).     Medications:  Medications Prior to Admission  Medication Sig Dispense Refill Last Dose  . atorvastatin (LIPITOR) 40 MG tablet Take 40 mg by mouth daily.   01/27/2019 at Unknown time  . furosemide (LASIX) 40 MG tablet Take 80 mg am ( 2 tablets) and 40 mg pm (1 tablet) 90 tablet 6 01/27/2019 at Unknown time  . isosorbide mononitrate (IMDUR) 30 MG 24 hr tablet Take 30 mg by mouth daily.   01/27/2019 at Unknown time  . lisinopril (ZESTRIL) 10 MG tablet Take 10 mg by mouth 2 (two) times daily.    01/27/2019 at Unknown time  . metoprolol succinate (TOPROL-XL) 100 MG 24 hr tablet Take 100 mg by mouth 2 (two) times daily. Take with or immediately following a meal.   01/27/2019 at 9 am  . NP THYROID 30 MG tablet TK 1 T PO QD   01/27/2019 at Unknown time  . sacubitril-valsartan (ENTRESTO) 24-26 MG Take 1 tablet by mouth 2 (two) times daily. 60 tablet 6 01/27/2019 at Unknown time  . Vitamin D, Ergocalciferol, (DRISDOL) 1.25 MG (50000 UT) CAPS capsule Take 1 capsule by mouth once a week.   01/27/2019 at Unknown time  . warfarin (COUMADIN) 2.5 MG tablet Take 2.5 mg by mouth daily. TWO TABLETS DAILY EXCEPT ONE TABLET ON TUESDAYS & THURSDAYS.   01/27/2019 at 9 am   Scheduled:  .  atorvastatin  40 mg Oral Daily  . furosemide  60 mg Intravenous BID  . isosorbide mononitrate  30 mg Oral Daily  . metoprolol succinate  100 mg Oral BID  . sacubitril-valsartan  1 tablet Oral BID  . sodium chloride flush  3 mL Intravenous Q12H  . thyroid  30 mg Oral QAC breakfast   Infusions:  . sodium chloride 250 mL (01/28/19 0828)  . azithromycin 500 mg (01/29/19 0655)  . cefTRIAXone (ROCEPHIN)  IV 1 g (01/29/19 [id number])   PRN: sodium chloride, acetaminophen **OR** acetaminophen, sodium chloride flush Anti-infectives (From admission, onward)   Start     Dose/Rate Route Frequency Ordered Stop   01/28/19 0700  cefTRIAXone (ROCEPHIN) 1 g in sodium chloride 0.9 % 100 mL IVPB     1 g 200 mL/hr over 30 Minutes Intravenous Every 24 hours 01/28/19 0656     01/28/19 0700  azithromycin (ZITHROMAX) 500 mg in sodium chloride 0.9 % 250 mL IVPB     500 mg 250 mL/hr over 60 Minutes Intravenous Every 24 hours 01/28/19 0656        Assessment: 82 yoM on warfarin PTA for PAF  admitted with acute HF. INR supratherapeutic at 5.1 on admit, now up to 6.2. Warfarin held last night, last dose at home on 9/6. Will continue to hold warfarin tonight.   Goal of Therapy: INR 2-3 Monitor platelets by anticoagulation protocol: Yes   Plan: -Hold warfarin again tonight -Daily protime -Monitor for S/Sx bleeding closely   Arrie Senate, PharmD, BCPS Clinical Pharmacist [phone] Please check AMION for all [facility] numbers 01/29/2019

## 2019-01-29 NOTE — Progress Notes (Addendum)
ANTICOAGULATION CONSULT NOTE - [facility] for warfarin dosing  Indication: atrial fibrillation   No Known Allergies    Patient Measurements: Last Weight  Most recent update: 01/29/2019 12:52 AM   Weight  91.7 kg (202 lb 3.2 oz)           Body mass index is 27.42 kg/m. Heparin dosing weight: 91.7 kg               Temp: 97.6 F (36.4 C) (09/08 1239) Temp Source: Oral (09/08 1239) BP: 141/108 (09/08 1239) Pulse Rate: 76 (09/08 1239)  Labs: Recent Labs    01/28/19 0031 01/29/19 0509  HGB 14.7 13.2  HCT 46.4 40.4  PLT 126* 106*  APTT 50*  --   LABPROT 46.1* 53.7*  INR 5.1* 6.2*  CREATININE 2.41* 2.61*    Estimated Creatinine Clearance: 29.7 mL/min (A) (by C-G formula based on SCr of 2.61 mg/dL (H)).     Medications:  Medications Prior to Admission  Medication Sig Dispense Refill Last Dose  . atorvastatin (LIPITOR) 40 MG tablet Take 40 mg by mouth daily.   01/27/2019 at Unknown time  . furosemide (LASIX) 40 MG tablet Take 80 mg am ( 2 tablets) and 40 mg pm (1 tablet) 90 tablet 6 01/27/2019 at Unknown time  . isosorbide mononitrate (IMDUR) 30 MG 24 hr tablet Take 30 mg by mouth daily.   01/27/2019 at Unknown time  . lisinopril (ZESTRIL) 10 MG tablet Take 10 mg by mouth 2 (two) times daily.    01/27/2019 at Unknown time  . metoprolol succinate (TOPROL-XL) 100 MG 24 hr tablet Take 100 mg by mouth 2 (two) times daily. Take with or immediately following a meal.   01/27/2019 at 9 am  . NP THYROID 30 MG tablet TK 1 T PO QD   01/27/2019 at Unknown time  . sacubitril-valsartan (ENTRESTO) 24-26 MG Take 1 tablet by mouth 2 (two) times daily. 60 tablet 6 01/27/2019 at Unknown time  . Vitamin D, Ergocalciferol, (DRISDOL) 1.25 MG (50000 UT) CAPS capsule Take 1 capsule by mouth once a week.   01/27/2019 at Unknown time  . warfarin (COUMADIN) 2.5 MG tablet Take 2.5 mg by mouth daily. TWO TABLETS DAILY EXCEPT ONE TABLET ON TUESDAYS & THURSDAYS.   01/27/2019 at 9 am   Scheduled:   . atorvastatin  40 mg Oral Daily  . furosemide  60 mg Intravenous BID  . [START ON 01/30/2019] influenza vaccine adjuvanted  0.5 mL Intramuscular Tomorrow-1000  . isosorbide mononitrate  30 mg Oral Daily  . metoprolol succinate  100 mg Oral BID  . sacubitril-valsartan  1 tablet Oral BID  . sodium chloride flush  3 mL Intravenous Q12H  . thyroid  30 mg Oral QAC breakfast  . Warfarin - Pharmacist Dosing Inpatient   Does not apply q1800   Infusions:  . sodium chloride 250 mL (01/28/19 0828)  . heparin     PRN: sodium chloride, acetaminophen **OR** acetaminophen, sodium chloride flush Anti-infectives (From admission, onward)   Start     Dose/Rate Route Frequency Ordered Stop   01/28/19 0700  cefTRIAXone (ROCEPHIN) 1 g in sodium chloride 0.9 % 100 mL IVPB  Status:  Discontinued     1 g 200 mL/hr over 30 Minutes Intravenous Every 24 hours 01/28/19 0656 01/29/19 1015   01/28/19 0700  azithromycin (ZITHROMAX) 500 mg in sodium chloride 0.9 % 250 mL IVPB  Status:  Discontinued     500 mg 250 mL/hr  over 60 Minutes Intravenous Every 24 hours 01/28/19 0656 01/29/19 1015      Assessment: 68 yr old male on warfarin PTA for PAF admitted with acute HF. INR supratherapeutic at 5.1 on admit, INR this AM up to 6.2; pt rec'd vitamin K 2.5 mg PO X 1 earlier today.. Warfarin held last night, last dose at home on 9/6. Will continue to hold warfarin tonight.  Now, VQ scan indicating high probability of PE. Per RN, no signs of bleeding observed.  INR at 17:10 this afternoon was 5.6 (pt rec'd 2 doses of azithromycin since admission, which may be contributing to elevated INR); spoke with Dr. Doristine Bosworth, who is not in favor of giving additional vitamin K at this point; he is in favor of continuing heparin infusion, but without boluses, and re-checking INR in the AM  Goal of Therapy: Heparin level goal: 0.3-0.7 units/ml INR goal: 2-3 Monitor platelets by anticoagulation protocol: Yes   Plan: Continue heparin  infusion at 1500 units/hr; no boluses due to elevated INR Check 6-hr heparin level, then daily Hold warfarin again tonight Daily protime Daily CBC Monitor for S/Sx bleeding closely  Gillermina Hu, PharmD, BCPS, [facility] Clinical Pharmacist 01/29/2019

## 2019-01-29 NOTE — Progress Notes (Deleted)
Patient alert and oriented, patient said he is leaving AMA, and does not want to talk to the doctor that he will leave and come back. MD notified, iv and tele removed.

## 2019-01-29 NOTE — Progress Notes (Signed)
PROGRESS NOTE    Lawrence Bonilla  [id number] DOB: [birth year] DOA: 01/27/2019 PCP: Doree Albee, MD   Brief Narrative:  Lawrence Bonilla  is a 68 y.o. male,  w hypertension, hyperlipidemia, Dm2 w neuropathy, hx of gastric bypass surgery, Pvd, Pafib, CHF, lymphedema who presented to a PED on early morning of 01/28/2019 with a complaint of 2 weeks of chest pain, 1 day of shoulder pain as well as shortness of breath for last couple of days.  He was found to have acute on chronic systolic congestive heart failure.  CT of the chest was done but that was without contrast and this confirmed CHF and possible pneumonia.  Patient had slightly elevated troponin level.  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  Patient was eventually transferred to [facility] in order to get MRI of the brain and see cardiologist.  Assessment & Plan:   Principal Problem:   Acute on chronic systolic CHF (congestive heart failure) ([place]) Active Problems:   Atrial fibrillation (HCC)   CKD (chronic kidney disease), stage IV (HCC)   Pleural effusion on right   Stroke ([place])   Supratherapeutic INR   Chest pain/elevated troponin: Patient's chest pain has resolved.  Troponins are only slightly elevated up to 18 and they are flat.  He had echo done just last month which showed systolic ejection fraction of 25%.  I doubt ACS and thus I do not think cardiology consultation is indicated at this point in time.  I will continue to monitor him on telemetry.  I wonder if he has PE.  Due to him having CKD, CT of the chest was done without contrast.  I will order VQ scan as well as duplex lower extremity ultrasound to rule out PE and DVT respectively.  Acute on chronic systolic congestive heart failure/acute hypoxic respiratory failure: Patient states that he does not use any oxygen at home.  He has been requiring 2 to 3 L of  oxygen.  Chest x-ray and CT scan shows congestive heart failure.  He is on 60 mg IV Lasix twice daily which I will continue.  Has good urine output.  Continue Entresto.  CKD stage III: At the patient, he does have some kidney disease but he does not know his baseline creatinine.  He has seen local nephrologist in [place] twice so far.  We do not have those records.  His creatinine upon admission was 2.41 and currently 2.61.  Presumably this is likely CKD which is stable.  Watch closely and avoid nephrotoxic agents for now.  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  He went to have an MRI yesterday however he could not lay flat due to congestive heart failure so study was aborted.  He is feeling better today.  Will attempt 1 more time.  He does not have any focal neurological deficit and he is not on any aspirin.  PAF on Coumadin: Currently he is in sinus rhythm.  Rate is controlled.  Continue Toprol-XL.  INR supratherapeutic at 6.1.  Due to acute ischemic stroke and the risk of hemorrhagic conversion, I will give him vitamin K 2.5 mg p.o. and hold Coumadin.  Repeat INR in the morning.  Monitor on telemetry.  Hyperlipidemia: Continue atorvastatin.  Community-acquired pneumonia: Chest x-ray and CT chest confirms pulmonary edema but no infiltrates.  He has no leukocytosis.  He is afebrile.  Procalcitonin at 0.12 is unremarkable.  He does not seem to have pneumonia.  I will discontinue Rocephin and Zithromax.  Essential hypertension: Controlled.  Continue current regimen.  DVT prophylaxis: On Coumadin.  INR supratherapeutic. Code Status: Full code Family Communication:  None present at bedside.  Plan of care discussed with patient in length and he verbalized understanding and agreed with it. Disposition Plan: TBD  Consultants:   None  Procedures:   None  Antimicrobials:   Received Rocephin and azithromycin on  01/28/2019   Subjective: Patient seen and examined.  Feels much better.  Minimal shortness of breath.  No more chest pain or any other complaint.  Objective: Vitals:   01/29/19 0050 01/29/19 0553 01/29/19 0858 01/29/19 0859  BP: (!) 145/108 (!) 141/105 (!) 130/100 (!) 129/98  Pulse: 69 65 64 66  Resp: 14 14    Temp: (!) 97.5 F (36.4 C) 97.6 F (36.4 C) (!) 97.5 F (36.4 C)   TempSrc: Oral Oral Oral   SpO2: 99% 99% 99%   Weight: 91.7 kg     Height:        Intake/Output Summary (Last 24 hours) at 01/29/2019 1007 Last data filed at 01/29/2019 0700 Gross per 24 hour  Intake 527 ml  Output 880 ml  Net -353 ml   Filed Weights   01/27/19 2201 01/28/19 1515 01/29/19 0050  Weight: 94.8 kg 91.7 kg 91.7 kg    Examination:  General exam: Appears calm and comfortable  Respiratory system: Crackles at the left base and diminished breath sounds at the right base, respiratory effort normal. Cardiovascular system: S1 & S2 heard, RRR. No JVD, murmurs, rubs, gallops or clicks.  Lymphedema with +1 pitting edema bilateral lower extremity.. Gastrointestinal system: Abdomen is nondistended, soft and nontender. No organomegaly or masses felt. Normal bowel sounds heard. Central nervous system: Alert and oriented. No focal neurological deficits. Extremities: Symmetric 5 x 5 power. Skin: No rashes, lesions or ulcers Psychiatry: Judgement and insight appear normal. Mood & affect appropriate.    Data Reviewed: I have personally reviewed following labs and imaging studies  CBC: Recent Labs  Lab 01/28/19 0031 01/29/19 0509  WBC 8.3 5.6  NEUTROABS 7.2  --   HGB 14.7 13.2  HCT 46.4 40.4  MCV 109.2* 106.9*  PLT 126* [id number]*   Basic Metabolic Panel: Recent Labs  Lab 01/28/19 0031 01/29/19 0509  NA 140 140  K 3.8 3.6  CL 104 105  CO2 24 24  GLUCOSE 134* 130*  BUN 57* 59*  CREATININE 2.41* 2.61*  CALCIUM 8.8* 8.8*  MG 2.4  --    GFR: Estimated Creatinine Clearance: 29.7 mL/min (A) (by  C-G formula based on SCr of 2.61 mg/dL (H)). Liver Function Tests: Recent Labs  Lab 01/29/19 0509  AST 30  ALT 26  ALKPHOS 121  BILITOT 1.8*  PROT 6.2*  ALBUMIN 2.9*   No results for input(s): LIPASE, AMYLASE in the last 168 hours. No results for input(s): AMMONIA in the last 168 hours. Coagulation Profile: Recent Labs  Lab 01/28/19 0031 01/29/19 0509  INR 5.1* 6.2*   Cardiac Enzymes: No results for input(s): CKTOTAL, CKMB, CKMBINDEX, TROPONINI in the last 168 hours. BNP (last 3 results) No results for input(s): PROBNP in the last 8760 hours. HbA1C: No results for input(s): HGBA1C in the last 72 hours. CBG: No results for input(s): GLUCAP in the last 168 hours. Lipid Profile: No results for input(s): CHOL, HDL, LDLCALC, TRIG, CHOLHDL, LDLDIRECT in  the last 72 hours. Thyroid Function Tests: Recent Labs    01/28/19 0711  TSH 3.511   Anemia Panel: No results for input(s): VITAMINB12, FOLATE, FERRITIN, TIBC, IRON, RETICCTPCT in the last 72 hours. Sepsis Labs: Recent Labs  Lab 01/29/19 0509  PROCALCITON 0.12    Recent Results (from the past 240 hour(s))  Novel Coronavirus, NAA ([facility])     Status: None   Collection Time: 01/22/19 11:30 AM   Specimen: Oropharyngeal(OP) collection in vial transport medium  Result Value Ref Range Status   SARS-CoV-2, NAA Not Detected Not Detected Final    Comment: This nucleic acid amplification test was developed and its perfomance characteristics determined by [employer]. Nucleic acid amplification tests include PCR and TMA. This test has not been FDA cleared or approved. This test has been authorized by FDA under an Emergency Use Authorization (EUA). This test is only authorized for the duration of time the declaration that circumstances exist justifying the authorization of the emergency use of in vitro diagnostic tests for detection of SARS-CoV-2 virus and/or diagnosis of COVID-19 infection under section 564(b)(1)  of the Act, 21 U.S.C. [id number]) (1), unless the authorization is terminated or revoked sooner. When diagnostic testing is negative, the possibility of a false negative result should be considered in the context of a patient's recent exposures and the presence of clinical signs and symptoms consistent with COVID-19. An individual without symptoms of COVID-19 and who is not shedding SARS-CoV-2 virus would  expect to have a negative (not detected) result in this assay.   SARS CORONAVIRUS 2 (TAT 6-24 HRS) Nasopharyngeal Nasopharyngeal Swab     Status: None   Collection Time: 01/28/19  1:24 AM   Specimen: Nasopharyngeal Swab  Result Value Ref Range Status   SARS Coronavirus 2 NEGATIVE NEGATIVE Final    Comment: (NOTE) SARS-CoV-2 target nucleic acids are NOT DETECTED. The SARS-CoV-2 RNA is generally detectable in upper and lower respiratory specimens during the acute phase of infection. Negative results do not preclude SARS-CoV-2 infection, do not rule out co-infections with other pathogens, and should not be used as the sole basis for treatment or other patient management decisions. Negative results must be combined with clinical observations, patient history, and epidemiological information. The expected result is Negative. Fact Sheet for Patients: SugarRoll.be Fact Sheet for Healthcare Providers: https://www.woods-mathews.com/ This test is not yet approved or cleared by the [place] FDA and  has been authorized for detection and/or diagnosis of SARS-CoV-2 by FDA under an Emergency Use Authorization (EUA). This EUA will remain  in effect (meaning this test can be used) for the duration of the COVID-19 declaration under Section 56 4(b)(1) of the Act, 21 U.S.C. section 360bbb-3(b)(1), unless the authorization is terminated or revoked sooner. Performed at [facility], [place] [address], [place], [place] [address]   Culture, blood  (routine x 2)     Status: None (Preliminary result)   Collection Time: 01/28/19  7:11 AM   Specimen: Right Antecubital; Blood  Result Value Ref Range Status   Specimen Description   Final    RIGHT ANTECUBITAL BOTTLES DRAWN AEROBIC AND ANAEROBIC   Special Requests Blood Culture adequate volume  Final   Culture   Final    NO GROWTH < 24 HOURS Performed at [facility], [address] [place],  [address]    Report Status PENDING  Incomplete  Culture, blood (routine x 2)     Status: None (Preliminary result)   Collection Time: 01/28/19  7:18 AM  Specimen: BLOOD LEFT HAND  Result Value Ref Range Status   Specimen Description   Final    BLOOD LEFT HAND BOTTLES DRAWN AEROBIC AND ANAEROBIC   Special Requests Blood Culture adequate volume  Final   Culture   Final    NO GROWTH < 24 HOURS Performed at [facility], [address], [place], [place] [address]    Report Status PENDING  Incomplete      Radiology Studies: Ct Head Wo Contrast  Result Date: 01/28/2019 CLINICAL DATA:  Ataxia, fall 5 days ago EXAM: CT HEAD WITHOUT CONTRAST; CT CERVICAL SPINE WITHOUT CONTRAST TECHNIQUE: Contiguous axial images were obtained from the base of the skull through the vertex without intravenous contrast. COMPARISON:  MRI March 29, 2017 FINDINGS: Brain: There is a area of hypodensity seen within the posterior right occipital lobe. There is dilatation the ventricles and sulci consistent with age-related atrophy. There patchy areas of low-attenuation changes in the deep white matter consistent with small vessel ischemia. No extra-axial collections. Vascular: No hyperdense vessel or unexpected calcification. Skull: The skull is intact. No fracture or focal lesion identified. Sinuses/Orbits: The visualized paranasal sinuses and mastoid air cells are clear. The orbits and globes intact. Other: None Cervical spine: Alignment: Physiologic Skull base and vertebrae: Visualized skull base is intact. No  atlanto-occipital dissociation. The vertebral body heights are well maintained. No fracture or pathologic osseous lesion seen. Soft tissues and spinal canal: The visualized paraspinal soft tissues are unremarkable. No prevertebral soft tissue swelling is seen. The spinal canal is grossly unremarkable, no large epidural collection or significant canal narrowing. Disc levels: Degenerative changes are seen most notable at C5-C6 with disc osteophyte complex and uncovertebral osteophytes. Upper chest: The lung apices are clear. Thoracic inlet is within normal limits. Other: None IMPRESSION: 1. Age-indeterminate infarct of the posterior right medial occipital lobe. 2. No acute fracture or malalignment of the cervical spine. 3. Findings consistent with age related atrophy and chronic small vessel ischemia Electronically Signed   By: Prudencio Pair M.D.   On: 01/28/2019 02:42   Ct Chest Wo Contrast  Result Date: 01/28/2019 CLINICAL DATA:  Right pleural effusion.  Weight loss. EXAM: CT CHEST WITHOUT CONTRAST TECHNIQUE: Multidetector CT imaging of the chest was performed following the standard protocol without IV contrast. COMPARISON:  Chest x-ray from earlier today FINDINGS: Cardiovascular: Cardiomegaly. No pericardial effusion. Aortic and coronary atherosclerosis. Mediastinum/Nodes: Negative for adenopathy or mass. Lungs/Pleura: Moderate right pleural effusion that is low-density and dependent. Small left pleural effusion. Multi segment atelectasis mainly in the right lower lung. No pulmonary edema. No definite consolidation. No masslike findings. Upper Abdomen: Gastric bypass. Renal cystic densities which are partially covered. There is likely also a proteinaceous cyst in the upper pole left kidney. Musculoskeletal: Degenerative spondylosis with multi-level ankylosis. There is exaggerated thoracic kyphosis. No acute finding Other: Gynecomastia. IMPRESSION: 1. Moderate right and small left pleural effusion which is  dependent and simple appearing. 2. Right-sided atelectasis. 3. Cardiomegaly and atherosclerosis. Electronically Signed   By: Monte Fantasia M.D.   On: 01/28/2019 08:18   Ct Cervical Spine Wo Contrast  Result Date: 01/28/2019 CLINICAL DATA:  Ataxia, fall 5 days ago EXAM: CT HEAD WITHOUT CONTRAST; CT CERVICAL SPINE WITHOUT CONTRAST TECHNIQUE: Contiguous axial images were obtained from the base of the skull through the vertex without intravenous contrast. COMPARISON:  MRI March 29, 2017 FINDINGS: Brain: There is a area of hypodensity seen within the posterior right occipital lobe. There is dilatation the ventricles and sulci consistent  with age-related atrophy. There patchy areas of low-attenuation changes in the deep white matter consistent with small vessel ischemia. No extra-axial collections. Vascular: No hyperdense vessel or unexpected calcification. Skull: The skull is intact. No fracture or focal lesion identified. Sinuses/Orbits: The visualized paranasal sinuses and mastoid air cells are clear. The orbits and globes intact. Other: None Cervical spine: Alignment: Physiologic Skull base and vertebrae: Visualized skull base is intact. No atlanto-occipital dissociation. The vertebral body heights are well maintained. No fracture or pathologic osseous lesion seen. Soft tissues and spinal canal: The visualized paraspinal soft tissues are unremarkable. No prevertebral soft tissue swelling is seen. The spinal canal is grossly unremarkable, no large epidural collection or significant canal narrowing. Disc levels: Degenerative changes are seen most notable at C5-C6 with disc osteophyte complex and uncovertebral osteophytes. Upper chest: The lung apices are clear. Thoracic inlet is within normal limits. Other: None IMPRESSION: 1. Age-indeterminate infarct of the posterior right medial occipital lobe. 2. No acute fracture or malalignment of the cervical spine. 3. Findings consistent with age related atrophy and  chronic small vessel ischemia Electronically Signed   By: Prudencio Pair M.D.   On: 01/28/2019 02:42   US Carotid Bilateral  Result Date: 01/28/2019 CLINICAL DATA:  Hypertension and hyperlipidemia. EXAM: BILATERAL CAROTID DUPLEX ULTRASOUND TECHNIQUE: Pearline Cables scale imaging, color Doppler and duplex ultrasound were performed of bilateral carotid and vertebral arteries in the neck. COMPARISON:  None. FINDINGS: Criteria: Quantification of carotid stenosis is based on velocity parameters that correlate the residual internal carotid diameter with NASCET-based stenosis levels, using the diameter of the distal internal carotid lumen as the denominator for stenosis measurement. The following velocity measurements were obtained: RIGHT ICA: 74/23 cm/sec CCA: 0000000 cm/sec SYSTOLIC ICA/CCA RATIO:  2.2 ECA: 42 cm/sec LEFT ICA: 52/17 cm/sec CCA: [id number] cm/sec SYSTOLIC ICA/CCA RATIO:  1.0 ECA: 36 cm/sec RIGHT CAROTID ARTERY: There is no grayscale evidence of significant intimal thickening or atherosclerotic plaque affecting the interrogated portions of the right carotid system. There are no elevated peak systolic velocities within the interrogated course of the right internal carotid artery to suggest a hemodynamically significant stenosis. RIGHT VERTEBRAL ARTERY:  Antegrade flow LEFT CAROTID ARTERY: There is no grayscale evidence of significant intimal thickening or atherosclerotic plaque affecting the interrogated portions of the left carotid system. There are no elevated peak systolic velocities within the interrogated course of the left internal carotid artery to suggest a hemodynamically significant stenosis. LEFT VERTEBRAL ARTERY:  Antegrade flow IMPRESSION: Unremarkable carotid Doppler ultrasound. Electronically Signed   By: Sandi Mariscal M.D.   On: 01/28/2019 10:01   Dg Chest Port 1 View  Result Date: 01/28/2019 CLINICAL DATA:  Bilateral pleural effusions and atelectasis. EXAM: PORTABLE CHEST 1 VIEW COMPARISON:  Chest  radiograph 01/28/2019, chest CT 01/28/2019 FINDINGS: Stable cardiomediastinal contours with enlarged heart size. Persistent right basilar hazy opacity likely a combination of atelectasis and pleural effusion. The left lung is clear. No definite large left pleural effusion. No pneumothorax. No acute finding in the visualized skeleton. IMPRESSION: Persistent right basilar opacity likely a combination of atelectasis and pleural effusion. Electronically Signed   By: Audie Pinto M.D.   On: 01/28/2019 19:29   Dg Chest Portable 1 View  Result Date: 01/28/2019 CLINICAL DATA:  Initial evaluation for acute chest pain. EXAM: PORTABLE CHEST 1 VIEW COMPARISON:  Prior radiograph from 08/25/2018. FINDINGS: Exaggeration of the cardiac silhouette related to AP technique and low lung volumes. Mediastinal silhouette normal. Lungs hypoinflated. Diffuse pulmonary vascular congestion without frank pulmonary  edema. Moderate right pleural effusion with associated right basilar opacity, which could reflect atelectasis or infiltrate. Additional trace left pleural effusion with associated mild left basilar subsegmental atelectasis. No pneumothorax. No acute osseous finding. IMPRESSION: 1. Moderate right pleural effusion with associated right basilar opacity, which could reflect atelectasis or infiltrate. 2. Additional trace left pleural effusion with associated left basilar subsegmental atelectasis. 3. Underlying mild diffuse pulmonary interstitial congestion without frank pulmonary edema. Electronically Signed   By: Jeannine Boga M.D.   On: 01/28/2019 00:12    Scheduled Meds:  atorvastatin  40 mg Oral Daily   furosemide  60 mg Intravenous BID   isosorbide mononitrate  30 mg Oral Daily   metoprolol succinate  100 mg Oral BID   phytonadione  2.5 mg Oral Once   sacubitril-valsartan  1 tablet Oral BID   sodium chloride flush  3 mL Intravenous Q12H   thyroid  30 mg Oral QAC breakfast   Warfarin - Pharmacist  Dosing Inpatient   Does not apply q1800   Continuous Infusions:  sodium chloride 250 mL (01/28/19 0828)   azithromycin 500 mg (01/29/19 0655)   cefTRIAXone (ROCEPHIN)  IV 1 g (01/29/19 0614)     LOS: 1 day   Time spent: 40 minutes   Darliss Cheney, MD [facility] Pager [phone]  If 7PM-7AM, please contact night-coverage [email or web address] Password [email or web address] 01/29/2019, 10:08 AM

## 2019-01-30 ENCOUNTER — Inpatient Hospital Stay (HOSPITAL_COMMUNITY): Payer: 59

## 2019-01-30 DIAGNOSIS — I2699 Other pulmonary embolism without acute cor pulmonale: Secondary | ICD-10-CM

## 2019-01-30 LAB — BASIC METABOLIC PANEL
Anion gap: 12 (ref 5–15)
BUN: 63 mg/dL — ABNORMAL HIGH (ref 8–23)
CO2: 22 mmol/L (ref 22–32)
Calcium: 8.6 mg/dL — ABNORMAL LOW (ref 8.9–10.3)
Chloride: 103 mmol/L (ref 98–111)
Creatinine, Ser: 2.23 mg/dL — ABNORMAL HIGH (ref 0.61–1.24)
GFR calc Af Amer: 34 mL/min — ABNORMAL LOW (ref >60)
GFR calc non Af Amer: 29 mL/min — ABNORMAL LOW (ref >60)
Glucose, Bld: 106 mg/dL — ABNORMAL HIGH (ref 70–99)
Potassium: 3.4 mmol/L — ABNORMAL LOW (ref 3.5–5.1)
Sodium: 137 mmol/L (ref 135–145)

## 2019-01-30 LAB — CBC
HCT: 46.2 % (ref 39.0–52.0)
Hemoglobin: 14.1 g/dL (ref 13.0–17.0)
MCH: 34.1 pg — ABNORMAL HIGH (ref 26.0–34.0)
MCHC: 30.5 g/dL (ref 30.0–36.0)
MCV: 111.9 fL — ABNORMAL HIGH (ref 80.0–100.0)
Platelets: 127 10*3/uL — ABNORMAL LOW (ref 150–400)
RBC: 4.13 MIL/uL — ABNORMAL LOW (ref 4.22–5.81)
RDW: 14.9 % (ref 11.5–15.5)
WBC: 5.6 10*3/uL (ref 4.0–10.5)
nRBC: 0 % (ref 0.0–0.2)

## 2019-01-30 LAB — PROTIME-INR
INR: 3.3 — ABNORMAL HIGH (ref 0.8–1.2)
Prothrombin Time: 33.2 seconds — ABNORMAL HIGH (ref 11.4–15.2)

## 2019-01-30 LAB — HEPARIN LEVEL (UNFRACTIONATED)
Heparin Unfractionated: 0.63 IU/mL (ref 0.30–0.70)
Heparin Unfractionated: 0.99 IU/mL — ABNORMAL HIGH (ref 0.30–0.70)

## 2019-01-30 MED ORDER — CALCIUM CARBONATE ANTACID 500 MG PO CHEW
1.0000 | CHEWABLE_TABLET | Freq: Once | ORAL | Status: AC
  Administered 2019-01-30: 03:00:00 200 mg via ORAL
  Filled 2019-01-30: qty 1

## 2019-01-30 MED ORDER — AMLODIPINE BESYLATE 5 MG PO TABS
5.0000 mg | ORAL_TABLET | Freq: Every day | ORAL | Status: DC
  Administered 2019-01-30 – 2019-02-01 (×3): 5 mg via ORAL
  Filled 2019-01-30 (×3): qty 1

## 2019-01-30 MED ORDER — FUROSEMIDE 10 MG/ML IJ SOLN
80.0000 mg | Freq: Two times a day (BID) | INTRAMUSCULAR | Status: DC
  Administered 2019-01-30 – 2019-02-01 (×5): 80 mg via INTRAVENOUS
  Filled 2019-01-30 (×5): qty 8

## 2019-01-30 NOTE — Progress Notes (Addendum)
ANTICOAGULATION CONSULT NOTE - [facility] for warfarin to heparin/apixaban  Indication: atrial fibrillation   No Known Allergies    Patient Measurements: Last Weight  Most recent update: 01/30/2019  3:24 AM   Weight  92.6 kg (204 lb 3.2 oz)           Body mass index is 27.69 kg/m. Heparin dosing weight: 91.7 kg               Temp: 98 F (36.7 C) (09/09 0819) Temp Source: Oral (09/09 0819) BP: 138/107 (09/09 0819) Pulse Rate: 63 (09/09 0819)  Labs: Recent Labs    01/28/19 0031 01/29/19 0509 01/29/19 1710 01/29/19 1909 01/29/19 2312 01/30/19 0330 01/30/19 0833  HGB 14.7 13.2  --  13.5  --  14.1  --   HCT 46.4 40.4  --  41.6  --  46.2  --   PLT 126* 106*  --  122*  --  127*  --   APTT 50*  --   --   --   --   --   --   LABPROT 46.1* 53.7* 49.5*  --   --   --   --   INR 5.1* 6.2* 5.6*  --   --   --   --   HEPARINUNFRC  --   --   --   --  0.15*  --  0.63  CREATININE 2.41* 2.61*  --   --   --   --   --     Estimated Creatinine Clearance: 29.7 mL/min (A) (by C-G formula based on SCr of 2.61 mg/dL (H)).     Medications:  Medications Prior to Admission  Medication Sig Dispense Refill Last Dose  . atorvastatin (LIPITOR) 40 MG tablet Take 40 mg by mouth daily.   01/27/2019 at Unknown time  . furosemide (LASIX) 40 MG tablet Take 80 mg am ( 2 tablets) and 40 mg pm (1 tablet) 90 tablet 6 01/27/2019 at Unknown time  . isosorbide mononitrate (IMDUR) 30 MG 24 hr tablet Take 30 mg by mouth daily.   01/27/2019 at Unknown time  . lisinopril (ZESTRIL) 10 MG tablet Take 10 mg by mouth 2 (two) times daily.    01/27/2019 at Unknown time  . metoprolol succinate (TOPROL-XL) 100 MG 24 hr tablet Take 100 mg by mouth 2 (two) times daily. Take with or immediately following a meal.   01/27/2019 at 9 am  . NP THYROID 30 MG tablet TK 1 T PO QD   01/27/2019 at Unknown time  . sacubitril-valsartan (ENTRESTO) 24-26 MG Take 1 tablet by mouth 2 (two) times daily. 60 tablet 6 01/27/2019  at Unknown time  . Vitamin D, Ergocalciferol, (DRISDOL) 1.25 MG (50000 UT) CAPS capsule Take 1 capsule by mouth once a week.   01/27/2019 at Unknown time  . warfarin (COUMADIN) 2.5 MG tablet Take 2.5 mg by mouth daily. TWO TABLETS DAILY EXCEPT ONE TABLET ON TUESDAYS & THURSDAYS.   01/27/2019 at 9 am   Scheduled:  . atorvastatin  40 mg Oral Daily  . furosemide  80 mg Intravenous BID  . influenza vaccine adjuvanted  0.5 mL Intramuscular Tomorrow-1000  . isosorbide mononitrate  30 mg Oral Daily  . metoprolol succinate  100 mg Oral BID  . sacubitril-valsartan  1 tablet Oral BID  . sodium chloride flush  3 mL Intravenous Q12H  . thyroid  30 mg Oral QAC breakfast  . Warfarin - Pharmacist Dosing Inpatient  Does not apply q1800   Infusions:  . sodium chloride 250 mL (01/28/19 0828)  . heparin 1,800 Units/hr (01/30/19 0755)   PRN: sodium chloride, acetaminophen **OR** acetaminophen, sodium chloride flush Anti-infectives (From admission, onward)   Start     Dose/Rate Route Frequency Ordered Stop   01/28/19 0700  cefTRIAXone (ROCEPHIN) 1 g in sodium chloride 0.9 % 100 mL IVPB  Status:  Discontinued     1 g 200 mL/hr over 30 Minutes Intravenous Every 24 hours 01/28/19 0656 01/29/19 1015   01/28/19 0700  azithromycin (ZITHROMAX) 500 mg in sodium chloride 0.9 % 250 mL IVPB  Status:  Discontinued     500 mg 250 mL/hr over 60 Minutes Intravenous Every 24 hours 01/28/19 0656 01/29/19 1015      Assessment: 72 yoM on warfarin PTA for PAF admitted with acute HF and CP. INR elevated on admit so warfarin was held and pt received vitamin K 2.5mg  PO x1 on 9/8. VQ scan demonstrated high-probability for PE on 9/8 pm so after discussion with Dr. Doristine Bosworth heparin infusion started despite supratherapeutic INR with concern for new VTE. Dopplers 9/9 negative for acute DVT.  This morning, heparin level is therapeutic at 0.63, INR down to 3.3. CBC is stable, no S/Sx bleeding noted by nursing. Given supratherapeutic  INR, will avoid boluses and reduce heparin level goal to 0.3-0.5 until INR drops <2.  Goal of Therapy: Heparin level goal: 0.3-0.5 units/ml INR goal: 2-3 Monitor platelets by anticoagulation protocol: Yes   Plan: -Continue to hold warfarin -Reduce heparin slightly to 1750 units/hr - will aim to keep heparin level ~0.3-0.5 as above -Check 8hr confirmatory heparin level -Daily heparin level, INR, CBC  ADDENDUM: MD wishes to transition warfarin to apixaban. Will continue with heparin plan noted above, once INR < 2 can transition heparin to apixaban.   Arrie Senate, PharmD, BCPS Clinical Pharmacist [phone] Please check AMION for all [facility] numbers 01/30/2019

## 2019-01-30 NOTE — Progress Notes (Signed)
LE venous duplex       has been completed. Preliminary results can be found under CV proc through chart review. Delona Clasby, BS, RDMS, RVT   

## 2019-01-30 NOTE — Progress Notes (Signed)
PT Cancellation Note  Patient Details Name: Lawrence Bonilla MRN: [id number] DOB: 1950/09/22   Cancelled Treatment:    Reason Eval/Treat Not Completed: Medical issues which prohibited therapy Pt with high probability for PE and per protocol, must wait 24 hours after receiving heparin. Heparin levels currently subtherapeutic. Will follow up as pt medically appropriate and as schedule allows.   Leighton Ruff, PT, DPT  Acute Rehabilitation Services  Pager: [phone] Office: [phone]  Rudean Hitt 01/30/2019, 10:38 AM

## 2019-01-30 NOTE — Progress Notes (Signed)
ANTICOAGULATION CONSULT NOTE - [facility] for warfarin to heparin/apixaban  Indication: atrial fibrillation, PE   No Known Allergies    Patient Measurements: Last Weight  Most recent update: 01/30/2019  3:24 AM   Weight  92.6 kg (204 lb 3.2 oz)           Body mass index is 27.69 kg/m. Heparin dosing weight: 91.7 kg               Temp: 97.5 F (36.4 C) (09/09 2130) Temp Source: Oral (09/09 2130) BP: 142/106 (09/09 2130) Pulse Rate: 71 (09/09 2130)  Labs: Recent Labs    01/28/19 0031 01/29/19 0509 01/29/19 1710 01/29/19 1909 01/29/19 2312 01/30/19 0330 01/30/19 0833 01/30/19 1147 01/30/19 2240  HGB 14.7 13.2  --  13.5  --  14.1  --   --   --   HCT 46.4 40.4  --  41.6  --  46.2  --   --   --   PLT 126* 106*  --  122*  --  127*  --   --   --   APTT 50*  --   --   --   --   --   --   --   --   LABPROT 46.1* 53.7* 49.5*  --   --   --  33.2*  --   --   INR 5.1* 6.2* 5.6*  --   --   --  3.3*  --   --   HEPARINUNFRC  --   --   --   --  0.15*  --  0.63  --  0.99*  CREATININE 2.41* 2.61*  --   --   --   --   --  2.23*  --     Estimated Creatinine Clearance: 34.8 mL/min (A) (by C-G formula based on SCr of 2.23 mg/dL (H)).     Medications:  Medications Prior to Admission  Medication Sig Dispense Refill Last Dose  . atorvastatin (LIPITOR) 40 MG tablet Take 40 mg by mouth daily.   01/27/2019 at Unknown time  . furosemide (LASIX) 40 MG tablet Take 80 mg am ( 2 tablets) and 40 mg pm (1 tablet) 90 tablet 6 01/27/2019 at Unknown time  . isosorbide mononitrate (IMDUR) 30 MG 24 hr tablet Take 30 mg by mouth daily.   01/27/2019 at Unknown time  . lisinopril (ZESTRIL) 10 MG tablet Take 10 mg by mouth 2 (two) times daily.    01/27/2019 at Unknown time  . metoprolol succinate (TOPROL-XL) 100 MG 24 hr tablet Take 100 mg by mouth 2 (two) times daily. Take with or immediately following a meal.   01/27/2019 at 9 am  . NP THYROID 30 MG tablet TK 1 T PO QD   01/27/2019 at  Unknown time  . sacubitril-valsartan (ENTRESTO) 24-26 MG Take 1 tablet by mouth 2 (two) times daily. 60 tablet 6 01/27/2019 at Unknown time  . Vitamin D, Ergocalciferol, (DRISDOL) 1.25 MG (50000 UT) CAPS capsule Take 1 capsule by mouth once a week.   01/27/2019 at Unknown time  . warfarin (COUMADIN) 2.5 MG tablet Take 2.5 mg by mouth daily. TWO TABLETS DAILY EXCEPT ONE TABLET ON TUESDAYS & THURSDAYS.   01/27/2019 at 9 am   Scheduled:  . amLODipine  5 mg Oral Daily  . atorvastatin  40 mg Oral Daily  . furosemide  80 mg Intravenous BID  . isosorbide mononitrate  30 mg Oral Daily  .  metoprolol succinate  100 mg Oral BID  . sacubitril-valsartan  1 tablet Oral BID  . sodium chloride flush  3 mL Intravenous Q12H  . thyroid  30 mg Oral QAC breakfast   Infusions:  . sodium chloride 250 mL (01/28/19 0828)  . heparin 1,750 Units/hr (01/30/19 2051)   PRN: sodium chloride, acetaminophen **OR** acetaminophen, sodium chloride flush Anti-infectives (From admission, onward)   Start     Dose/Rate Route Frequency Ordered Stop   01/28/19 0700  cefTRIAXone (ROCEPHIN) 1 g in sodium chloride 0.9 % 100 mL IVPB  Status:  Discontinued     1 g 200 mL/hr over 30 Minutes Intravenous Every 24 hours 01/28/19 0656 01/29/19 1015   01/28/19 0700  azithromycin (ZITHROMAX) 500 mg in sodium chloride 0.9 % 250 mL IVPB  Status:  Discontinued     500 mg 250 mL/hr over 60 Minutes Intravenous Every 24 hours 01/28/19 0656 01/29/19 1015      Assessment: 59 yoM on warfarin PTA for PAF admitted with acute HF and CP. INR elevated on admit so warfarin was held and pt received vitamin K 2.5mg  PO x1 on 9/8. VQ scan demonstrated high-probability for PE on 9/8 pm so after discussion with Dr. Doristine Bosworth heparin infusion started despite supratherapeutic INR with concern for new VTE. Dopplers 9/9 negative for acute DVT.  This morning, heparin level is therapeutic at 0.63, INR down to 3.3. CBC is stable, no S/Sx bleeding noted by nursing. Given  supratherapeutic INR, will avoid boluses and reduce heparin level goal to 0.3-0.5 until INR drops <2.  9/9 PM update:  Heparin level is elevated tonight, no issues per RN Last INR 3.3 Now with plans to DC heparin and start Apixaban once INR is below 2  Goal of Therapy: Heparin level goal: 0.3-0.5 units/ml INR goal: 2-3 Monitor platelets by anticoagulation protocol: Yes  Plan: -Dec heparin to 1500 units/hr - will aim to keep heparin level ~0.3-0.5 as above -Check heparin level in 8 hours -Daily heparin level, INR, CBC -Once INR is <2, can switch over to [place], PharmD, [place] Pharmacist Phone: [phone]

## 2019-01-30 NOTE — Progress Notes (Signed)
ANTICOAGULATION CONSULT NOTE - Follow Up Consult  Pharmacy Consult for heparin Indication: atrial fibrillation and possible acute PE despite supratherapeutic INR  Labs: Recent Labs    01/28/19 0031 01/28/19 0251 01/28/19 0523 01/29/19 0509 01/29/19 1710 01/29/19 1909 01/29/19 2312  HGB 14.7  --   --  13.2  --  13.5  --   HCT 46.4  --   --  40.4  --  41.6  --   PLT 126*  --   --  106*  --  122*  --   APTT 50*  --   --   --   --   --   --   LABPROT 46.1*  --   --  53.7* 49.5*  --   --   INR 5.1*  --   --  6.2* 5.6*  --   --   HEPARINUNFRC  --   --   --   --   --   --  0.15*  CREATININE 2.41*  --   --  2.61*  --   --   --   TROPONINIHS 18* 18* 18*  --   --   --   --     Assessment: 68yo male subtherapeutic on heparin with initial dosing for high probability of PE on VQ despite supratherapeutic INR; no gtt issues or signs of bleeding per RN; MD aware that INR is high and heparin infusion is running.  Goal of Therapy:  Heparin level 0.3-0.7 units/ml   Plan:  Will increase heparin gtt by 3 units/kg/hr to 1800 units/hr and check level in 8 hours.    Wynona Neat, PharmD, BCPS  01/30/2019,12:01 AM

## 2019-01-30 NOTE — Progress Notes (Signed)
PROGRESS NOTE    Lawrence Bonilla  [id number] DOB: 1950-12-03 DOA: 01/27/2019 PCP: Doree Albee, MD   Brief Narrative:  Lawrence Bonilla  is a 68 y.o. male,  w hypertension, hyperlipidemia, Dm2 w neuropathy, hx of gastric bypass surgery, Pvd, Pafib, CHF, lymphedema who presented to a PED on early morning of 01/28/2019 with a complaint of 2 weeks of chest pain, 1 day of shoulder pain as well as shortness of breath for last couple of days.  He was found to have acute on chronic systolic congestive heart failure.  CT of the chest was done but that was without contrast and this confirmed CHF and possible pneumonia.  Patient had slightly elevated troponin level.  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  Patient was eventually transferred to [facility] in order to get MRI of the brain and see cardiologist.  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  Patient does not have any focal neurological deficit.  Due to acute hypoxic respiratory failure and elevated troponin, PE was suspected.  Due to elevated creatinine, CT angiogram was not an option so VQ scan was obtained which was high probability.  Patient was then started on heparin drip and subsequently switched to Eliquis.  Assessment & Plan:   Principal Problem:   Acute on chronic systolic CHF (congestive heart failure) (HCC) Active Problems:   Atrial fibrillation (HCC)   CKD (chronic kidney disease), stage IV (HCC)   Pleural effusion on right   Stroke (HCC)   Supratherapeutic INR   Chest pain/elevated troponin: Patient's chest pain has resolved.  Troponins are only slightly elevated up to 18 and they are flat.  He had echo done just last month which showed systolic ejection fraction of 25%.  I doubt ACS and thus I do not think cardiology consultation is indicated at this point in time.  Interestingly and  surprisingly VQ scan on 01/29/2019 shows high probability of bilateral lower lobe PE despite of him having supratherapeutic INR.  I had a long discussion with the patient about anticoagulation choice and based on discussion and informed decision by the patient, we are switching him to Eliquis and I will order that per pharmacy protocol.  He understands risks and benefits and verbalized it.  Acute on chronic systolic congestive heart failure/acute hypoxic respiratory failure: Patient states that he does not use any oxygen at home.  Chest x-ray and CT scan shows congestive heart failure.  He is on 60 mg IV Lasix twice daily and although he has urine output but that is not good enough so I will increase it to 80 mg IV twice daily. ontinue Entresto.  Strict I's and O's.  Sodium restricted diet.  CKD stage III: At the patient, he does have some kidney disease but he does not know his baseline creatinine.  He has seen local nephrologist in [place] twice so far.  We do not have those records.  His creatinine upon admission was 2.41 and currently 2.61.  Presumably this is likely CKD which is stable.  Watch closely and avoid nephrotoxic agents for now.  BMP from this morning is still pending.  Acute ischemic infarct/stroke of posterior right medial occipital lobe: CT scan showed this is stroke.  Patient was transferred for MRI.  MRI was performed on 01/29/2019 which ruled out any acute stroke but did show chronic stroke.  According  to patient, he had a stroke 20 years ago.  PAF on Coumadin: Currently he is in sinus rhythm.  Rate is controlled.  Continue Toprol-XL.  INR 3.3.  Switch him to Eliquis.  Hyperlipidemia: Continue atorvastatin.  Community-acquired pneumonia: Chest x-ray and CT chest confirms pulmonary edema but no infiltrates.  He has no leukocytosis.  He is afebrile.  Procalcitonin at 0.12 is unremarkable.  He does not seem to have pneumonia.  I will discontinue Rocephin and Zithromax.  Essential  hypertension: Systolic within normal limit diastolic elevated.  Will continue current medications which include Imdur and metoprolol as well as Entresto and will add amlodipine 5 mg.  Generalized weakness: PT consulted.  DVT prophylaxis: Will be started on Eliquis today. Code Status: Full code Family Communication:  None present at bedside.  Plan of care discussed with patient in length and he verbalized understanding and agreed with it. Disposition Plan: TBD based on PT evaluation.  Potential discharge tomorrow.  Consultants:   None  Procedures:   None  Antimicrobials:   Received Rocephin and azithromycin on 01/28/2019   Subjective: Patient seen and examined.  Overall feels better.  Had some stomach upset last night which was relieved after he received Tums.  Breathing has improved.  No chest pain.  Objective: Vitals:   01/30/19 0045 01/30/19 0323 01/30/19 0353 01/30/19 0819  BP: (!) 143/100  (!) 154/112 (!) 138/107  Pulse: 65  73 63  Resp:   20 20  Temp: 98 F (36.7 C)  (!) 97.5 F (36.4 C) 98 F (36.7 C)  TempSrc: Oral  Oral Oral  SpO2: 98%  98% 95%  Weight:  92.6 kg    Height:        Intake/Output Summary (Last 24 hours) at 01/30/2019 1116 Last data filed at 01/30/2019 0700 Gross per 24 hour  Intake 659.09 ml  Output 500 ml  Net 159.09 ml   Filed Weights   01/28/19 1515 01/29/19 0050 01/30/19 0323  Weight: 91.7 kg 91.7 kg 92.6 kg    Examination:  General exam: Appears calm and comfortable  Respiratory system: Clear to auscultation with some diminished breath sounds at the bases bilaterally. Respiratory effort normal. Cardiovascular system: S1 & S2 heard, RRR. No JVD, murmurs, rubs, gallops or clicks. No pedal edema. Gastrointestinal system: Abdomen is nondistended, soft and nontender. No organomegaly or masses felt. Normal bowel sounds heard. Central nervous system: Alert and oriented. No focal neurological deficits. Extremities: Symmetric 5 x 5 power. Skin:  No rashes, lesions or ulcers.  Psychiatry: Judgement and insight appear normal. Mood & affect appropriate.   Data Reviewed: I have personally reviewed following labs and imaging studies  CBC: Recent Labs  Lab 01/28/19 0031 01/29/19 0509 01/29/19 1909 01/30/19 0330  WBC 8.3 5.6 5.6 5.6  NEUTROABS 7.2  --   --   --   HGB 14.7 13.2 13.5 14.1  HCT 46.4 40.4 41.6 46.2  MCV 109.2* 106.9* 106.4* 111.9*  PLT 126* 106* 122* [id number]   Basic Metabolic Panel: Recent Labs  Lab 01/28/19 0031 01/29/19 0509  NA 140 140  K 3.8 3.6  CL 104 105  CO2 24 24  GLUCOSE 134* 130*  BUN 57* 59*  CREATININE 2.41* 2.61*  CALCIUM 8.8* 8.8*  MG 2.4  --    GFR: Estimated Creatinine Clearance: 29.7 mL/min (A) (by C-G formula based on SCr of 2.61 mg/dL (H)). Liver Function Tests: Recent Labs  Lab 01/29/19 0509  AST 30  ALT 26  ALKPHOS 121  BILITOT 1.8*  PROT 6.2*  ALBUMIN 2.9*   No results for input(s): LIPASE, AMYLASE in the last 168 hours. No results for input(s): AMMONIA in the last 168 hours. Coagulation Profile: Recent Labs  Lab 01/28/19 0031 01/29/19 0509 01/29/19 1710 01/30/19 0833  INR 5.1* 6.2* 5.6* 3.3*   Cardiac Enzymes: No results for input(s): CKTOTAL, CKMB, CKMBINDEX, TROPONINI in the last 168 hours. BNP (last 3 results) No results for input(s): PROBNP in the last 8760 hours. HbA1C: No results for input(s): HGBA1C in the last 72 hours. CBG: No results for input(s): GLUCAP in the last 168 hours. Lipid Profile: No results for input(s): CHOL, HDL, LDLCALC, TRIG, CHOLHDL, LDLDIRECT in the last 72 hours. Thyroid Function Tests: Recent Labs    01/28/19 0711  TSH 3.511   Anemia Panel: No results for input(s): VITAMINB12, FOLATE, FERRITIN, TIBC, IRON, RETICCTPCT in the last 72 hours. Sepsis Labs: Recent Labs  Lab 01/29/19 0509  PROCALCITON 0.12    Recent Results (from the past 240 hour(s))  Novel Coronavirus, NAA ([facility])     Status: None   Collection Time:  01/22/19 11:30 AM   Specimen: Oropharyngeal(OP) collection in vial transport medium  Result Value Ref Range Status   SARS-CoV-2, NAA Not Detected Not Detected Final    Comment: This nucleic acid amplification test was developed and its perfomance characteristics determined by [employer]. Nucleic acid amplification tests include PCR and TMA. This test has not been FDA cleared or approved. This test has been authorized by FDA under an Emergency Use Authorization (EUA). This test is only authorized for the duration of time the declaration that circumstances exist justifying the authorization of the emergency use of in vitro diagnostic tests for detection of SARS-CoV-2 virus and/or diagnosis of COVID-19 infection under section 564(b)(1) of the Act, 21 U.S.C. PT:[id number]) (1), unless the authorization is terminated or revoked sooner. When diagnostic testing is negative, the possibility of a false negative result should be considered in the context of a patient's recent exposures and the presence of clinical signs and symptoms consistent with COVID-19. An individual without symptoms of COVID-19 and who is not shedding SARS-CoV-2 virus would  expect to have a negative (not detected) result in this assay.   SARS CORONAVIRUS 2 (TAT 6-24 HRS) Nasopharyngeal Nasopharyngeal Swab     Status: None   Collection Time: 01/28/19  1:24 AM   Specimen: Nasopharyngeal Swab  Result Value Ref Range Status   SARS Coronavirus 2 NEGATIVE NEGATIVE Final    Comment: (NOTE) SARS-CoV-2 target nucleic acids are NOT DETECTED. The SARS-CoV-2 RNA is generally detectable in upper and lower respiratory specimens during the acute phase of infection. Negative results do not preclude SARS-CoV-2 infection, do not rule out co-infections with other pathogens, and should not be used as the sole basis for treatment or other patient management decisions. Negative results must be combined with clinical  observations, patient history, and epidemiological information. The expected result is Negative. Fact Sheet for Patients: SugarRoll.be Fact Sheet for Healthcare Providers: https://www.woods-mathews.com/ This test is not yet approved or cleared by the [place] FDA and  has been authorized for detection and/or diagnosis of SARS-CoV-2 by FDA under an Emergency Use Authorization (EUA). This EUA will remain  in effect (meaning this test can be used) for the duration of the COVID-19 declaration under Section 56 4(b)(1) of the Act, 21 U.S.C. section 360bbb-3(b)(1), unless the authorization is terminated or revoked sooner. Performed at [facility], [place] [address], [place], [place] [address]  Culture, blood (routine x 2)     Status: None (Preliminary result)   Collection Time: 01/28/19  7:11 AM   Specimen: Right Antecubital; Blood  Result Value Ref Range Status   Specimen Description   Final    RIGHT ANTECUBITAL BOTTLES DRAWN AEROBIC AND ANAEROBIC   Special Requests Blood Culture adequate volume  Final   Culture   Final    NO GROWTH 2 DAYS Performed at [facility], [address], [place], [place] [address]    Report Status PENDING  Incomplete  Culture, blood (routine x 2)     Status: None (Preliminary result)   Collection Time: 01/28/19  7:18 AM   Specimen: BLOOD LEFT HAND  Result Value Ref Range Status   Specimen Description   Final    BLOOD LEFT HAND BOTTLES DRAWN AEROBIC AND ANAEROBIC   Special Requests Blood Culture adequate volume  Final   Culture   Final    NO GROWTH 2 DAYS Performed at [facility], [address] [place], [place] [address]    Report Status PENDING  Incomplete      Radiology Studies: Mr Brain Wo Contrast  Result Date: 01/29/2019 CLINICAL DATA:  Initial evaluation for acute ataxia, stroke suspected. EXAM: MRI HEAD WITHOUT CONTRAST TECHNIQUE: Multiplanar, multiecho pulse sequences of the brain and  surrounding structures were obtained without intravenous contrast. COMPARISON:  Prior CT from 01/28/2019. FINDINGS: Brain: Mild diffuse prominence of the CSF containing spaces compatible generalized age-related cerebral atrophy. Patchy and confluent T2/FLAIR hyperintensity within the periventricular and deep white matter both cerebral hemispheres as well as the pons, most consistent with chronic small vessel ischemic disease, moderate in nature. Superimposed encephalomalacia within the parasagittal right occipital lobe consistent with chronic right PCA territory infarct. No abnormal foci of restricted diffusion to suggest acute or subacute ischemia. Gray-white matter differentiation maintained. No other areas of chronic cortical infarction. No foci of susceptibility artifact to suggest acute or chronic intracranial hemorrhage. No mass lesion, midline shift or mass effect. Ventricles normal size without hydrocephalus. No extra-axial fluid collection. Pituitary gland within normal limits. Midline structures intact. Vascular: Major intracranial vascular flow voids are maintained. Skull and upper cervical spine: Craniocervical junction within normal limits. Visualized upper cervical spine normal. Bone marrow signal intensity within normal limits. No scalp soft tissue abnormality. Sinuses/Orbits: Patient status post bilateral ocular lens replacement. Globes and orbital soft tissues demonstrate no acute finding. Paranasal sinuses are clear. No mastoid effusion. Inner ear structures grossly normal. Other: None. IMPRESSION: 1. No acute intracranial abnormality identified. 2. Chronic right PCA territory infarct involving the parasagittal right occipital lobe. 3. Age-related cerebral atrophy with moderate chronic microvascular ischemic disease. Electronically Signed   By: Jeannine Boga M.D.   On: 01/29/2019 21:51   Nm Pulmonary Perfusion  Result Date: 01/29/2019 CLINICAL DATA:  Shortness of breath and chest pain  EXAM: NUCLEAR MEDICINE PERFUSION LUNG SCAN TECHNIQUE: Perfusion images were obtained in multiple projections after intravenous injection of radiopharmaceutical. Ventilation scans intentionally deferred if perfusion scan and chest x-ray adequate for interpretation during COVID 19 epidemic. RADIOPHARMACEUTICALS:  1.53 mCi Tc-93m MAA IV COMPARISON:  Chest radiograph January 28, 2019 FINDINGS: There are segmental level perfusion defects in each lower lobe, placing this study in a high probability category based on PIOPED II criteria. There is cardiomegaly. IMPRESSION: Segmental level perfusion defects in each lower lobe, a finding placing this study in a high probability category based on PIOPED II criteria. These results will be called to the ordering clinician or representative  by the Radiologist Assistant, and communication documented in the PACS or zVision Dashboard. Electronically Signed   By: Lowella Grip III M.D.   On: 01/29/2019 16:44   Dg Chest Port 1 View  Result Date: 01/28/2019 CLINICAL DATA:  Bilateral pleural effusions and atelectasis. EXAM: PORTABLE CHEST 1 VIEW COMPARISON:  Chest radiograph 01/28/2019, chest CT 01/28/2019 FINDINGS: Stable cardiomediastinal contours with enlarged heart size. Persistent right basilar hazy opacity likely a combination of atelectasis and pleural effusion. The left lung is clear. No definite large left pleural effusion. No pneumothorax. No acute finding in the visualized skeleton. IMPRESSION: Persistent right basilar opacity likely a combination of atelectasis and pleural effusion. Electronically Signed   By: Audie Pinto M.D.   On: 01/28/2019 19:29   Vas [place] Lower Extremity Venous (dvt)  Result Date: 01/30/2019  Lower Venous Study Indications: Pulmonary embolism.  Comparison Study: no prior Performing Technologist: June Leap RDMS, RVT  Examination Guidelines: A complete evaluation includes B-mode imaging, spectral Doppler, color Doppler, and power Doppler  as needed of all accessible portions of each vessel. Bilateral testing is considered an integral part of a complete examination. Limited examinations for reoccurring indications may be performed as noted.  +---------+---------------+---------+-----------+----------+--------------+  RIGHT     Compressibility Phasicity Spontaneity Properties Thrombus Aging  +---------+---------------+---------+-----------+----------+--------------+  CFV       Full            Yes       Yes                                    +---------+---------------+---------+-----------+----------+--------------+  SFJ       Full                                                             +---------+---------------+---------+-----------+----------+--------------+  FV Prox   Full                                                             +---------+---------------+---------+-----------+----------+--------------+  FV Mid    Full                                                             +---------+---------------+---------+-----------+----------+--------------+  FV Distal Full                                                             +---------+---------------+---------+-----------+----------+--------------+  PFV       Full                                                             +---------+---------------+---------+-----------+----------+--------------+  POP       Full            Yes       Yes                                    +---------+---------------+---------+-----------+----------+--------------+  PTV       Full                                                             +---------+---------------+---------+-----------+----------+--------------+  PERO      Full                                                             +---------+---------------+---------+-----------+----------+--------------+   +---------+---------------+---------+-----------+----------+--------------+  LEFT      Compressibility Phasicity Spontaneity Properties Thrombus  Aging  +---------+---------------+---------+-----------+----------+--------------+  CFV       Full            Yes       Yes                                    +---------+---------------+---------+-----------+----------+--------------+  SFJ       Full                                                             +---------+---------------+---------+-----------+----------+--------------+  FV Prox   Full                                                             +---------+---------------+---------+-----------+----------+--------------+  FV Mid    Full                                                             +---------+---------------+---------+-----------+----------+--------------+  FV Distal Full                                                             +---------+---------------+---------+-----------+----------+--------------+  PFV       Full                                                             +---------+---------------+---------+-----------+----------+--------------+  POP       Full            Yes       Yes                                    +---------+---------------+---------+-----------+----------+--------------+  PTV       Full                                                             +---------+---------------+---------+-----------+----------+--------------+  PERO      Full                                                             +---------+---------------+---------+-----------+----------+--------------+     Summary: Right: There is no evidence of deep vein thrombosis in the lower extremity. A cystic structure is found in the popliteal fossa. Left: There is no evidence of deep vein thrombosis in the lower extremity. A cystic structure is found in the popliteal fossa.  *See table(s) above for measurements and observations.    Preliminary     Scheduled Meds:  atorvastatin  40 mg Oral Daily   furosemide  80 mg Intravenous BID   influenza vaccine adjuvanted  0.5 mL Intramuscular  Tomorrow-1000   isosorbide mononitrate  30 mg Oral Daily   metoprolol succinate  100 mg Oral BID   sacubitril-valsartan  1 tablet Oral BID   sodium chloride flush  3 mL Intravenous Q12H   thyroid  30 mg Oral QAC breakfast   Continuous Infusions:  sodium chloride 250 mL (01/28/19 0828)   heparin 1,800 Units/hr (01/30/19 0755)     LOS: 2 days   Time spent: 31 minutes   Darliss Cheney, MD [facility] Pager [phone]  If 7PM-7AM, please contact night-coverage [email or web address] Password [email or web address] 01/30/2019, 11:16 AM

## 2019-01-31 ENCOUNTER — Inpatient Hospital Stay (HOSPITAL_COMMUNITY): Payer: 59

## 2019-01-31 LAB — COMPREHENSIVE METABOLIC PANEL
ALT: 27 U/L (ref 0–44)
AST: 34 U/L (ref 15–41)
Albumin: 3.2 g/dL — ABNORMAL LOW (ref 3.5–5.0)
Alkaline Phosphatase: 142 U/L — ABNORMAL HIGH (ref 38–126)
Anion gap: 14 (ref 5–15)
BUN: 66 mg/dL — ABNORMAL HIGH (ref 8–23)
CO2: 21 mmol/L — ABNORMAL LOW (ref 22–32)
Calcium: 8.9 mg/dL (ref 8.9–10.3)
Chloride: 103 mmol/L (ref 98–111)
Creatinine, Ser: 2.63 mg/dL — ABNORMAL HIGH (ref 0.61–1.24)
GFR calc Af Amer: 28 mL/min — ABNORMAL LOW (ref >60)
GFR calc non Af Amer: 24 mL/min — ABNORMAL LOW (ref >60)
Glucose, Bld: 143 mg/dL — ABNORMAL HIGH (ref 70–99)
Potassium: 3.7 mmol/L (ref 3.5–5.1)
Sodium: 138 mmol/L (ref 135–145)
Total Bilirubin: 2.4 mg/dL — ABNORMAL HIGH (ref 0.3–1.2)
Total Protein: 6.9 g/dL (ref 6.5–8.1)

## 2019-01-31 LAB — CBC
HCT: 46.6 % (ref 39.0–52.0)
Hemoglobin: 15.1 g/dL (ref 13.0–17.0)
MCH: 34.6 pg — ABNORMAL HIGH (ref 26.0–34.0)
MCHC: 32.4 g/dL (ref 30.0–36.0)
MCV: 106.9 fL — ABNORMAL HIGH (ref 80.0–100.0)
Platelets: 153 10*3/uL (ref 150–400)
RBC: 4.36 MIL/uL (ref 4.22–5.81)
RDW: 14.8 % (ref 11.5–15.5)
WBC: 5.9 10*3/uL (ref 4.0–10.5)
nRBC: 0 % (ref 0.0–0.2)

## 2019-01-31 LAB — BRAIN NATRIURETIC PEPTIDE: B Natriuretic Peptide: >4500 pg/mL — ABNORMAL HIGH (ref 0.0–100.0)

## 2019-01-31 LAB — HEPARIN LEVEL (UNFRACTIONATED)
Heparin Unfractionated: 0.79 IU/mL — ABNORMAL HIGH (ref 0.30–0.70)
Heparin Unfractionated: 0.6 IU/mL (ref 0.30–0.70)

## 2019-01-31 LAB — PROTIME-INR
INR: 2.1 — ABNORMAL HIGH (ref 0.8–1.2)
Prothrombin Time: 23 seconds — ABNORMAL HIGH (ref 11.4–15.2)

## 2019-01-31 MED ORDER — GUAIFENESIN-DM 100-10 MG/5ML PO SYRP
5.0000 mL | ORAL_SOLUTION | ORAL | Status: DC | PRN
  Administered 2019-01-31 – 2019-02-01 (×3): 5 mL via ORAL
  Filled 2019-01-31 (×3): qty 5

## 2019-01-31 NOTE — Evaluation (Signed)
Physical Therapy Evaluation Patient Details Name: Lawrence Bonilla MRN: [id number] DOB: [birth year] Today's Date: 01/31/2019   History of Present Illness  Lawrence Bonilla  is a 68 y.o. male,  w hypertension, hyperlipidemia, Dm2 w neuropathy, hx of gastric bypass surgery, Pvd, Pafib, CHF, lymphedema who presented to a PED on early morning of 01/28/2019 with a complaint of 2 weeks of chest pain, 1 day of shoulder pain as well as shortness of breath for last couple of days.  He was found to have acute on chronic systolic congestive heart failure.  CT of the chest was done but that was without contrast and this confirmed CHF and possible pneumonia.  Patient had slightly elevated troponin level.  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  Patient was eventually transferred to [facility] in order to get MRI of the brain and see cardiologist.  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  Clinical Impression  Pt admitted with/for SOB, CHF and further complications stated above.  Pt presently supervision to mod I and may need O2 for use at home temporarily,  Pt currently limited functionally due to the problems listed below.  (see problems list.)  Pt will benefit from PT to maximize function and safety to be able to get home safely with available assist.     Follow Up Recommendations Home health PT;Supervision/Assistance - 24 hour    Equipment Recommendations  Other (comment)(TBA)    Recommendations for Other Services       Precautions / Restrictions Precautions Precautions: Fall      Mobility  Bed Mobility Overal bed mobility: Modified Independent             General bed mobility comments: slower, but no assist  Transfers Overall transfer level: Needs assistance Equipment used: None Transfers: Sit to/from Stand Sit to Stand: Independent             Ambulation/Gait Ambulation/Gait assistance: Supervision Gait Distance (Feet): 10 Feet(to BR, then 100 to/from the hall way) Assistive device: None Gait Pattern/deviations: Step-through pattern Gait velocity: slower   General Gait Details: mostly steady, quickly fatiguing, becoming visibly dyspneic.  SpO2 on RA dropped to 81%, EHR upper 80's.  O2 applied and pt took 1.5-2 min to recoup to 90% on 4L.  Pt left on 2 L once recovered fully.  Stairs            Wheelchair Mobility    Modified Rankin (Stroke Patients Only)       Balance Overall balance assessment: Mild deficits observed, not formally tested                                           Pertinent Vitals/Pain Pain Assessment: Faces Faces Pain Scale: Hurts little more Pain Location: vague, chest pains, coughing discomfort. Pain Descriptors / Indicators: Discomfort;Sore;Radiating Pain Intervention(s): Monitored during session    Home Living Family/patient expects to be discharged to:: Private residence Living Arrangements: Spouse/significant other;Other (Comment)(and dtr/son in law) Available Help at Discharge: Family;Available 24 hours/day;Available PRN/intermittently(wife working at home) Type of Home: House Home Access: Stairs to enter Entrance Stairs-Rails: None(has support to hold to) Technical brewer of Steps: 3 Home Layout: One level Home Equipment: (TBA)      Prior Function Level of Independence: Independent with  assistive device(s);Independent         Comments: Independent ADL's/IADL's, ran errands. Could not walk distances required of [employer] well.     Hand Dominance        Extremity/Trunk Assessment   Upper Extremity Assessment Upper Extremity Assessment: Overall WFL for tasks assessed    Lower Extremity Assessment Lower Extremity Assessment: Generalized weakness;Overall WFL for tasks assessed       Communication   Communication: No difficulties   Cognition Arousal/Alertness: Awake/alert Behavior During Therapy: WFL for tasks assessed/performed Overall Cognitive Status: Within Functional Limits for tasks assessed                                        General Comments      Exercises     Assessment/Plan    PT Assessment Patient needs continued PT services  PT Problem List Decreased strength;Decreased activity tolerance;Decreased mobility;Cardiopulmonary status limiting activity;Pain       PT Treatment Interventions Gait training;Functional mobility training;Therapeutic activities;Patient/family education;DME instruction    PT Goals (Current goals can be found in the Care Plan section)  Acute Rehab PT Goals Patient Stated Goal: I'd like to go home for my rehab... PT Goal Formulation: With patient Time For Goal Achievement: 02/14/19 Potential to Achieve Goals: Good    Frequency Min 3X/week   Barriers to discharge        Co-evaluation               AM-PAC PT "6 Clicks" Mobility  Outcome Measure Help needed turning from your back to your side while in a flat bed without using bedrails?: None Help needed moving from lying on your back to sitting on the side of a flat bed without using bedrails?: None Help needed moving to and from a bed to a chair (including a wheelchair)?: None Help needed standing up from a chair using your arms (e.g., wheelchair or bedside chair)?: None Help needed to walk in hospital room?: A Little Help needed climbing 3-5 steps with a railing? : A Little 6 Click Score: 22    End of Session Equipment Utilized During Treatment: Oxygen Activity Tolerance: Patient limited by fatigue Patient left: in chair;with call bell/phone within reach Nurse Communication: Mobility status PT Visit Diagnosis: Unsteadiness on feet (R26.81);Difficulty in walking, not elsewhere classified (R26.2)    Time: 1301-1340 PT Time Calculation (min) (ACUTE ONLY): 39 min   Charges:   PT  Evaluation $PT Eval Moderate Complexity: 1 Mod PT Treatments $Gait Training: 8-22 mins $Therapeutic Activity: 8-22 mins        01/31/2019  Donnella Sham, PT Acute Rehabilitation Services [phone]  (pager) [phone]  (office)  Tessie Fass Alexiana Laverdure 01/31/2019, 1:56 PM

## 2019-01-31 NOTE — Progress Notes (Signed)
PROGRESS NOTE    Lawrence Bonilla  [id number] DOB: Jul 09, 1950 DOA: 01/27/2019 PCP: Doree Albee, MD   Brief Narrative:  Lawrence Bonilla  is a 68 y.o. male,  w hypertension, hyperlipidemia, Dm2 w neuropathy, hx of gastric bypass surgery, Pvd, Pafib, CHF, lymphedema who presented to a PED on early morning of 01/28/2019 with a complaint of 2 weeks of chest pain, 1 day of shoulder pain as well as shortness of breath for last couple of days.  He was found to have acute on chronic systolic congestive heart failure.  CT of the chest was done but that was without contrast and this confirmed CHF and possible pneumonia.  Patient had slightly elevated troponin level.  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  Patient was eventually transferred to [facility] in order to get MRI of the brain and see cardiologist.  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  Patient does not have any focal neurological deficit.  Due to acute hypoxic respiratory failure and elevated troponin, PE was suspected.  Due to elevated creatinine, CT angiogram was not an option so VQ scan was obtained which was high probability.  Patient was then started on heparin drip and subsequently switched to Eliquis.  Assessment & Plan:   Principal Problem:   Acute on chronic systolic CHF (congestive heart failure) (HCC) Active Problems:   Atrial fibrillation (HCC)   CKD (chronic kidney disease), stage IV (HCC)   Pleural effusion on right   Stroke (HCC)   Supratherapeutic INR   Chest pain/elevated troponin: Patient's chest pain has resolved.  Troponins are only slightly elevated up to 18 and they are flat.  He had echo done just last month which showed systolic ejection fraction of 25%.  I doubt ACS and thus I do not think cardiology consultation is indicated at this point in time.  Interestingly and  surprisingly VQ scan on 01/29/2019 shows high probability of bilateral lower lobe PE despite of him having supratherapeutic INR.  I had a long discussion with the patient about anticoagulation choice and based on discussion and informed decision by the patient, and he was switched to Eliquis today. He understands risks and benefits and verbalized it.  Acute on chronic systolic congestive heart failure/acute hypoxic respiratory failure: Patient states that he does not use any oxygen at home.  Chest x-ray and CT scan shows congestive heart failure.  Lasix increased to 80 mg IV twice daily yesterday.  He has had good diuresis.  He is now on room air.  Repeat chest x-ray today shows no improvement.  CKD stage III: At the patient, he does have some kidney disease but he does not know his baseline creatinine.  He has seen local nephrologist in [place] twice so far.  We do not have those records.  His creatinine upon admission was 2.41 and currently 2.63.  Presumably this is likely CKD which is stable.  Watch closely and avoid nephrotoxic agents for now.   Acute ischemic infarct/stroke of posterior right medial occipital lobe: CT scan showed this is stroke.  Patient was transferred for MRI.  MRI was performed on 01/29/2019 which ruled out any acute stroke but did show chronic stroke.  According to patient, he had a stroke 20 years ago.  PAF on Coumadin: Currently he is in sinus rhythm.  Rate is controlled.  Continue Toprol-XL.  Switch  to Eliquis today.  Hyperlipidemia: Continue atorvastatin.  Community-acquired pneumonia: Chest x-ray and CT chest confirms pulmonary edema but no infiltrates.  He has no leukocytosis.  He is afebrile.  Procalcitonin at 0.12 is unremarkable.  He does not seem to have pneumonia.  I will discontinue Rocephin and Zithromax.  Essential hypertension: Still with slightly elevated systolic but better than yesterday.  Increase amlodipine to 10 mg and continue  current medications which  include Imdur and metoprolol as well as Entresto.  Generalized weakness: Has yet to be seen by PT.  DVT prophylaxis: Will be started on Eliquis today. Code Status: Full code Family Communication:  None present at bedside.  Plan of care discussed with patient in length and he verbalized understanding and agreed with it. Disposition Plan: TBD based on PT evaluation.  Potential discharge tomorrow.  Consultants:   None  Procedures:   None  Antimicrobials:   Received Rocephin and azithromycin on 01/28/2019   Subjective: Seen and examined.  Still complains of intermittent shortness of breath but oxygen saturation maintaining over 90% on room air.  Also has generalized weakness.  No new complaint.  Objective: Vitals:   01/31/19 0458 01/31/19 0641 01/31/19 0914 01/31/19 1112  BP: (!) 147/113  (!) 151/102 (!) 143/99  Pulse: 71  78 62  Resp: 20  20 16   Temp: 97.7 F (36.5 C)  (!) 97.5 F (36.4 C) (!) 97.3 F (36.3 C)  TempSrc: Oral  Oral Oral  SpO2: 90%  92% 91%  Weight:  93 kg    Height:        Intake/Output Summary (Last 24 hours) at 01/31/2019 1300 Last data filed at 01/31/2019 1112 Gross per 24 hour  Intake 1063.88 ml  Output 1051 ml  Net 12.88 ml   Filed Weights   01/29/19 0050 01/30/19 0323 01/31/19 0641  Weight: 91.7 kg 92.6 kg 93 kg    Examination:  General exam: Appears calm and comfortable  Respiratory system: Clear to auscultation. Respiratory effort normal. Cardiovascular system: S1 & S2 heard, RRR. No JVD, murmurs, rubs, gallops or clicks. No pedal edema. Gastrointestinal system: Abdomen is nondistended, soft and nontender. No organomegaly or masses felt. Normal bowel sounds heard. Central nervous system: Alert and oriented. No focal neurological deficits. Extremities: Symmetric 5 x 5 power. Skin: No rashes, lesions or ulcers.  Psychiatry: Judgement and insight appear normal. Mood & affect appropriate.    Data Reviewed: I have personally reviewed  following labs and imaging studies  CBC: Recent Labs  Lab 01/28/19 0031 01/29/19 0509 01/29/19 1909 01/30/19 0330 01/31/19 0938  WBC 8.3 5.6 5.6 5.6 5.9  NEUTROABS 7.2  --   --   --   --   HGB 14.7 13.2 13.5 14.1 15.1  HCT 46.4 40.4 41.6 46.2 46.6  MCV 109.2* 106.9* 106.4* 111.9* 106.9*  PLT 126* 106* 122* 127* 0000000   Basic Metabolic Panel: Recent Labs  Lab 01/28/19 0031 01/29/19 0509 01/30/19 1147 01/31/19 0938  NA 140 140 137 138  K 3.8 3.6 3.4* 3.7  CL 104 105 103 103  CO2 24 24 22  21*  GLUCOSE 134* 130* 106* 143*  BUN 57* 59* 63* 66*  CREATININE 2.41* 2.61* 2.23* 2.63*  CALCIUM 8.8* 8.8* 8.6* 8.9  MG 2.4  --   --   --    GFR: Estimated Creatinine Clearance: 29.5 mL/min (A) (by C-G formula based on SCr of 2.63 mg/dL (H)). Liver Function Tests: Recent Labs  Lab 01/29/19 0509 01/31/19 0938  AST  30 34  ALT 26 27  ALKPHOS 121 142*  BILITOT 1.8* 2.4*  PROT 6.2* 6.9  ALBUMIN 2.9* 3.2*   No results for input(s): LIPASE, AMYLASE in the last 168 hours. No results for input(s): AMMONIA in the last 168 hours. Coagulation Profile: Recent Labs  Lab 01/28/19 0031 01/29/19 0509 01/29/19 1710 01/30/19 0833 01/31/19 0938  INR 5.1* 6.2* 5.6* 3.3* 2.1*   Cardiac Enzymes: No results for input(s): CKTOTAL, CKMB, CKMBINDEX, TROPONINI in the last 168 hours. BNP (last 3 results) No results for input(s): PROBNP in the last 8760 hours. HbA1C: No results for input(s): HGBA1C in the last 72 hours. CBG: No results for input(s): GLUCAP in the last 168 hours. Lipid Profile: No results for input(s): CHOL, HDL, LDLCALC, TRIG, CHOLHDL, LDLDIRECT in the last 72 hours. Thyroid Function Tests: No results for input(s): TSH, T4TOTAL, FREET4, T3FREE, THYROIDAB in the last 72 hours. Anemia Panel: No results for input(s): VITAMINB12, FOLATE, FERRITIN, TIBC, IRON, RETICCTPCT in the last 72 hours. Sepsis Labs: Recent Labs  Lab 01/29/19 0509  PROCALCITON 0.12    Recent Results  (from the past 240 hour(s))  Novel Coronavirus, NAA ([facility])     Status: None   Collection Time: 01/22/19 11:30 AM   Specimen: Oropharyngeal(OP) collection in vial transport medium  Result Value Ref Range Status   SARS-CoV-2, NAA Not Detected Not Detected Final    Comment: This nucleic acid amplification test was developed and its perfomance characteristics determined by [employer]. Nucleic acid amplification tests include PCR and TMA. This test has not been FDA cleared or approved. This test has been authorized by FDA under an Emergency Use Authorization (EUA). This test is only authorized for the duration of time the declaration that circumstances exist justifying the authorization of the emergency use of in vitro diagnostic tests for detection of SARS-CoV-2 virus and/or diagnosis of COVID-19 infection under section 564(b)(1) of the Act, 21 U.S.C. [id number]) (1), unless the authorization is terminated or revoked sooner. When diagnostic testing is negative, the possibility of a false negative result should be considered in the context of a patient's recent exposures and the presence of clinical signs and symptoms consistent with COVID-19. An individual without symptoms of COVID-19 and who is not shedding SARS-CoV-2 virus would  expect to have a negative (not detected) result in this assay.   SARS CORONAVIRUS 2 (TAT 6-24 HRS) Nasopharyngeal Nasopharyngeal Swab     Status: None   Collection Time: 01/28/19  1:24 AM   Specimen: Nasopharyngeal Swab  Result Value Ref Range Status   SARS Coronavirus 2 NEGATIVE NEGATIVE Final    Comment: (NOTE) SARS-CoV-2 target nucleic acids are NOT DETECTED. The SARS-CoV-2 RNA is generally detectable in upper and lower respiratory specimens during the acute phase of infection. Negative results do not preclude SARS-CoV-2 infection, do not rule out co-infections with other pathogens, and should not be used as the sole basis for treatment or  other patient management decisions. Negative results must be combined with clinical observations, patient history, and epidemiological information. The expected result is Negative. Fact Sheet for Patients: SugarRoll.be Fact Sheet for Healthcare Providers: https://www.woods-mathews.com/ This test is not yet approved or cleared by the [place] FDA and  has been authorized for detection and/or diagnosis of SARS-CoV-2 by FDA under an Emergency Use Authorization (EUA). This EUA will remain  in effect (meaning this test can be used) for the duration of the COVID-19 declaration under Section 56 4(b)(1) of the Act, 21 U.S.C. section 360bbb-3(b)(1), unless the  authorization is terminated or revoked sooner. Performed at [facility], [place] [address], [place], [place] [address]   Culture, blood (routine x 2)     Status: None (Preliminary result)   Collection Time: 01/28/19  7:11 AM   Specimen: Right Antecubital; Blood  Result Value Ref Range Status   Specimen Description   Final    RIGHT ANTECUBITAL BOTTLES DRAWN AEROBIC AND ANAEROBIC   Special Requests Blood Culture adequate volume  Final   Culture   Final    NO GROWTH 3 DAYS Performed at [facility], [address] [place], [place] [address]    Report Status PENDING  Incomplete  Culture, blood (routine x 2)     Status: None (Preliminary result)   Collection Time: 01/28/19  7:18 AM   Specimen: BLOOD LEFT HAND  Result Value Ref Range Status   Specimen Description   Final    BLOOD LEFT HAND BOTTLES DRAWN AEROBIC AND ANAEROBIC   Special Requests Blood Culture adequate volume  Final   Culture   Final    NO GROWTH 3 DAYS Performed at [facility], [address] [place], [place] [address]    Report Status PENDING  Incomplete      Radiology Studies: Dg Chest 2 View  Result Date: 01/31/2019 CLINICAL DATA:  Continued cough EXAM: CHEST - 2 VIEW COMPARISON:  01/28/2019 FINDINGS: Cardiac  shadow remains enlarged stable from the prior exam. Persistent density in the right base consistent with infiltrate and effusion is noted. No pneumothorax is seen. No significant vascular congestion is noted. IMPRESSION: Stable right basilar atelectasis and effusion. Electronically Signed   By: Inez Catalina M.D.   On: 01/31/2019 10:08   Mr Brain Wo Contrast  Result Date: 01/29/2019 CLINICAL DATA:  Initial evaluation for acute ataxia, stroke suspected. EXAM: MRI HEAD WITHOUT CONTRAST TECHNIQUE: Multiplanar, multiecho pulse sequences of the brain and surrounding structures were obtained without intravenous contrast. COMPARISON:  Prior CT from 01/28/2019. FINDINGS: Brain: Mild diffuse prominence of the CSF containing spaces compatible generalized age-related cerebral atrophy. Patchy and confluent T2/FLAIR hyperintensity within the periventricular and deep white matter both cerebral hemispheres as well as the pons, most consistent with chronic small vessel ischemic disease, moderate in nature. Superimposed encephalomalacia within the parasagittal right occipital lobe consistent with chronic right PCA territory infarct. No abnormal foci of restricted diffusion to suggest acute or subacute ischemia. Gray-white matter differentiation maintained. No other areas of chronic cortical infarction. No foci of susceptibility artifact to suggest acute or chronic intracranial hemorrhage. No mass lesion, midline shift or mass effect. Ventricles normal size without hydrocephalus. No extra-axial fluid collection. Pituitary gland within normal limits. Midline structures intact. Vascular: Major intracranial vascular flow voids are maintained. Skull and upper cervical spine: Craniocervical junction within normal limits. Visualized upper cervical spine normal. Bone marrow signal intensity within normal limits. No scalp soft tissue abnormality. Sinuses/Orbits: Patient status post bilateral ocular lens replacement. Globes and orbital soft  tissues demonstrate no acute finding. Paranasal sinuses are clear. No mastoid effusion. Inner ear structures grossly normal. Other: None. IMPRESSION: 1. No acute intracranial abnormality identified. 2. Chronic right PCA territory infarct involving the parasagittal right occipital lobe. 3. Age-related cerebral atrophy with moderate chronic microvascular ischemic disease. Electronically Signed   By: Jeannine Boga M.D.   On: 01/29/2019 21:51   Nm Pulmonary Perfusion  Result Date: 01/29/2019 CLINICAL DATA:  Shortness of breath and chest pain EXAM: NUCLEAR MEDICINE PERFUSION LUNG SCAN TECHNIQUE: Perfusion images were obtained in multiple projections  after intravenous injection of radiopharmaceutical. Ventilation scans intentionally deferred if perfusion scan and chest x-ray adequate for interpretation during COVID 19 epidemic. RADIOPHARMACEUTICALS:  1.53 mCi Tc-81m MAA IV COMPARISON:  Chest radiograph January 28, 2019 FINDINGS: There are segmental level perfusion defects in each lower lobe, placing this study in a high probability category based on PIOPED II criteria. There is cardiomegaly. IMPRESSION: Segmental level perfusion defects in each lower lobe, a finding placing this study in a high probability category based on PIOPED II criteria. These results will be called to the ordering clinician or representative by the Radiologist Assistant, and communication documented in the PACS or zVision Dashboard. Electronically Signed   By: Lowella Grip III M.D.   On: 01/29/2019 16:44   Vas [place] Lower Extremity Venous (dvt)  Result Date: 01/30/2019  Lower Venous Study Indications: Pulmonary embolism.  Comparison Study: no prior Performing Technologist: June Leap RDMS, RVT  Examination Guidelines: A complete evaluation includes B-mode imaging, spectral Doppler, color Doppler, and power Doppler as needed of all accessible portions of each vessel. Bilateral testing is considered an integral part of a complete  examination. Limited examinations for reoccurring indications may be performed as noted.  +---------+---------------+---------+-----------+----------+--------------+  RIGHT     Compressibility Phasicity Spontaneity Properties Thrombus Aging  +---------+---------------+---------+-----------+----------+--------------+  CFV       Full            Yes       Yes                                    +---------+---------------+---------+-----------+----------+--------------+  SFJ       Full                                                             +---------+---------------+---------+-----------+----------+--------------+  FV Prox   Full                                                             +---------+---------------+---------+-----------+----------+--------------+  FV Mid    Full                                                             +---------+---------------+---------+-----------+----------+--------------+  FV Distal Full                                                             +---------+---------------+---------+-----------+----------+--------------+  PFV       Full                                                             +---------+---------------+---------+-----------+----------+--------------+  POP       Full            Yes       Yes                                    +---------+---------------+---------+-----------+----------+--------------+  PTV       Full                                                             +---------+---------------+---------+-----------+----------+--------------+  PERO      Full                                                             +---------+---------------+---------+-----------+----------+--------------+   +---------+---------------+---------+-----------+----------+--------------+  LEFT      Compressibility Phasicity Spontaneity Properties Thrombus Aging  +---------+---------------+---------+-----------+----------+--------------+  CFV       Full            Yes        Yes                                    +---------+---------------+---------+-----------+----------+--------------+  SFJ       Full                                                             +---------+---------------+---------+-----------+----------+--------------+  FV Prox   Full                                                             +---------+---------------+---------+-----------+----------+--------------+  FV Mid    Full                                                             +---------+---------------+---------+-----------+----------+--------------+  FV Distal Full                                                             +---------+---------------+---------+-----------+----------+--------------+  PFV       Full                                                             +---------+---------------+---------+-----------+----------+--------------+  POP       Full            Yes       Yes                                    +---------+---------------+---------+-----------+----------+--------------+  PTV       Full                                                             +---------+---------------+---------+-----------+----------+--------------+  PERO      Full                                                             +---------+---------------+---------+-----------+----------+--------------+     Summary: Right: There is no evidence of deep vein thrombosis in the lower extremity. A cystic structure is found in the popliteal fossa. Left: There is no evidence of deep vein thrombosis in the lower extremity. A cystic structure is found in the popliteal fossa.  *See table(s) above for measurements and observations. Electronically signed by Curt Jews MD on 01/30/2019 at 4:52:17 PM.    Final     Scheduled Meds:  amLODipine  5 mg Oral Daily   atorvastatin  40 mg Oral Daily   furosemide  80 mg Intravenous BID   isosorbide mononitrate  30 mg Oral Daily   metoprolol succinate  100 mg Oral BID     sacubitril-valsartan  1 tablet Oral BID   sodium chloride flush  3 mL Intravenous Q12H   thyroid  30 mg Oral QAC breakfast   Continuous Infusions:  sodium chloride 250 mL (01/28/19 0828)   heparin 1,250 Units/hr (01/31/19 1137)     LOS: 3 days   Time spent: 27 minutes   Darliss Cheney, MD [facility] Pager [phone]  If 7PM-7AM, please contact night-coverage [email or web address] Password [email or web address] 01/31/2019, 1:00 PM

## 2019-01-31 NOTE — TOC Benefit Eligibility Note (Signed)
Transition of Care [facility]) Benefit Eligibility Note    Patient Details  Name: Lawrence Bonilla MRN: [id number] Date of Birth: 10/11/1950   Medication/Dose: Eliquis (apixaban) 2.5mg  bid, or 5mg  bid  Covered?: Yes  Tier: 2 Drug  Prescription Coverage Preferred Pharmacy: [facility] or [facility]  Spoke with Person/Company/Phone Number:: Nat Math  Co-Pay: 47.00  for a 30 day supply  Prior Approval: No  Deductible: (Unavailable)       [name] Phone Number: 01/31/2019, 9:50 AM

## 2019-01-31 NOTE — Care Management Important Message (Signed)
Important Message  Patient Details  Name: Lawrence Bonilla MRN: [id number] Date of Birth: 09/22/1950   Medicare Important Message Given:  Yes     Shelda Altes 01/31/2019, 3:20 PM

## 2019-01-31 NOTE — Progress Notes (Signed)
ANTICOAGULATION CONSULT NOTE - [place] for warfarin to heparin/apixaban  Indication: atrial fibrillation, PE   No Known Allergies    Patient Measurements: Last Weight  Most recent update: 01/31/2019  6:41 AM   Weight  93 kg (205 lb 1.6 oz)           Body mass index is 27.82 kg/m. Heparin dosing weight: 91.7 kg               Temp: 98.4 F (36.9 C) (09/10 1937) Temp Source: Oral (09/10 1937) BP: 135/101 (09/10 1937) Pulse Rate: 67 (09/10 1937)  Labs: Recent Labs    01/29/19 0509 01/29/19 1710 01/29/19 1909  01/30/19 0330 01/30/19 0833 01/30/19 1147 01/30/19 2240 01/31/19 0938 01/31/19 1049 01/31/19 1835  HGB 13.2  --  13.5  --  14.1  --   --   --  15.1  --   --   HCT 40.4  --  41.6  --  46.2  --   --   --  46.6  --   --   PLT 106*  --  122*  --  127*  --   --   --  153  --   --   LABPROT 53.7* 49.5*  --   --   --  33.2*  --   --  23.0*  --   --   INR 6.2* 5.6*  --   --   --  3.3*  --   --  2.1*  --   --   HEPARINUNFRC  --   --   --    < >  --  0.63  --  0.99*  --  0.79* 0.60  CREATININE 2.61*  --   --   --   --   --  2.23*  --  2.63*  --   --    < > = values in this interval not displayed.    Estimated Creatinine Clearance: 29.5 mL/min (A) (by C-G formula based on SCr of 2.63 mg/dL (H)).     Medications:  Medications Prior to Admission  Medication Sig Dispense Refill Last Dose  . atorvastatin (LIPITOR) 40 MG tablet Take 40 mg by mouth daily.   01/27/2019 at Unknown time  . furosemide (LASIX) 40 MG tablet Take 80 mg am ( 2 tablets) and 40 mg pm (1 tablet) 90 tablet 6 01/27/2019 at Unknown time  . isosorbide mononitrate (IMDUR) 30 MG 24 hr tablet Take 30 mg by mouth daily.   01/27/2019 at Unknown time  . lisinopril (ZESTRIL) 10 MG tablet Take 10 mg by mouth 2 (two) times daily.    01/27/2019 at Unknown time  . metoprolol succinate (TOPROL-XL) 100 MG 24 hr tablet Take 100 mg by mouth 2 (two) times daily. Take with or immediately following a  meal.   01/27/2019 at 9 am  . NP THYROID 30 MG tablet TK 1 T PO QD   01/27/2019 at Unknown time  . sacubitril-valsartan (ENTRESTO) 24-26 MG Take 1 tablet by mouth 2 (two) times daily. 60 tablet 6 01/27/2019 at Unknown time  . Vitamin D, Ergocalciferol, (DRISDOL) 1.25 MG (50000 UT) CAPS capsule Take 1 capsule by mouth once a week.   01/27/2019 at Unknown time  . warfarin (COUMADIN) 2.5 MG tablet Take 2.5 mg by mouth daily. TWO TABLETS DAILY EXCEPT ONE TABLET ON TUESDAYS & THURSDAYS.   01/27/2019 at 9 am   Scheduled:  . amLODipine  5 mg  Oral Daily  . atorvastatin  40 mg Oral Daily  . furosemide  80 mg Intravenous BID  . isosorbide mononitrate  30 mg Oral Daily  . metoprolol succinate  100 mg Oral BID  . sacubitril-valsartan  1 tablet Oral BID  . sodium chloride flush  3 mL Intravenous Q12H  . thyroid  30 mg Oral QAC breakfast   Infusions:  . sodium chloride 250 mL (01/28/19 0828)  . heparin 1,250 Units/hr (01/31/19 1558)   PRN: sodium chloride, acetaminophen **OR** acetaminophen, guaiFENesin-dextromethorphan, sodium chloride flush Anti-infectives (From admission, onward)   Start     Dose/Rate Route Frequency Ordered Stop   01/28/19 0700  cefTRIAXone (ROCEPHIN) 1 g in sodium chloride 0.9 % 100 mL IVPB  Status:  Discontinued     1 g 200 mL/hr over 30 Minutes Intravenous Every 24 hours 01/28/19 0656 01/29/19 1015   01/28/19 0700  azithromycin (ZITHROMAX) 500 mg in sodium chloride 0.9 % 250 mL IVPB  Status:  Discontinued     500 mg 250 mL/hr over 60 Minutes Intravenous Every 24 hours 01/28/19 0656 01/29/19 1015      Assessment: 18 yoM on warfarin PTA for PAF admitted with acute HF and CP. INR elevated on admit so warfarin was held and pt received vitamin K 2.5mg  PO x1 on 9/8. VQ scan demonstrated high-probability for PE on 9/8 pm so after discussion with Dr. Doristine Bosworth heparin infusion started despite supratherapeutic INR with concern for new VTE. Dopplers 9/9 negative for acute DVT.  Heparin level  this evening is slightly SUPRAtherapeutic of specified lower goal range (HL 0.6 << 0.79, goal of 0.3-0.5). No bleeding or issues noted per RN. Noted plans to transition to Apixaban when INR<2.   Goal of Therapy: Heparin level goal: 0.3-0.5 units/ml INR goal: 2-3 Monitor platelets by anticoagulation protocol: Yes  Plan: - Decrease Heparin to 1150 units/hr (11.5 ml/hr) - Noted plans to transition to apixaban likely 9/11 AM when INR<2 - Will check a HL with AM labs  Thank you for allowing pharmacy to be a part of this patient's care.  Alycia Rossetti, PharmD, BCPS Clinical Pharmacist Clinical phone for 01/31/2019: [phone] 01/31/2019 8:07 PM   **Pharmacist phone directory can now be found on [email or web address] ([email or web address]).  Listed under [place].

## 2019-01-31 NOTE — Progress Notes (Signed)
Transitions of Care Pharmacist Note  Lawrence Bonilla is a 68 y.o. male that has been diagnosed with a PE/A-fibrillation and will be prescribed Eliquis (apixaban) at discharge.   Patient Education: I provided the following education on apixaban to the patient: How to take the medication Described what the medication is Signs of bleeding Signs/symptoms of VTE and stroke  Answered their questions  Discharge Medications Plan: The patient wants to have their discharge medications filled by the Transitions of Care pharmacy rather than their usual pharmacy.  The primary doctor for this patient has been contacted to send all discharge medication prescriptions to the Transitions of Care pharmacy, the discharge orders pharmacy has been changed to the Transitions of Care pharmacy, the patient will receive a phone call regarding co-pay, and their medications will be delivered by the Transitions of Care pharmacy.   Insurance information: [id number] PCN: [id number] Group number: [id number] Member ID number: [id number]  Thank you,   Sherren Kerns, PharmD PGY1 Acute Care Pharmacy Resident [id number] January 31, 2019

## 2019-01-31 NOTE — Progress Notes (Signed)
ANTICOAGULATION CONSULT NOTE - [place] for warfarin to heparin/apixaban  Indication: atrial fibrillation, PE   No Known Allergies    Patient Measurements: Last Weight  Most recent update: 01/31/2019  6:41 AM   Weight  93 kg (205 lb 1.6 oz)           Body mass index is 27.82 kg/m. Heparin dosing weight: 91.7 kg               Temp: 97.3 F (36.3 C) (09/10 1112) Temp Source: Oral (09/10 1112) BP: 143/99 (09/10 1112) Pulse Rate: 62 (09/10 1112)  Labs: Recent Labs    01/29/19 0509 01/29/19 1710 01/29/19 1909  01/30/19 0330 01/30/19 0833 01/30/19 1147 01/30/19 2240 01/31/19 0938 01/31/19 1049  HGB 13.2  --  13.5  --  14.1  --   --   --  15.1  --   HCT 40.4  --  41.6  --  46.2  --   --   --  46.6  --   PLT 106*  --  122*  --  127*  --   --   --  153  --   LABPROT 53.7* 49.5*  --   --   --  33.2*  --   --  23.0*  --   INR 6.2* 5.6*  --   --   --  3.3*  --   --  2.1*  --   HEPARINUNFRC  --   --   --    < >  --  0.63  --  0.99*  --  0.79*  CREATININE 2.61*  --   --   --   --   --  2.23*  --  2.63*  --    < > = values in this interval not displayed.    Estimated Creatinine Clearance: 29.5 mL/min (A) (by C-G formula based on SCr of 2.63 mg/dL (H)).     Medications:  Medications Prior to Admission  Medication Sig Dispense Refill Last Dose  . atorvastatin (LIPITOR) 40 MG tablet Take 40 mg by mouth daily.   01/27/2019 at Unknown time  . furosemide (LASIX) 40 MG tablet Take 80 mg am ( 2 tablets) and 40 mg pm (1 tablet) 90 tablet 6 01/27/2019 at Unknown time  . isosorbide mononitrate (IMDUR) 30 MG 24 hr tablet Take 30 mg by mouth daily.   01/27/2019 at Unknown time  . lisinopril (ZESTRIL) 10 MG tablet Take 10 mg by mouth 2 (two) times daily.    01/27/2019 at Unknown time  . metoprolol succinate (TOPROL-XL) 100 MG 24 hr tablet Take 100 mg by mouth 2 (two) times daily. Take with or immediately following a meal.   01/27/2019 at 9 am  . NP THYROID 30 MG tablet  TK 1 T PO QD   01/27/2019 at Unknown time  . sacubitril-valsartan (ENTRESTO) 24-26 MG Take 1 tablet by mouth 2 (two) times daily. 60 tablet 6 01/27/2019 at Unknown time  . Vitamin D, Ergocalciferol, (DRISDOL) 1.25 MG (50000 UT) CAPS capsule Take 1 capsule by mouth once a week.   01/27/2019 at Unknown time  . warfarin (COUMADIN) 2.5 MG tablet Take 2.5 mg by mouth daily. TWO TABLETS DAILY EXCEPT ONE TABLET ON TUESDAYS & THURSDAYS.   01/27/2019 at 9 am   Scheduled:  . amLODipine  5 mg Oral Daily  . atorvastatin  40 mg Oral Daily  . furosemide  80 mg Intravenous BID  . isosorbide  mononitrate  30 mg Oral Daily  . metoprolol succinate  100 mg Oral BID  . sacubitril-valsartan  1 tablet Oral BID  . sodium chloride flush  3 mL Intravenous Q12H  . thyroid  30 mg Oral QAC breakfast   Infusions:  . sodium chloride 250 mL (01/28/19 0828)  . heparin 1,500 Units/hr (01/30/19 2343)   PRN: sodium chloride, acetaminophen **OR** acetaminophen, guaiFENesin-dextromethorphan, sodium chloride flush Anti-infectives (From admission, onward)   Start     Dose/Rate Route Frequency Ordered Stop   01/28/19 0700  cefTRIAXone (ROCEPHIN) 1 g in sodium chloride 0.9 % 100 mL IVPB  Status:  Discontinued     1 g 200 mL/hr over 30 Minutes Intravenous Every 24 hours 01/28/19 0656 01/29/19 1015   01/28/19 0700  azithromycin (ZITHROMAX) 500 mg in sodium chloride 0.9 % 250 mL IVPB  Status:  Discontinued     500 mg 250 mL/hr over 60 Minutes Intravenous Every 24 hours 01/28/19 0656 01/29/19 1015      Assessment: 46 yoM on warfarin PTA for PAF admitted with acute HF and CP. INR elevated on admit so warfarin was held and pt received vitamin K 2.5mg  PO x1 on 9/8. VQ scan demonstrated high-probability for PE on 9/8 pm so after discussion with Dr. Doristine Bosworth heparin infusion started despite supratherapeutic INR with concern for new VTE. Dopplers 9/9 negative for acute DVT.  Heparin level this AM supratherapeutic at 0.79 from 0.99 s/p rate  decrease overnight.  INR is down to 2.1 today.  Goal of Therapy: Heparin level goal: 0.3-0.5 units/ml INR goal: 2-3 Monitor platelets by anticoagulation protocol: Yes  Plan: -Decrease heparin gtt to 1250 units/hr  -Check heparin level in 8 hours -Daily heparin level, INR, CBC -Once INR is <2, can switch over to Apixaban (likely tomorrow AM)  Bertis Ruddy, PharmD Clinical Pharmacist Please check AMION for all [place] numbers 01/31/2019 11:31 AM

## 2019-01-31 NOTE — Discharge Instructions (Signed)
Information on my medicine - ELIQUIS (apixaban)  This medication education was reviewed with me or my healthcare representative as part of my discharge preparation.  The pharmacist that spoke with me during my hospital stay was:  Werner Lean, [facility]  Why was Eliquis prescribed for you? Eliquis was prescribed to treat blood clots that may have been found in the veins of your legs (deep vein thrombosis) or in your lungs (pulmonary embolism) and to reduce the risk of them occurring again.  What do You need to know about Eliquis ? The starting dose is 10 mg (two 5 mg tablets) taken TWICE daily for the FIRST SEVEN (7) DAYS, then on 02/08/19 the dose is reduced to ONE 5 mg tablet taken TWICE daily.  Eliquis may be taken with or without food.   Try to take the dose about the same time in the morning and in the evening. If you have difficulty swallowing the tablet whole please discuss with your pharmacist how to take the medication safely.  Take Eliquis exactly as prescribed and DO NOT stop taking Eliquis without talking to the doctor who prescribed the medication.  Stopping may increase your risk of developing a new blood clot.  Refill your prescription before you run out.  After discharge, you should have regular check-up appointments with your healthcare provider that is prescribing your Eliquis.    What do you do if you miss a dose? If a dose of ELIQUIS is not taken at the scheduled time, take it as soon as possible on the same day and twice-daily administration should be resumed. The dose should not be doubled to make up for a missed dose.  Important Safety Information A possible side effect of Eliquis is bleeding. You should call your healthcare provider right away if you experience any of the following: ? Bleeding from an injury or your nose that does not stop. ? Unusual colored urine (red or dark brown) or unusual colored stools (red or black). ? Unusual bruising for unknown  reasons. ? A serious fall or if you hit your head (even if there is no bleeding).  Some medicines may interact with Eliquis and might increase your risk of bleeding or clotting while on Eliquis. To help avoid this, consult your healthcare provider or pharmacist prior to using any new prescription or non-prescription medications, including herbals, vitamins, non-steroidal anti-inflammatory drugs (NSAIDs) and supplements.  This website has more information on Eliquis (apixaban): http://www.eliquis.com/eliquis/home

## 2019-02-01 LAB — BASIC METABOLIC PANEL
Anion gap: 14 (ref 5–15)
BUN: 67 mg/dL — ABNORMAL HIGH (ref 8–23)
CO2: 21 mmol/L — ABNORMAL LOW (ref 22–32)
Calcium: 9.1 mg/dL (ref 8.9–10.3)
Chloride: 103 mmol/L (ref 98–111)
Creatinine, Ser: 2.55 mg/dL — ABNORMAL HIGH (ref 0.61–1.24)
GFR calc Af Amer: 29 mL/min — ABNORMAL LOW (ref >60)
GFR calc non Af Amer: 25 mL/min — ABNORMAL LOW (ref >60)
Glucose, Bld: 156 mg/dL — ABNORMAL HIGH (ref 70–99)
Potassium: 3.4 mmol/L — ABNORMAL LOW (ref 3.5–5.1)
Sodium: 138 mmol/L (ref 135–145)

## 2019-02-01 LAB — CBC
HCT: 42 % (ref 39.0–52.0)
Hemoglobin: 13.6 g/dL (ref 13.0–17.0)
MCH: 34.3 pg — ABNORMAL HIGH (ref 26.0–34.0)
MCHC: 32.4 g/dL (ref 30.0–36.0)
MCV: 106.1 fL — ABNORMAL HIGH (ref 80.0–100.0)
Platelets: 130 10*3/uL — ABNORMAL LOW (ref 150–400)
RBC: 3.96 MIL/uL — ABNORMAL LOW (ref 4.22–5.81)
RDW: 14.8 % (ref 11.5–15.5)
WBC: 5.3 10*3/uL (ref 4.0–10.5)
nRBC: 0 % (ref 0.0–0.2)

## 2019-02-01 LAB — PROTIME-INR
INR: 2.1 — ABNORMAL HIGH (ref 0.8–1.2)
Prothrombin Time: 23.4 seconds — ABNORMAL HIGH (ref 11.4–15.2)

## 2019-02-01 LAB — HEPARIN LEVEL (UNFRACTIONATED): Heparin Unfractionated: 0.41 IU/mL (ref 0.30–0.70)

## 2019-02-01 MED ORDER — AMLODIPINE BESYLATE 5 MG PO TABS: 5.0000 mg | ORAL_TABLET | Freq: Every day | ORAL | 0 refills | Status: DC

## 2019-02-01 MED ORDER — ELIQUIS 5 MG VTE STARTER PACK: ORAL_TABLET | ORAL | 0 refills | Status: DC

## 2019-02-01 MED FILL — ELIQUIS STARTER PACK 5 MG T: 5 | 30 days supply | Qty: 74 | Fill #0

## 2019-02-01 MED FILL — AMLODIPINE BESYLATE 5 MG TA: 5 | 30 days supply | Qty: 30 | Fill #0

## 2019-02-01 NOTE — Progress Notes (Signed)
Physical Therapy Treatment Patient Details Name: Lawrence Bonilla MRN: [id number] DOB: [birth year] Today's Date: 02/01/2019    History of Present Illness 68 yo male admitted with shoulder pain, SOB with acute on chronic CHF. MRI with subacute occipital CVA. PMHx: HTN, HLD, DM, neuropathy, gastric bypass, PVD, PAfib, CHF, lymphedema    PT Comments    Pt pleasant and reports feeling a little fatigued from moving around the room. Pt with SpO2 drop to 86% on Ra with gait and required 2L to maintain 92% with activity. Pt with slightly unsteady gait with encouragement to use cane at all times for gait and initiate walking program. Pt educated for current supplemental O2 need and activity progression.   Follow Up Recommendations  Home health PT;Supervision/Assistance - 24 hour     Equipment Recommendations  None recommended by PT    Recommendations for Other Services       Precautions / Restrictions Precautions Precautions: Fall Precaution Comments: watch sats    Mobility  Bed Mobility Overal bed mobility: Modified Independent             General bed mobility comments: HOB elevated 35 degrees  Transfers Overall transfer level: Modified independent                  Ambulation/Gait Ambulation/Gait assistance: Supervision Gait Distance (Feet): 100 Feet Assistive device: None Gait Pattern/deviations: Step-through pattern;Decreased stride length   Gait velocity interpretation: 1.31 - 2.62 ft/sec, indicative of limited community ambulator General Gait Details: pt walked 130' without AD including stairs with 2 partial LOB with tactile cues to correct. SpO2 dropped to 86% on RA, maintained 92% on 2L. Pt with fatigue after 130' requiring 3 min seated rest prior to return 100' to room with additional 2 partial LOB   Stairs Stairs: Yes Stairs assistance: Min guard Stair Management: Step to pattern;Forwards;One rail Right Number of Stairs: 3 General stair comments: pt  reports he has a column to hold onto at home and encouraged installation of railing for safety   Wheelchair Mobility    Modified Rankin (Stroke Patients Only)       Balance Overall balance assessment: Needs assistance Sitting-balance support: No upper extremity supported;Feet supported Sitting balance-Leahy Scale: Good       Standing balance-Leahy Scale: Good Standing balance comment: pt with 4 partial LOB during gait                            Cognition Arousal/Alertness: Awake/alert Behavior During Therapy: WFL for tasks assessed/performed Overall Cognitive Status: Within Functional Limits for tasks assessed                                        Exercises      General Comments        Pertinent Vitals/Pain Faces Pain Scale: Hurts little more Pain Location: chest soreness with coughing Pain Descriptors / Indicators: Aching Pain Intervention(s): Monitored during session    Home Living                      Prior Function            PT Goals (current goals can now be found in the care plan section) Progress towards PT goals: Progressing toward goals    Frequency           PT Plan  Current plan remains appropriate    Co-evaluation              AM-PAC PT "6 Clicks" Mobility   Outcome Measure  Help needed turning from your back to your side while in a flat bed without using bedrails?: None Help needed moving from lying on your back to sitting on the side of a flat bed without using bedrails?: None Help needed moving to and from a bed to a chair (including a wheelchair)?: None Help needed standing up from a chair using your arms (e.g., wheelchair or bedside chair)?: None Help needed to walk in hospital room?: A Little Help needed climbing 3-5 steps with a railing? : A Little 6 Click Score: 22    End of Session Equipment Utilized During Treatment: Oxygen Activity Tolerance: Patient limited by fatigue Patient  left: in chair;with call bell/phone within reach Nurse Communication: Mobility status PT Visit Diagnosis: Unsteadiness on feet (R26.81);Difficulty in walking, not elsewhere classified (R26.2)     Time: [id number] PT Time Calculation (min) (ACUTE ONLY): 28 min  Charges:  $Gait Training: 8-22 mins $Therapeutic Activity: 8-22 mins                     [place], PT Acute Rehabilitation Services Pager: [phone] Office: [place] 02/01/2019, 1:12 PM

## 2019-02-01 NOTE — Discharge Summary (Signed)
Physician Discharge Summary  Lawrence Bonilla [id number] DOB: 1950-09-26 DOA: 01/27/2019  PCP: Lawrence Albee, MD  Admit date: 01/27/2019 Discharge date: 02/01/2019  Admitted From: Home Disposition: Home  Recommendations for Outpatient Follow-up:  1. Follow up with PCP in 1-2 weeks 2. Follow with cardiology in 2 to 4 weeks 3. Follow-up with nephrology in 1 to 2 weeks 4. Please obtain BMP/CBC in one week 5. Please follow up on the following pending results:  Home Health: Yes Equipment/Devices: Home oxygen  Discharge Condition: Stable CODE STATUS: Full code Diet recommendation: Low-sodium diet  Subjective: Patient seen and examined.  Feels much better.  No shortness of breath.  Ready to go home.  Brief/Interim Summary: JohnDanielis a68 y.o.male,w hypertension, hyperlipidemia, Dm2 w neuropathy, hx of gastric bypass surgery, Pvd, Pafib, CHF, lymphedema who presented to a PED on early morning of 01/28/2019 with a complaint of 2 weeks of chest pain, 1 day of shoulder pain as well as shortness of breath for last couple of days.  He was found to have acute on chronic systolic congestive heart failure.  CT of the chest was done but that was without contrast and this confirmed CHF and possible pneumonia.  Patient had slightly elevated troponin level.  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  Patient was eventually transferred to [facility] in order to get MRI of the brain and see cardiologist however his troponins were flat and then he was diagnosed with PE so cardiology was not consulted.  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  Patient does not have any focal neurological deficit.  Patient confirms that he has had a stroke 20 years ago where he lost his vision temporarily but that was fixed. Due to acute hypoxic respiratory failure and elevated troponin,  PE was suspected.  Due to elevated creatinine, CT angiogram was not an option so VQ scan was obtained which was high probability for PE despite of him having supratherapeutic INR.  Patient was then started on heparin drip and his Coumadin was on hold.  He was given vitamin K due to supratherapeutic INR.  His INR currently is 2.1.  Per protocol, he will be switched to Eliquis once his INR is less than 2 which I hope will be tomorrow.  He is doing much better today.  He has been off of the oxygen for more than 24 hours.  While ambulating, he dropped his oxygen saturation to 88% on room air.  For this reason, we are sending him on home oxygen with 1 L which I presume he will require only for a few days.  For some reason, he tells me that he was taking Lasix 60 mg p.o. twice daily.  His home medications have 80 mg p.o. twice daily documented.  I will continue this Lasix at home.  He is a stable for discharge today.  He will start his Eliquis starter pack starting tomorrow morning.  He will follow with PCP within a week, cardiology within 1 to 2 weeks and nephrology within 1 to 2 weeks as well.  His renal function is stable at baseline.  Discharge Diagnoses:  Principal Problem:   Acute on chronic systolic CHF (congestive heart failure) (HCC) Active Problems:   Atrial fibrillation (HCC)   CKD (chronic kidney disease), stage IV (HCC)   Pleural effusion on right   Stroke ([place])  Supratherapeutic INR    Discharge Instructions  Discharge Instructions    Discharge patient   Complete by: As directed    Discharge disposition: 06-Home-Health Care Svc   Discharge patient date: 02/01/2019     Allergies as of 02/01/2019   No Known Allergies     Medication List    STOP taking these medications   lisinopril 10 MG tablet Commonly known as: ZESTRIL   warfarin 2.5 MG tablet Commonly known as: COUMADIN     TAKE these medications   amLODipine 5 MG tablet Commonly known as: NORVASC Take 1 tablet (5 mg  total) by mouth daily. Start taking on: February 02, 2019   atorvastatin 40 MG tablet Commonly known as: LIPITOR Take 40 mg by mouth daily.   Eliquis DVT/PE Starter Pack 5 MG Tabs Take as directed on package: start with two-5mg  tablets twice daily for 7 days. On day 8, switch to one-5mg  tablet twice daily.   Entresto 24-26 MG Generic drug: sacubitril-valsartan Take 1 tablet by mouth 2 (two) times daily.   furosemide 40 MG tablet Commonly known as: LASIX Take 80 mg am ( 2 tablets) and 40 mg pm (1 tablet)   isosorbide mononitrate 30 MG 24 hr tablet Commonly known as: IMDUR Take 30 mg by mouth daily.   metoprolol succinate 100 MG 24 hr tablet Commonly known as: TOPROL-XL Take 100 mg by mouth 2 (two) times daily. Take with or immediately following a meal.   NP Thyroid 30 MG tablet Generic drug: thyroid TK 1 T PO QD   Vitamin D (Ergocalciferol) 1.25 MG (50000 UT) Caps capsule Commonly known as: DRISDOL Take 1 capsule by mouth once a week.            Durable Medical Equipment  (From admission, onward)         Start     Ordered   02/01/19 0938  For home use only DME oxygen  Once    Question Answer Comment  Length of Need 6 Months   Mode or (Route) Nasal cannula   Liters per Minute 1   Frequency Continuous (stationary and portable oxygen unit needed)   Oxygen conserving device Yes   Oxygen delivery system Gas      02/01/19 0939         Follow-up Information    Lawrence Albee, MD Follow up in 1 week(s).   Specialty: Internal Medicine Contact information: [place] [id number] [phone]          No Known Allergies  Consultations: None   Procedures/Studies: Dg Chest 2 View  Result Date: 01/31/2019 CLINICAL DATA:  Continued cough EXAM: CHEST - 2 VIEW COMPARISON:  01/28/2019 FINDINGS: Cardiac shadow remains enlarged stable from the prior exam. Persistent density in the right base consistent with infiltrate and effusion is noted.  No pneumothorax is seen. No significant vascular congestion is noted. IMPRESSION: Stable right basilar atelectasis and effusion. Electronically Signed   By: Inez Catalina M.D.   On: 01/31/2019 10:08   Ct Head Wo Contrast  Result Date: 01/28/2019 CLINICAL DATA:  Ataxia, fall 5 days ago EXAM: CT HEAD WITHOUT CONTRAST; CT CERVICAL SPINE WITHOUT CONTRAST TECHNIQUE: Contiguous axial images were obtained from the base of the skull through the vertex without intravenous contrast. COMPARISON:  MRI March 29, 2017 FINDINGS: Brain: There is a area of hypodensity seen within the posterior right occipital lobe. There is dilatation the ventricles and sulci consistent with age-related atrophy. There patchy areas of  low-attenuation changes in the deep white matter consistent with small vessel ischemia. No extra-axial collections. Vascular: No hyperdense vessel or unexpected calcification. Skull: The skull is intact. No fracture or focal lesion identified. Sinuses/Orbits: The visualized paranasal sinuses and mastoid air cells are clear. The orbits and globes intact. Other: None Cervical spine: Alignment: Physiologic Skull base and vertebrae: Visualized skull base is intact. No atlanto-occipital dissociation. The vertebral body heights are well maintained. No fracture or pathologic osseous lesion seen. Soft tissues and spinal canal: The visualized paraspinal soft tissues are unremarkable. No prevertebral soft tissue swelling is seen. The spinal canal is grossly unremarkable, no large epidural collection or significant canal narrowing. Disc levels: Degenerative changes are seen most notable at C5-C6 with disc osteophyte complex and uncovertebral osteophytes. Upper chest: The lung apices are clear. Thoracic inlet is within normal limits. Other: None IMPRESSION: 1. Age-indeterminate infarct of the posterior right medial occipital lobe. 2. No acute fracture or malalignment of the cervical spine. 3. Findings consistent with age  related atrophy and chronic small vessel ischemia Electronically Signed   By: Prudencio Pair M.D.   On: 01/28/2019 02:42   Ct Chest Wo Contrast  Result Date: 01/28/2019 CLINICAL DATA:  Right pleural effusion.  Weight loss. EXAM: CT CHEST WITHOUT CONTRAST TECHNIQUE: Multidetector CT imaging of the chest was performed following the standard protocol without IV contrast. COMPARISON:  Chest x-ray from earlier today FINDINGS: Cardiovascular: Cardiomegaly. No pericardial effusion. Aortic and coronary atherosclerosis. Mediastinum/Nodes: Negative for adenopathy or mass. Lungs/Pleura: Moderate right pleural effusion that is low-density and dependent. Small left pleural effusion. Multi segment atelectasis mainly in the right lower lung. No pulmonary edema. No definite consolidation. No masslike findings. Upper Abdomen: Gastric bypass. Renal cystic densities which are partially covered. There is likely also a proteinaceous cyst in the upper pole left kidney. Musculoskeletal: Degenerative spondylosis with multi-level ankylosis. There is exaggerated thoracic kyphosis. No acute finding Other: Gynecomastia. IMPRESSION: 1. Moderate right and small left pleural effusion which is dependent and simple appearing. 2. Right-sided atelectasis. 3. Cardiomegaly and atherosclerosis. Electronically Signed   By: Monte Fantasia M.D.   On: 01/28/2019 08:18   Ct Cervical Spine Wo Contrast  Result Date: 01/28/2019 CLINICAL DATA:  Ataxia, fall 5 days ago EXAM: CT HEAD WITHOUT CONTRAST; CT CERVICAL SPINE WITHOUT CONTRAST TECHNIQUE: Contiguous axial images were obtained from the base of the skull through the vertex without intravenous contrast. COMPARISON:  MRI March 29, 2017 FINDINGS: Brain: There is a area of hypodensity seen within the posterior right occipital lobe. There is dilatation the ventricles and sulci consistent with age-related atrophy. There patchy areas of low-attenuation changes in the deep white matter consistent with small  vessel ischemia. No extra-axial collections. Vascular: No hyperdense vessel or unexpected calcification. Skull: The skull is intact. No fracture or focal lesion identified. Sinuses/Orbits: The visualized paranasal sinuses and mastoid air cells are clear. The orbits and globes intact. Other: None Cervical spine: Alignment: Physiologic Skull base and vertebrae: Visualized skull base is intact. No atlanto-occipital dissociation. The vertebral body heights are well maintained. No fracture or pathologic osseous lesion seen. Soft tissues and spinal canal: The visualized paraspinal soft tissues are unremarkable. No prevertebral soft tissue swelling is seen. The spinal canal is grossly unremarkable, no large epidural collection or significant canal narrowing. Disc levels: Degenerative changes are seen most notable at C5-C6 with disc osteophyte complex and uncovertebral osteophytes. Upper chest: The lung apices are clear. Thoracic inlet is within normal limits. Other: None IMPRESSION: 1. Age-indeterminate infarct of  the posterior right medial occipital lobe. 2. No acute fracture or malalignment of the cervical spine. 3. Findings consistent with age related atrophy and chronic small vessel ischemia Electronically Signed   By: Prudencio Pair M.D.   On: 01/28/2019 02:42   Mr Brain Wo Contrast  Result Date: 01/29/2019 CLINICAL DATA:  Initial evaluation for acute ataxia, stroke suspected. EXAM: MRI HEAD WITHOUT CONTRAST TECHNIQUE: Multiplanar, multiecho pulse sequences of the brain and surrounding structures were obtained without intravenous contrast. COMPARISON:  Prior CT from 01/28/2019. FINDINGS: Brain: Mild diffuse prominence of the CSF containing spaces compatible generalized age-related cerebral atrophy. Patchy and confluent T2/FLAIR hyperintensity within the periventricular and deep white matter both cerebral hemispheres as well as the pons, most consistent with chronic small vessel ischemic disease, moderate in nature.  Superimposed encephalomalacia within the parasagittal right occipital lobe consistent with chronic right PCA territory infarct. No abnormal foci of restricted diffusion to suggest acute or subacute ischemia. Gray-white matter differentiation maintained. No other areas of chronic cortical infarction. No foci of susceptibility artifact to suggest acute or chronic intracranial hemorrhage. No mass lesion, midline shift or mass effect. Ventricles normal size without hydrocephalus. No extra-axial fluid collection. Pituitary gland within normal limits. Midline structures intact. Vascular: Major intracranial vascular flow voids are maintained. Skull and upper cervical spine: Craniocervical junction within normal limits. Visualized upper cervical spine normal. Bone marrow signal intensity within normal limits. No scalp soft tissue abnormality. Sinuses/Orbits: Patient status post bilateral ocular lens replacement. Globes and orbital soft tissues demonstrate no acute finding. Paranasal sinuses are clear. No mastoid effusion. Inner ear structures grossly normal. Other: None. IMPRESSION: 1. No acute intracranial abnormality identified. 2. Chronic right PCA territory infarct involving the parasagittal right occipital lobe. 3. Age-related cerebral atrophy with moderate chronic microvascular ischemic disease. Electronically Signed   By: Jeannine Boga M.D.   On: 01/29/2019 21:51   Nm Pulmonary Perfusion  Result Date: 01/29/2019 CLINICAL DATA:  Shortness of breath and chest pain EXAM: NUCLEAR MEDICINE PERFUSION LUNG SCAN TECHNIQUE: Perfusion images were obtained in multiple projections after intravenous injection of radiopharmaceutical. Ventilation scans intentionally deferred if perfusion scan and chest x-ray adequate for interpretation during COVID 19 epidemic. RADIOPHARMACEUTICALS:  1.53 mCi Tc-62m MAA IV COMPARISON:  Chest radiograph January 28, 2019 FINDINGS: There are segmental level perfusion defects in each lower  lobe, placing this study in a high probability category based on PIOPED II criteria. There is cardiomegaly. IMPRESSION: Segmental level perfusion defects in each lower lobe, a finding placing this study in a high probability category based on PIOPED II criteria. These results will be called to the ordering clinician or representative by the Radiologist Assistant, and communication documented in the PACS or zVision Dashboard. Electronically Signed   By: Lowella Grip III M.D.   On: 01/29/2019 16:44   US Renal  Addendum Date: 01/23/2019   ADDENDUM REPORT: 01/23/2019 08:12 ADDENDUM: Please note most of the described cystic lesions in the kidneys appear to be simple cysts. There are several cysts which are suboptimally evaluated due technique but also likely represent simple cysts. No definite solid lesion identified within the limitation of the exam. The renal cysts can be follow-up with ultrasound 3-6 months. Electronically Signed   By: Anner Crete M.D.   On: 01/23/2019 08:12   Result Date: 01/23/2019 CLINICAL DATA:  68 year old male with chronic kidney disease. History of hypertension and diabetes. EXAM: RENAL / URINARY TRACT ULTRASOUND COMPLETE COMPARISON:  None. FINDINGS: Right Kidney: Renal measurements: 11.2 x 5.2 x 5.2  cm = volume: 157 mL. There is diffuse increased renal echogenicity. No hydronephrosis or shadowing stone. Several cystic appearing lesions with the largest measuring 2.3 x 2.5 x 2.7 cm in the midpole. Left Kidney: Renal measurements: 10.5 x 6.1 x 5.8 cm = volume: 195 mL. There is diffuse increased renal echogenicity. No hydronephrosis or shadowing stone. Several cystic appearing lesions measure up to 4.2 x 3.9 x 4.0 cm in the interpolar aspect of the left kidney. Bladder: Appears normal for degree of bladder distention. The liver appears enlarged. There is diffuse increased liver echogenicity most commonly seen in the setting of fatty infiltration. IMPRESSION: 1. Echogenic kidneys in  keeping with chronic kidney disease. No hydronephrosis or shadowing stone. 2. Bilateral renal cysts. 3. Enlarged and fatty liver. Electronically Signed: By: Anner Crete M.D. On: 01/15/2019 19:49   US Carotid Bilateral  Result Date: 01/28/2019 CLINICAL DATA:  Hypertension and hyperlipidemia. EXAM: BILATERAL CAROTID DUPLEX ULTRASOUND TECHNIQUE: Pearline Cables scale imaging, color Doppler and duplex ultrasound were performed of bilateral carotid and vertebral arteries in the neck. COMPARISON:  None. FINDINGS: Criteria: Quantification of carotid stenosis is based on velocity parameters that correlate the residual internal carotid diameter with NASCET-based stenosis levels, using the diameter of the distal internal carotid lumen as the denominator for stenosis measurement. The following velocity measurements were obtained: RIGHT ICA: 74/23 cm/sec CCA: 0000000 cm/sec SYSTOLIC ICA/CCA RATIO:  2.2 ECA: 42 cm/sec LEFT ICA: 52/17 cm/sec CCA: [id number] cm/sec SYSTOLIC ICA/CCA RATIO:  1.0 ECA: 36 cm/sec RIGHT CAROTID ARTERY: There is no grayscale evidence of significant intimal thickening or atherosclerotic plaque affecting the interrogated portions of the right carotid system. There are no elevated peak systolic velocities within the interrogated course of the right internal carotid artery to suggest a hemodynamically significant stenosis. RIGHT VERTEBRAL ARTERY:  Antegrade flow LEFT CAROTID ARTERY: There is no grayscale evidence of significant intimal thickening or atherosclerotic plaque affecting the interrogated portions of the left carotid system. There are no elevated peak systolic velocities within the interrogated course of the left internal carotid artery to suggest a hemodynamically significant stenosis. LEFT VERTEBRAL ARTERY:  Antegrade flow IMPRESSION: Unremarkable carotid Doppler ultrasound. Electronically Signed   By: Sandi Mariscal M.D.   On: 01/28/2019 10:01   Dg Chest Port 1 View  Result Date: 01/28/2019 CLINICAL DATA:   Bilateral pleural effusions and atelectasis. EXAM: PORTABLE CHEST 1 VIEW COMPARISON:  Chest radiograph 01/28/2019, chest CT 01/28/2019 FINDINGS: Stable cardiomediastinal contours with enlarged heart size. Persistent right basilar hazy opacity likely a combination of atelectasis and pleural effusion. The left lung is clear. No definite large left pleural effusion. No pneumothorax. No acute finding in the visualized skeleton. IMPRESSION: Persistent right basilar opacity likely a combination of atelectasis and pleural effusion. Electronically Signed   By: Audie Pinto M.D.   On: 01/28/2019 19:29   Dg Chest Portable 1 View  Result Date: 01/28/2019 CLINICAL DATA:  Initial evaluation for acute chest pain. EXAM: PORTABLE CHEST 1 VIEW COMPARISON:  Prior radiograph from 08/25/2018. FINDINGS: Exaggeration of the cardiac silhouette related to AP technique and low lung volumes. Mediastinal silhouette normal. Lungs hypoinflated. Diffuse pulmonary vascular congestion without frank pulmonary edema. Moderate right pleural effusion with associated right basilar opacity, which could reflect atelectasis or infiltrate. Additional trace left pleural effusion with associated mild left basilar subsegmental atelectasis. No pneumothorax. No acute osseous finding. IMPRESSION: 1. Moderate right pleural effusion with associated right basilar opacity, which could reflect atelectasis or infiltrate. 2. Additional trace left pleural effusion with associated left  basilar subsegmental atelectasis. 3. Underlying mild diffuse pulmonary interstitial congestion without frank pulmonary edema. Electronically Signed   By: Jeannine Boga M.D.   On: 01/28/2019 00:12   Vas [place] Lower Extremity Venous (dvt)  Result Date: 01/30/2019  Lower Venous Study Indications: Pulmonary embolism.  Comparison Study: no prior Performing Technologist: June Leap RDMS, RVT  Examination Guidelines: A complete evaluation includes B-mode imaging, spectral  Doppler, color Doppler, and power Doppler as needed of all accessible portions of each vessel. Bilateral testing is considered an integral part of a complete examination. Limited examinations for reoccurring indications may be performed as noted.  +---------+---------------+---------+-----------+----------+--------------+ RIGHT    CompressibilityPhasicitySpontaneityPropertiesThrombus Aging +---------+---------------+---------+-----------+----------+--------------+ CFV      Full           Yes      Yes                                 +---------+---------------+---------+-----------+----------+--------------+ SFJ      Full                                                        +---------+---------------+---------+-----------+----------+--------------+ FV Prox  Full                                                        +---------+---------------+---------+-----------+----------+--------------+ FV Mid   Full                                                        +---------+---------------+---------+-----------+----------+--------------+ FV DistalFull                                                        +---------+---------------+---------+-----------+----------+--------------+ PFV      Full                                                        +---------+---------------+---------+-----------+----------+--------------+ POP      Full           Yes      Yes                                 +---------+---------------+---------+-----------+----------+--------------+ PTV      Full                                                        +---------+---------------+---------+-----------+----------+--------------+ PERO  Full                                                        +---------+---------------+---------+-----------+----------+--------------+   +---------+---------------+---------+-----------+----------+--------------+ LEFT      CompressibilityPhasicitySpontaneityPropertiesThrombus Aging +---------+---------------+---------+-----------+----------+--------------+ CFV      Full           Yes      Yes                                 +---------+---------------+---------+-----------+----------+--------------+ SFJ      Full                                                        +---------+---------------+---------+-----------+----------+--------------+ FV Prox  Full                                                        +---------+---------------+---------+-----------+----------+--------------+ FV Mid   Full                                                        +---------+---------------+---------+-----------+----------+--------------+ FV DistalFull                                                        +---------+---------------+---------+-----------+----------+--------------+ PFV      Full                                                        +---------+---------------+---------+-----------+----------+--------------+ POP      Full           Yes      Yes                                 +---------+---------------+---------+-----------+----------+--------------+ PTV      Full                                                        +---------+---------------+---------+-----------+----------+--------------+ PERO     Full                                                        +---------+---------------+---------+-----------+----------+--------------+  Summary: Right: There is no evidence of deep vein thrombosis in the lower extremity. A cystic structure is found in the popliteal fossa. Left: There is no evidence of deep vein thrombosis in the lower extremity. A cystic structure is found in the popliteal fossa.  *See table(s) above for measurements and observations. Electronically signed by Curt Jews MD on 01/30/2019 at 4:52:17 PM.    Final       Discharge Exam: Vitals:    02/01/19 0508 02/01/19 0919  BP: (!) 136/95 (!) 142/101  Pulse: 65 71  Resp: 20 18  Temp: 97.9 F (36.6 C) 97.8 F (36.6 C)  SpO2: 90% 91%   Vitals:   01/31/19 1937 02/01/19 0508 02/01/19 0546 02/01/19 0919  BP: (!) 135/101 (!) 136/95  (!) 142/101  Pulse: 67 65  71  Resp: 20 20  18   Temp: 98.4 F (36.9 C) 97.9 F (36.6 C)  97.8 F (36.6 C)  TempSrc: Oral Oral  Oral  SpO2: 92% 90%  91%  Weight:   92.7 kg   Height:        General: Pt is alert, awake, not in acute distress Cardiovascular: RRR, S1/S2 +, no rubs, no gallops Respiratory: CTA bilaterally with some diminished breath sounds at the bases, no wheezing, no rhonchi Abdominal: Soft, NT, ND, bowel sounds + Extremities: no edema, no cyanosis    The results of significant diagnostics from this hospitalization (including imaging, microbiology, ancillary and laboratory) are listed below for reference.     Microbiology: Recent Results (from the past 240 hour(s))  Novel Coronavirus, NAA ([facility])     Status: None   Collection Time: 01/22/19 11:30 AM   Specimen: Oropharyngeal(OP) collection in vial transport medium  Result Value Ref Range Status   SARS-CoV-2, NAA Not Detected Not Detected Final    Comment: This nucleic acid amplification test was developed and its perfomance characteristics determined by [employer]. Nucleic acid amplification tests include PCR and TMA. This test has not been FDA cleared or approved. This test has been authorized by FDA under an Emergency Use Authorization (EUA). This test is only authorized for the duration of time the declaration that circumstances exist justifying the authorization of the emergency use of in vitro diagnostic tests for detection of SARS-CoV-2 virus and/or diagnosis of COVID-19 infection under section 564(b)(1) of the Act, 21 U.S.C. [id number]) (1), unless the authorization is terminated or revoked sooner. When diagnostic testing is negative, the possibility  of a false negative result should be considered in the context of a patient's recent exposures and the presence of clinical signs and symptoms consistent with COVID-19. An individual without symptoms of COVID-19 and who is not shedding SARS-CoV-2 virus would  expect to have a negative (not detected) result in this assay.   SARS CORONAVIRUS 2 (TAT 6-24 HRS) Nasopharyngeal Nasopharyngeal Swab     Status: None   Collection Time: 01/28/19  1:24 AM   Specimen: Nasopharyngeal Swab  Result Value Ref Range Status   SARS Coronavirus 2 NEGATIVE NEGATIVE Final    Comment: (NOTE) SARS-CoV-2 target nucleic acids are NOT DETECTED. The SARS-CoV-2 RNA is generally detectable in upper and lower respiratory specimens during the acute phase of infection. Negative results do not preclude SARS-CoV-2 infection, do not rule out co-infections with other pathogens, and should not be used as the sole basis for treatment or other patient management decisions. Negative results must be combined with clinical observations, patient history, and epidemiological information. The expected result is Negative. Fact Sheet for  Patients: SugarRoll.be Fact Sheet for Healthcare Providers: https://www.woods-mathews.com/ This test is not yet approved or cleared by the [place] FDA and  has been authorized for detection and/or diagnosis of SARS-CoV-2 by FDA under an Emergency Use Authorization (EUA). This EUA will remain  in effect (meaning this test can be used) for the duration of the COVID-19 declaration under Section 56 4(b)(1) of the Act, 21 U.S.C. section 360bbb-3(b)(1), unless the authorization is terminated or revoked sooner. Performed at [facility], [place] [address], [place], [place] [address]   Culture, blood (routine x 2)     Status: None (Preliminary result)   Collection Time: 01/28/19  7:11 AM   Specimen: Right Antecubital; Blood  Result Value Ref Range  Status   Specimen Description   Final    RIGHT ANTECUBITAL BOTTLES DRAWN AEROBIC AND ANAEROBIC   Special Requests Blood Culture adequate volume  Final   Culture   Final    NO GROWTH 4 DAYS Performed at [facility], [address] [place], [place] [address]    Report Status PENDING  Incomplete  Culture, blood (routine x 2)     Status: None (Preliminary result)   Collection Time: 01/28/19  7:18 AM   Specimen: BLOOD LEFT HAND  Result Value Ref Range Status   Specimen Description   Final    BLOOD LEFT HAND BOTTLES DRAWN AEROBIC AND ANAEROBIC   Special Requests Blood Culture adequate volume  Final   Culture   Final    NO GROWTH 4 DAYS Performed at [facility], [address], [place], [place] [address]    Report Status PENDING  Incomplete     Labs: BNP (last 3 results) Recent Labs    01/28/19 0031 01/29/19 0509 01/31/19 0938  BNP >4,500.0* >4,500.0* [id number]*   Basic Metabolic Panel: Recent Labs  Lab 01/28/19 0031 01/29/19 0509 01/30/19 1147 01/31/19 0938  NA 140 140 137 138  K 3.8 3.6 3.4* 3.7  CL 104 105 103 103  CO2 24 24 22  21*  GLUCOSE 134* 130* 106* 143*  BUN 57* 59* 63* 66*  CREATININE 2.41* 2.61* 2.23* 2.63*  CALCIUM 8.8* 8.8* 8.6* 8.9  MG 2.4  --   --   --    Liver Function Tests: Recent Labs  Lab 01/29/19 0509 01/31/19 0938  AST 30 34  ALT 26 27  ALKPHOS 121 142*  BILITOT 1.8* 2.4*  PROT 6.2* 6.9  ALBUMIN 2.9* 3.2*   No results for input(s): LIPASE, AMYLASE in the last 168 hours. No results for input(s): AMMONIA in the last 168 hours. CBC: Recent Labs  Lab 01/28/19 0031 01/29/19 0509 01/29/19 1909 01/30/19 0330 01/31/19 0938 02/01/19 0047  WBC 8.3 5.6 5.6 5.6 5.9 5.3  NEUTROABS 7.2  --   --   --   --   --   HGB 14.7 13.2 13.5 14.1 15.1 13.6  HCT 46.4 40.4 41.6 46.2 46.6 42.0  MCV 109.2* 106.9* 106.4* 111.9* 106.9* 106.1*  PLT 126* 106* 122* 127* 153 130*   Cardiac Enzymes: No results for input(s): CKTOTAL, CKMB, CKMBINDEX,  TROPONINI in the last 168 hours. BNP: Invalid input(s): POCBNP CBG: No results for input(s): GLUCAP in the last 168 hours. D-Dimer No results for input(s): DDIMER in the last 72 hours. Hgb A1c No results for input(s): HGBA1C in the last 72 hours. Lipid Profile No results for input(s): CHOL, HDL, LDLCALC, TRIG, CHOLHDL, LDLDIRECT in the last 72 hours. Thyroid function studies No results for input(s): TSH, T4TOTAL,  T3FREE, THYROIDAB in the last 72 hours.  Invalid input(s): FREET3 Anemia work up No results for input(s): VITAMINB12, FOLATE, FERRITIN, TIBC, IRON, RETICCTPCT in the last 72 hours. Urinalysis No results found for: COLORURINE, APPEARANCEUR, [place], [place], [place], [place], [place], [place], PROTEINUR, UROBILINOGEN, NITRITE, LEUKOCYTESUR Sepsis Labs Invalid input(s): PROCALCITONIN,  WBC,  LACTICIDVEN Microbiology Recent Results (from the past 240 hour(s))  Novel Coronavirus, NAA ([facility])     Status: None   Collection Time: 01/22/19 11:30 AM   Specimen: Oropharyngeal(OP) collection in vial transport medium  Result Value Ref Range Status   SARS-CoV-2, NAA Not Detected Not Detected Final    Comment: This nucleic acid amplification test was developed and its perfomance characteristics determined by [employer]. Nucleic acid amplification tests include PCR and TMA. This test has not been FDA cleared or approved. This test has been authorized by FDA under an Emergency Use Authorization (EUA). This test is only authorized for the duration of time the declaration that circumstances exist justifying the authorization of the emergency use of in vitro diagnostic tests for detection of SARS-CoV-2 virus and/or diagnosis of COVID-19 infection under section 564(b)(1) of the Act, 21 U.S.C. [id number]) (1), unless the authorization is terminated or revoked sooner. When diagnostic testing is negative, the possibility of a false negative result should be considered in  the context of a patient's recent exposures and the presence of clinical signs and symptoms consistent with COVID-19. An individual without symptoms of COVID-19 and who is not shedding SARS-CoV-2 virus would  expect to have a negative (not detected) result in this assay.   SARS CORONAVIRUS 2 (TAT 6-24 HRS) Nasopharyngeal Nasopharyngeal Swab     Status: None   Collection Time: 01/28/19  1:24 AM   Specimen: Nasopharyngeal Swab  Result Value Ref Range Status   SARS Coronavirus 2 NEGATIVE NEGATIVE Final    Comment: (NOTE) SARS-CoV-2 target nucleic acids are NOT DETECTED. The SARS-CoV-2 RNA is generally detectable in upper and lower respiratory specimens during the acute phase of infection. Negative results do not preclude SARS-CoV-2 infection, do not rule out co-infections with other pathogens, and should not be used as the sole basis for treatment or other patient management decisions. Negative results must be combined with clinical observations, patient history, and epidemiological information. The expected result is Negative. Fact Sheet for Patients: SugarRoll.be Fact Sheet for Healthcare Providers: https://www.woods-mathews.com/ This test is not yet approved or cleared by the [place] FDA and  has been authorized for detection and/or diagnosis of SARS-CoV-2 by FDA under an Emergency Use Authorization (EUA). This EUA will remain  in effect (meaning this test can be used) for the duration of the COVID-19 declaration under Section 56 4(b)(1) of the Act, 21 U.S.C. section 360bbb-3(b)(1), unless the authorization is terminated or revoked sooner. Performed at [facility], [place] [address] [place], [place] [address]   Culture, blood (routine x 2)     Status: None (Preliminary result)   Collection Time: 01/28/19  7:11 AM   Specimen: Right Antecubital; Blood  Result Value Ref Range Status   Specimen Description   Final    RIGHT  ANTECUBITAL BOTTLES DRAWN AEROBIC AND ANAEROBIC   Special Requests Blood Culture adequate volume  Final   Culture   Final    NO GROWTH 4 DAYS Performed at [facility], [address], [place], [place] [address]    Report Status PENDING  Incomplete  Culture, blood (routine x 2)     Status: None (Preliminary result)   Collection  Time: 01/28/19  7:18 AM   Specimen: BLOOD LEFT HAND  Result Value Ref Range Status   Specimen Description   Final    BLOOD LEFT HAND BOTTLES DRAWN AEROBIC AND ANAEROBIC   Special Requests Blood Culture adequate volume  Final   Culture   Final    NO GROWTH 4 DAYS Performed at [facility], [address] [place], [place] [address]    Report Status PENDING  Incomplete     Time coordinating discharge: Over 30 minutes  SIGNED:   Darliss Cheney, MD  [facility] 02/01/2019, 9:44 AM Pager [phone]  If 7PM-7AM, please contact night-coverage [email or web address] Password [email or web address]

## 2019-02-01 NOTE — Progress Notes (Signed)
SATURATION QUALIFICATIONS: (This note is used to comply with regulatory documentation for home oxygen)  Patient Saturations on Room Air at Rest = 92%  Patient Saturations on Room Air while Ambulating = 88%  Patient Saturations on 2 Liters of oxygen while Ambulating = 92%  Please briefly explain why patient needs home oxygen: desats and SOB on activity  Walks from the room to the half of the other hallway(pass nursing station)

## 2019-02-01 NOTE — Progress Notes (Signed)
Patient alert and oriented, denies pain. IV and Tele removed, d/c instruction explain and given to the patient all questions answered, patient verbalized understanding. D/c patient home per order.

## 2019-02-01 NOTE — Progress Notes (Signed)
ANTICOAGULATION CONSULT NOTE - [place] for warfarin to heparin/apixaban  Indication: atrial fibrillation, PE   No Known Allergies    Patient Measurements: Last Weight  Most recent update: 01/31/2019  6:41 AM   Weight  93 kg (205 lb 1.6 oz)           Body mass index is 27.82 kg/m. Heparin dosing weight: 91.7 kg               Temp: 98.4 F (36.9 C) (09/10 1937) Temp Source: Oral (09/10 1937) BP: 135/101 (09/10 1937) Pulse Rate: 67 (09/10 1937)  Labs: Recent Labs    01/29/19 0509  01/30/19 0330 01/30/19 0833 01/30/19 1147  01/31/19 0938 01/31/19 1049 01/31/19 1835 02/01/19 0047  HGB 13.2   < > 14.1  --   --   --  15.1  --   --  13.6  HCT 40.4   < > 46.2  --   --   --  46.6  --   --  42.0  PLT 106*   < > 127*  --   --   --  153  --   --  130*  LABPROT 53.7*   < >  --  33.2*  --   --  23.0*  --   --  23.4*  INR 6.2*   < >  --  3.3*  --   --  2.1*  --   --  2.1*  HEPARINUNFRC  --    < >  --  0.63  --    < >  --  0.79* 0.60 0.41  CREATININE 2.61*  --   --   --  2.23*  --  2.63*  --   --   --    < > = values in this interval not displayed.    Estimated Creatinine Clearance: 29.5 mL/min (A) (by C-G formula based on SCr of 2.63 mg/dL (H)).     Medications:  Medications Prior to Admission  Medication Sig Dispense Refill Last Dose  . atorvastatin (LIPITOR) 40 MG tablet Take 40 mg by mouth daily.   01/27/2019 at Unknown time  . furosemide (LASIX) 40 MG tablet Take 80 mg am ( 2 tablets) and 40 mg pm (1 tablet) 90 tablet 6 01/27/2019 at Unknown time  . isosorbide mononitrate (IMDUR) 30 MG 24 hr tablet Take 30 mg by mouth daily.   01/27/2019 at Unknown time  . lisinopril (ZESTRIL) 10 MG tablet Take 10 mg by mouth 2 (two) times daily.    01/27/2019 at Unknown time  . metoprolol succinate (TOPROL-XL) 100 MG 24 hr tablet Take 100 mg by mouth 2 (two) times daily. Take with or immediately following a meal.   01/27/2019 at 9 am  . NP THYROID 30 MG tablet TK 1 T  PO QD   01/27/2019 at Unknown time  . sacubitril-valsartan (ENTRESTO) 24-26 MG Take 1 tablet by mouth 2 (two) times daily. 60 tablet 6 01/27/2019 at Unknown time  . Vitamin D, Ergocalciferol, (DRISDOL) 1.25 MG (50000 UT) CAPS capsule Take 1 capsule by mouth once a week.   01/27/2019 at Unknown time  . warfarin (COUMADIN) 2.5 MG tablet Take 2.5 mg by mouth daily. TWO TABLETS DAILY EXCEPT ONE TABLET ON TUESDAYS & THURSDAYS.   01/27/2019 at 9 am   Scheduled:  . amLODipine  5 mg Oral Daily  . atorvastatin  40 mg Oral Daily  . furosemide  80 mg Intravenous BID  .  isosorbide mononitrate  30 mg Oral Daily  . metoprolol succinate  100 mg Oral BID  . sacubitril-valsartan  1 tablet Oral BID  . sodium chloride flush  3 mL Intravenous Q12H  . thyroid  30 mg Oral QAC breakfast   Infusions:  . sodium chloride 250 mL (01/28/19 0828)  . heparin 1,150 Units/hr (01/31/19 2014)   PRN: sodium chloride, acetaminophen **OR** acetaminophen, guaiFENesin-dextromethorphan, sodium chloride flush Anti-infectives (From admission, onward)   Start     Dose/Rate Route Frequency Ordered Stop   01/28/19 0700  cefTRIAXone (ROCEPHIN) 1 g in sodium chloride 0.9 % 100 mL IVPB  Status:  Discontinued     1 g 200 mL/hr over 30 Minutes Intravenous Every 24 hours 01/28/19 0656 01/29/19 1015   01/28/19 0700  azithromycin (ZITHROMAX) 500 mg in sodium chloride 0.9 % 250 mL IVPB  Status:  Discontinued     500 mg 250 mL/hr over 60 Minutes Intravenous Every 24 hours 01/28/19 0656 01/29/19 1015      Assessment: 14 yoM on warfarin PTA for PAF admitted with acute HF and CP. INR elevated on admit so warfarin was held and pt received vitamin K 2.5mg  PO x1 on 9/8. VQ scan demonstrated high-probability for PE on 9/8 pm so after discussion with Dr. Doristine Bosworth heparin infusion started despite supratherapeutic INR with concern for new VTE. Dopplers 9/9 negative for acute DVT.  This morning, heparin level is therapeutic at 0.63, INR down to 3.3. CBC is  stable, no S/Sx bleeding noted by nursing. Given supratherapeutic INR, will avoid boluses and reduce heparin level goal to 0.3-0.5 until INR drops <2.  9/9 PM update:  Heparin level therapeutic x 1 after rate decrease Last INR 2.1 Now with plans to DC heparin and start Apixaban once INR is below 2  Goal of Therapy: Heparin level goal: 0.3-0.5 units/ml INR goal: 2-3 Monitor platelets by anticoagulation protocol: Yes  Plan: -Cont heparin at 1150 units/hr -1200 heparin level -Daily heparin level, INR, CBC -Once INR is <2, can switch over to [place], PharmD, [place] Pharmacist Phone: [phone]

## 2019-02-01 NOTE — TOC Transition Note (Signed)
Transition of Care [facility]) - CM/SW Discharge Note   Patient Details  Name: Lawrence Bonilla MRN: [id number] Date of Birth: [birth year]  Transition of Care [facility]) CM/SW Contact:  Zenon Mayo, RN Phone Number: 02/01/2019, 11:15 AM   Clinical Narrative:    Patient for discharge, he did not have a preference for angency for [facility], HHPT, NCM made referral to [facility], insurance not in network, tried [facility] they were able to take referral. Soc to start on Monday.  Referral made to  [facility]  For home oxygen. Suanne Marker will bring by patient room prior to dc.    Final next level of care: [place] Barriers to Discharge: No Barriers Identified   Patient Goals and CMS Choice Patient states their goals for this hospitalization and ongoing recovery are:: to get better CMS Medicare.gov Compare Post Acute Care list provided to:: Patient Choice offered to / list presented to : Patient  Discharge Placement                       Discharge Plan and Services                DME Arranged: Oxygen DME Agency: [facility] Date DME Agency Contacted: 02/01/19 Time DME Agency Contacted: 1000 Representative spoke with at DME Agency: Suanne Marker HH Arranged: RN, Disease Management, PT   Date [place]: 02/01/19 Time [place]: 1115 Representative spoke with at [place]: [place] ([place]) Interventions     Readmission Risk Interventions No flowsheet data found.

## 2019-02-01 NOTE — Progress Notes (Signed)
SATURATION QUALIFICATIONS: (This note is used to comply with regulatory documentation for home oxygen)  Patient Saturations on Room Air at Rest = 92%  Patient Saturations on Room Air while Ambulating = 86%  Patient Saturations on 2 Liters of oxygen while Ambulating = 92%  Please briefly explain why patient needs home oxygen:pt requires supplemental oxygen with gait to maintain saturations >90% Romario Tith Pam Drown, PT Acute Rehabilitation Services Pager: [phone] Office: [phone]

## 2019-02-02 LAB — CULTURE, BLOOD (ROUTINE X 2)
Culture: NO GROWTH
Culture: NO GROWTH
Special Requests: ADEQUATE
Special Requests: ADEQUATE

## 2019-02-06 ENCOUNTER — Other Ambulatory Visit: Payer: Self-pay

## 2019-02-06 ENCOUNTER — Encounter (INDEPENDENT_AMBULATORY_CARE_PROVIDER_SITE_OTHER): Payer: Self-pay | Admitting: Internal Medicine

## 2019-02-06 ENCOUNTER — Ambulatory Visit (INDEPENDENT_AMBULATORY_CARE_PROVIDER_SITE_OTHER): Payer: 59 | Admitting: Internal Medicine

## 2019-02-06 VITALS — BP 140/80 | HR 72 | Ht 72.0 in | Wt 203.2 lb

## 2019-02-06 DIAGNOSIS — I5023 Acute on chronic systolic (congestive) heart failure: Secondary | ICD-10-CM

## 2019-02-06 DIAGNOSIS — E119 Type 2 diabetes mellitus without complications: Secondary | ICD-10-CM

## 2019-02-06 DIAGNOSIS — I480 Paroxysmal atrial fibrillation: Secondary | ICD-10-CM | POA: Diagnosis not present

## 2019-02-06 DIAGNOSIS — N184 Chronic kidney disease, stage 4 (severe): Secondary | ICD-10-CM | POA: Diagnosis not present

## 2019-02-06 DIAGNOSIS — E039 Hypothyroidism, unspecified: Secondary | ICD-10-CM | POA: Diagnosis not present

## 2019-02-06 DIAGNOSIS — E559 Vitamin D deficiency, unspecified: Secondary | ICD-10-CM | POA: Insufficient documentation

## 2019-02-06 DIAGNOSIS — E1122 Type 2 diabetes mellitus with diabetic chronic kidney disease: Secondary | ICD-10-CM

## 2019-02-06 HISTORY — DX: Vitamin D deficiency, unspecified: E55.9

## 2019-02-06 HISTORY — DX: Type 2 diabetes mellitus without complications: E11.9

## 2019-02-06 NOTE — Progress Notes (Signed)
Wellness Office Visit  Subjective:  Patient ID: Lawrence Bonilla, male    DOB: 1951-01-26  Age: 68 y.o. MRN: [id number]  CC: This man comes in for follow-up after his recent hospitalization.  He was admitted with diagnosis of acute on chronic congestive heart failure and he was discharged 5 days ago from the hospital. HPI He still feels fatigued and is due to follow-up with cardiology in the next couple of weeks or so.  He also has seen nephrology.  He essentially has almost end-stage chronic kidney disease which may need him to have dialysis. He also has hypothyroidism and diabetes for which I see him for.  His diabetes has not required any medications as his A1c was 6% on the last visit about 3 months ago.  I have been prescribing lower dose of desiccated NP thyroid for his hypothyroidism.  He still feels he lacks energy. He also has vitamin D deficiency and he is taking vitamin D3 supplementation. He tells me that he does not feel significantly improved since discharge from the hospital.  He has home health coming in now.  Past Medical History:  Diagnosis Date  . Atrial fibrillation ([place])   . Congestive heart failure (CHF) ([place])   . DM (diabetes mellitus), type 2 ([place])   . DM type 2 (diabetes mellitus, type 2) ([place]) 02/06/2019  . Essential hypertension   . Hyperlipidemia   . Hypothyroidism   . Hypothyroidism, adult 02/06/2019  . Lymphedema   . PVD (peripheral vascular disease) ([place])   . Vitamin D deficiency disease 02/06/2019      Family History  Problem Relation Age of Onset  . 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     Current Meds  Medication Sig  . amLODipine (NORVASC) 5 MG tablet Take 1 tablet (5 mg total) by mouth daily.  Marland Kitchen atorvastatin (LIPITOR) 40 MG tablet Take 40 mg by mouth daily.  . Eliquis DVT/PE Starter Pack (ELIQUIS STARTER  PACK) 5 MG TABS Take as directed on package: start with two-5mg  tablets twice daily for 7 days. On day 8, switch to one-5mg  tablet twice daily.  . furosemide (LASIX) 40 MG tablet Take 80 mg am ( 2 tablets) and 40 mg pm (1 tablet)  . isosorbide mononitrate (IMDUR) 30 MG 24 hr tablet Take 30 mg by mouth daily.  . metoprolol succinate (TOPROL-XL) 100 MG 24 hr tablet Take 100 mg by mouth 2 (two) times daily. Take with or immediately following a meal.  . NP THYROID 30 MG tablet TK 1 T PO QD  . sacubitril-valsartan (ENTRESTO) 24-26 MG Take 1 tablet by mouth 2 (two) times daily.  . Vitamin D, Ergocalciferol, (DRISDOL) 1.25 MG (50000 UT) CAPS capsule Take 1 capsule by mouth once a week.       Objective:   Today's Vitals: BP 140/80   Pulse 72   Ht 6' (1.829 m)   Wt 203 lb 3.2 oz (92.2 kg)   BMI 27.56 kg/m  Vitals with BMI 02/06/2019 02/01/2019 02/01/2019  Height 6\' 0"  - -  Weight 203 lbs 3 oz - 204 lbs 5 oz  BMI A999333 - [id number]  Systolic [id number] A999333 -  Diastolic 80 99991111 -  Pulse 72 71 -     Physical Exam He looks chronically unwell.  His blood pressure is reasonable for his age.  Heart sounds are present  and appear to be in atrial fibrillation.  Lung fields are clear except for right lower zone where air entry is reduced.  Chest x-ray had shown pleural effusion on that side with atelectasis.  He is alert and orientated without any focal neurological signs.      Assessment   1. Paroxysmal atrial fibrillation (HCC)   2. Acute on chronic systolic CHF (congestive heart failure) ([place])   3. CKD (chronic kidney disease), stage IV ([place])   4. Hypothyroidism, adult   5. Type 2 diabetes mellitus with stage 4 chronic kidney disease, without long-term current use of insulin ([place])   6. Vitamin D deficiency disease      Plan: 1. For the time being, he will continue with all medications for his chronic conditions above. 2. Blood work is ordered today as outlined below. 3. He will follow-up with me in  about 3 months time and further recommendations will depend on blood results.  Tests ordered Orders Placed This Encounter  Procedures  . Hemoglobin A1c  . T3, free  . TSH  . VITAMIN D 25 Hydroxy (Vit-D Deficiency, Fractures)  . COMPLETE METABOLIC PANEL WITH GFR     Brandom Kerwin Luther Parody, MD

## 2019-02-07 LAB — COMPLETE METABOLIC PANEL WITH GFR
AG Ratio: 1.3 (calc) (ref 1.0–2.5)
ALT: 23 U/L (ref 9–46)
AST: 33 U/L (ref 10–35)
Albumin: 3.7 g/dL (ref 3.6–5.1)
Alkaline phosphatase (APISO): 120 U/L (ref 35–144)
BUN/Creatinine Ratio: 24 (calc) — ABNORMAL HIGH (ref 6–22)
BUN: 56 mg/dL — ABNORMAL HIGH (ref 7–25)
CO2: 28 mmol/L (ref 20–32)
Calcium: 9.1 mg/dL (ref 8.6–10.3)
Chloride: 102 mmol/L (ref 98–110)
Creat: 2.31 mg/dL — ABNORMAL HIGH (ref 0.70–1.25)
GFR, Est African American: 32 mL/min/{1.73_m2} — ABNORMAL LOW (ref >=60)
GFR, Est Non African American: 28 mL/min/{1.73_m2} — ABNORMAL LOW (ref >=60)
Globulin: 2.8 g/dL (calc) (ref 1.9–3.7)
Glucose, Bld: 124 mg/dL — ABNORMAL HIGH (ref 65–99)
Potassium: 3.8 mmol/L (ref 3.5–5.3)
Sodium: 142 mmol/L (ref 135–146)
Total Bilirubin: 2.6 mg/dL — ABNORMAL HIGH (ref 0.2–1.2)
Total Protein: 6.5 g/dL (ref 6.1–8.1)

## 2019-02-07 LAB — TSH: TSH: 7.18 mIU/L — ABNORMAL HIGH (ref 0.40–4.50)

## 2019-02-07 LAB — T3, FREE: T3, Free: 2.7 pg/mL (ref 2.3–4.2)

## 2019-02-07 LAB — HEMOGLOBIN A1C
Hgb A1c MFr Bld: 6.2 % of total Hgb — ABNORMAL HIGH (ref <5.7)
Mean Plasma Glucose: 131 (calc)
eAG (mmol/L): 7.3 (calc)

## 2019-02-07 LAB — VITAMIN D 25 HYDROXY (VIT D DEFICIENCY, FRACTURES): Vit D, 25-Hydroxy: 68 ng/mL (ref 30–100)

## 2019-02-08 ENCOUNTER — Encounter (INDEPENDENT_AMBULATORY_CARE_PROVIDER_SITE_OTHER): Payer: Self-pay | Admitting: Internal Medicine

## 2019-02-08 ENCOUNTER — Other Ambulatory Visit (INDEPENDENT_AMBULATORY_CARE_PROVIDER_SITE_OTHER): Payer: Self-pay | Admitting: Internal Medicine

## 2019-02-08 MED ORDER — THYROID 60 MG PO TABS: 60.0000 mg | ORAL_TABLET | Freq: Every day | ORAL | 3 refills | Status: DC

## 2019-02-11 ENCOUNTER — Other Ambulatory Visit (INDEPENDENT_AMBULATORY_CARE_PROVIDER_SITE_OTHER): Payer: Self-pay

## 2019-02-11 DIAGNOSIS — I5023 Acute on chronic systolic (congestive) heart failure: Secondary | ICD-10-CM

## 2019-02-11 DIAGNOSIS — I480 Paroxysmal atrial fibrillation: Secondary | ICD-10-CM

## 2019-02-11 DIAGNOSIS — I10 Essential (primary) hypertension: Secondary | ICD-10-CM

## 2019-02-11 MED ORDER — METOPROLOL SUCCINATE ER 100 MG PO TB24: 100.0000 mg | ORAL_TABLET | Freq: Two times a day (BID) | ORAL | 3 refills | Status: DC

## 2019-02-18 ENCOUNTER — Emergency Department (HOSPITAL_COMMUNITY): Payer: 59

## 2019-02-18 ENCOUNTER — Emergency Department (HOSPITAL_COMMUNITY)
Admission: EM | Admit: 2019-02-18 | Discharge: 2019-02-19 | Disposition: A | Discharge status: Home / Self Care | Payer: 59 | Attending: Emergency Medicine | Admitting: Emergency Medicine

## 2019-02-18 ENCOUNTER — Other Ambulatory Visit: Payer: Self-pay

## 2019-02-18 ENCOUNTER — Telehealth: Payer: Self-pay | Admitting: Cardiology

## 2019-02-18 ENCOUNTER — Encounter (HOSPITAL_COMMUNITY): Payer: Self-pay

## 2019-02-18 DIAGNOSIS — Z7901 Long term (current) use of anticoagulants: Secondary | ICD-10-CM | POA: Diagnosis not present

## 2019-02-18 DIAGNOSIS — I5022 Chronic systolic (congestive) heart failure: Secondary | ICD-10-CM | POA: Diagnosis not present

## 2019-02-18 DIAGNOSIS — Z79899 Other long term (current) drug therapy: Secondary | ICD-10-CM | POA: Diagnosis not present

## 2019-02-18 DIAGNOSIS — N184 Chronic kidney disease, stage 4 (severe): Secondary | ICD-10-CM | POA: Diagnosis not present

## 2019-02-18 DIAGNOSIS — E1122 Type 2 diabetes mellitus with diabetic chronic kidney disease: Secondary | ICD-10-CM | POA: Diagnosis not present

## 2019-02-18 DIAGNOSIS — I130 Hypertensive heart and chronic kidney disease with heart failure and stage 1 through stage 4 chronic kidney disease, or unspecified chronic kidney disease: Secondary | ICD-10-CM | POA: Insufficient documentation

## 2019-02-18 DIAGNOSIS — R634 Abnormal weight loss: Secondary | ICD-10-CM | POA: Diagnosis not present

## 2019-02-18 DIAGNOSIS — R05 Cough: Secondary | ICD-10-CM | POA: Insufficient documentation

## 2019-02-18 DIAGNOSIS — E039 Hypothyroidism, unspecified: Secondary | ICD-10-CM | POA: Diagnosis not present

## 2019-02-18 DIAGNOSIS — R059 Cough, unspecified: Secondary | ICD-10-CM

## 2019-02-18 LAB — CBC
HCT: 44 % (ref 39.0–52.0)
Hemoglobin: 13.6 g/dL (ref 13.0–17.0)
MCH: 34.4 pg — ABNORMAL HIGH (ref 26.0–34.0)
MCHC: 30.9 g/dL (ref 30.0–36.0)
MCV: 111.4 fL — ABNORMAL HIGH (ref 80.0–100.0)
Platelets: 92 10*3/uL — ABNORMAL LOW (ref 150–400)
RBC: 3.95 MIL/uL — ABNORMAL LOW (ref 4.22–5.81)
RDW: 15.2 % (ref 11.5–15.5)
WBC: 4.8 10*3/uL (ref 4.0–10.5)
nRBC: 0 % (ref 0.0–0.2)

## 2019-02-18 LAB — BASIC METABOLIC PANEL
Anion gap: 10 (ref 5–15)
BUN: 40 mg/dL — ABNORMAL HIGH (ref 8–23)
CO2: 30 mmol/L (ref 22–32)
Calcium: 8.8 mg/dL — ABNORMAL LOW (ref 8.9–10.3)
Chloride: 102 mmol/L (ref 98–111)
Creatinine, Ser: 2.14 mg/dL — ABNORMAL HIGH (ref 0.61–1.24)
GFR calc Af Amer: 36 mL/min — ABNORMAL LOW (ref >60)
GFR calc non Af Amer: 31 mL/min — ABNORMAL LOW (ref >60)
Glucose, Bld: 119 mg/dL — ABNORMAL HIGH (ref 70–99)
Potassium: 4 mmol/L (ref 3.5–5.1)
Sodium: 142 mmol/L (ref 135–145)

## 2019-02-18 LAB — PROTIME-INR
INR: 1.3 — ABNORMAL HIGH (ref 0.8–1.2)
Prothrombin Time: 16.1 seconds — ABNORMAL HIGH (ref 11.4–15.2)

## 2019-02-18 MED ORDER — BENZONATATE 100 MG PO CAPS
100.0000 mg | ORAL_CAPSULE | Freq: Once | ORAL | Status: AC
  Administered 2019-02-18: 100 mg via ORAL
  Filled 2019-02-18: qty 1

## 2019-02-18 NOTE — Telephone Encounter (Signed)
Patient currently in the ED for CHF

## 2019-02-18 NOTE — Telephone Encounter (Signed)
Patient asking to speak with someone about his coumadin and them wanting to start hIm Eliquis

## 2019-02-18 NOTE — ED Notes (Signed)
Patient transported to X-ray 

## 2019-02-18 NOTE — ED Notes (Signed)
Dyspnea and hypoxia improved, oxygen therapy titrated back to 2 lpm via [place].

## 2019-02-18 NOTE — ED Provider Notes (Signed)
[facility] EMERGENCY DEPARTMENT Provider Note   CSN: [id number] Arrival date & time: 02/18/19  1555     History   Chief Complaint Chief Complaint  Patient presents with  . Cough  . Weight Loss    HPI Lawrence Bonilla is a 68 y.o. male.     68 y/o male with hx of CHF (LVEF 25-30%), DM, HLD, HTN, Afib (chronically anticoagulated on Eliquis), suspected PE on VQ scan (now on chronic supplemental O2), hypothyroid presents to the emergency department for evaluation of cough and weight loss.  He states that he has lost approximately 10 pounds in the last week.  Does endorse having less of an appetite and has been on Lasix for diuresis and management of CHF.  Is unsure if this weight loss was anticipated for him or not.  He has felt persistently fatigued, though this is not specifically new for him.  His fatigue will wax and wane in severity.  Patient also expresses concern about a persistent dry cough.  This is aggravated with speaking.  Cough is productive of clear phlegm and sometimes will cause posttussive emesis.  He has had issues with this cough for the past 9 months.  Reports having 2 similar episodes of persistent cough in the past 10 to 15 years.  He has not had any fevers since hospital discharge earlier this month.  Denies any associated chest pain, hemoptysis, syncope.  Does have some dyspnea on exertion, but reports ambulating without his supplemental oxygen.  The history is provided by the patient. No language interpreter was used.  Cough   Past Medical History:  Diagnosis Date  . Atrial fibrillation ([place])   . Congestive heart failure (CHF) ([place])   . DM (diabetes mellitus), type 2 ([place])   . DM type 2 (diabetes mellitus, type 2) ([place]) 02/06/2019  . Essential hypertension   . Hyperlipidemia   . Hypothyroidism   . Hypothyroidism, adult 02/06/2019  . Lymphedema   . PVD (peripheral vascular disease) ([place])   . Vitamin D deficiency disease 02/06/2019     Patient Active Problem List   Diagnosis Date Noted  . Hypothyroidism, adult 02/06/2019  . DM type 2 (diabetes mellitus, type 2) ([place]) 02/06/2019  . Vitamin D deficiency disease 02/06/2019  . Acute on chronic systolic CHF (congestive heart failure) ([place]) 01/28/2019  . CKD (chronic kidney disease), stage IV ([place]) 01/28/2019  . Pleural effusion on right 01/28/2019  . Stroke ([place]) 01/28/2019  . Supratherapeutic INR 01/28/2019  . Atrial fibrillation ([place]) 01/17/2019  . Encounter for therapeutic drug monitoring 01/17/2019    Past Surgical History:  Procedure Laterality Date  . APPENDECTOMY    . BARIATRIC SURGERY          Home Medications    Prior to Admission medications   Medication Sig Start Date End Date Taking? Authorizing Provider  amLODipine (NORVASC) 5 MG tablet Take 1 tablet (5 mg total) by mouth daily. 02/02/19 03/04/19  Darliss Cheney, MD  atorvastatin (LIPITOR) 40 MG tablet Take 40 mg by mouth daily.    [provider]  Eliquis DVT/PE Starter Pack (ELIQUIS STARTER PACK) 5 MG TABS Take as directed on package: start with two-5mg  tablets twice daily for 7 days. On day 8, switch to one-5mg  tablet twice daily. 02/01/19   Darliss Cheney, MD  furosemide (LASIX) 40 MG tablet Take 80 mg am ( 2 tablets) and 40 mg pm (1 tablet) 01/25/19   [name], MD  isosorbide mononitrate (IMDUR) 30  MG 24 hr tablet Take 30 mg by mouth daily.    [provider]  metoprolol succinate (TOPROL-XL) 100 MG 24 hr tablet Take 1 tablet (100 mg total) by mouth 2 (two) times daily. Take with or immediately following a meal. 02/11/19   Gosrani, Nimish C, MD  sacubitril-valsartan (ENTRESTO) 24-26 MG Take 1 tablet by mouth 2 (two) times daily. 01/25/19   [name], MD  thyroid (NP THYROID) 60 MG tablet Take 1 tablet (60 mg total) by mouth daily before breakfast. 02/08/19   Doree Albee, MD  Vitamin D, Ergocalciferol, (DRISDOL) 1.25 MG (50000 UT) CAPS capsule Take 1 capsule by mouth  once a week. 01/11/19   [provider]    Family History Family History  Problem Relation Age of Onset  . Asthma Mother     Social History Social History   Tobacco Use  . Smoking status: Never Smoker  . Smokeless tobacco: Never Used  Substance Use Topics  . Alcohol use: Yes    Comment: 2 SCOTCH   . Drug use: Never     Allergies   Patient has no known allergies.   Review of Systems Review of Systems  Respiratory: Positive for cough.   Ten systems reviewed and are negative for acute change, except as noted in the HPI.    Physical Exam Updated Vital Signs BP (!) 135/99   Pulse 65   Temp 98.1 F (36.7 C) (Oral)   Resp 18   SpO2 97%   Physical Exam Vitals signs and nursing note reviewed.  Constitutional:      General: He is not in acute distress.    Appearance: He is well-developed. He is not diaphoretic.     Comments: Nontoxic appearing and in NAD  HENT:     Head: Normocephalic and atraumatic.  Eyes:     General: No scleral icterus.    Conjunctiva/sclera: Conjunctivae normal.  Neck:     Musculoskeletal: Normal range of motion.  Cardiovascular:     Rate and Rhythm: Normal rate. Rhythm irregularly irregular.     Pulses: Normal pulses.     Comments: Rate controlled Afib on monitor Pulmonary:     Effort: Pulmonary effort is normal. No respiratory distress.     Comments: Respirations even and unlabored. Persistent dry, nonproductive cough provoked with prolonged speech. Lungs grossly CTAB. SpO2 96-99% on 2L via [place]. Musculoskeletal: Normal range of motion.     Right lower leg: Edema present.     Left lower leg: Edema present.     Comments: 2+ pitting BLE  Skin:    General: Skin is warm and dry.     Coloration: Skin is not pale.     Findings: No erythema or rash.  Neurological:     Mental Status: He is alert and oriented to person, place, and time.  Psychiatric:        Behavior: Behavior normal.      ED Treatments / Results  Labs (all labs  ordered are listed, but only abnormal results are displayed) Labs Reviewed  CBC - Abnormal; Notable for the following components:      Result Value   RBC 3.95 (*)    MCV 111.4 (*)    MCH 34.4 (*)    Platelets 92 (*)    All other components within normal limits  BASIC METABOLIC PANEL - Abnormal; Notable for the following components:   Glucose, Bld 119 (*)    BUN 40 (*)    Creatinine,  Ser 2.14 (*)    Calcium 8.8 (*)    GFR calc non Af Amer 31 (*)    GFR calc Af Amer 36 (*)    All other components within normal limits  PROTIME-INR - Abnormal; Notable for the following components:   Prothrombin Time 16.1 (*)    INR 1.3 (*)    All other components within normal limits  BRAIN NATRIURETIC PEPTIDE    EKG EKG Interpretation  Date/Time:  Monday February 18 2019 16:27:46 EDT Ventricular Rate:  68 PR Interval:    QRS Duration: 112 QT Interval:  454 QTC Calculation: 482 R Axis:   -68 Text Interpretation:  Atrial fibrillation Left anterior fascicular block ST & T wave abnormality, consider inferior ischemia or digitalis effect Prolonged QT Abnormal ECG Confirmed by Carmin Muskrat [phone]) on 02/18/2019 10:58:47 PM   Radiology Dg Chest 2 View  Result Date: 02/18/2019 CLINICAL DATA:  Cough EXAM: CHEST - 2 VIEW COMPARISON:  01/31/2019, 01/28/2019 FINDINGS: Small moderate bilateral pleural effusions, slightly increased on the left side. Increased left basilar airspace disease, no change dense airspace disease right base. Enlarged cardiomediastinal silhouette with vascular congestion. Mild interstitial edema. No pneumothorax. IMPRESSION: 1. Cardiomegaly with vascular congestion and mild interstitial edema. 2. Bilateral pleural effusions, small moderate in size. Effusion appears stable on the right and slightly increased on the left. Persistent basilar airspace disease, slightly worse at the left base. Electronically Signed   By: Donavan Foil M.D.   On: 02/18/2019 23:08    Procedures  Procedures (including critical care time)  Medications Ordered in ED Medications  benzonatate (TESSALON) capsule 100 mg (has no administration in time range)     Initial Impression / Assessment and Plan / ED Course  I have reviewed the triage vital signs and the nursing notes.  Pertinent labs & imaging results that were available during my care of the patient were reviewed by me and considered in my medical decision making (see chart for details).        68 year old male presents to the emergency department for multiple complaints.  Mostly concerned with persistent cough which has been present for the past 9 months.  History of recent admission for acute CHF exacerbation.  BNP at this time was greater than 4500.  He was discharged on supplemental oxygen.  Reports feeling short of breath with ambulation, but also does not use his oxygen when he is exerting himself because he does not like pulling the oxygen behind him.  He finds this cumbersome.  Patient is clinically well-appearing in the emergency department with stable oxygen saturations on his home supplemental O2.  He has no fevers.  No evidence of pneumonia on chest x-ray.  Also without leukocytosis to suggest infectious etiology.  Patient does have findings consistent with persistent heart failure, though I do not appreciate this to be acutely worsened since his discharge.  His BNP has actually improved.  The patient's cough has improved in the ED following use of Tessalon.  I do not feel that he requires admission at this time for urgent/emergent work-up.  Feel he is stable for continued follow-up with his primary care doctor.  Patient is comfortable with this plan.  Will provide prescription for short course of Tessalon.  Return precautions discussed and provided. Patient discharged in stable condition with no unaddressed concerns.   Final Clinical Impressions(s) / ED Diagnoses   Final diagnoses:  Cough  Chronic systolic heart  failure [facility])    ED Discharge Orders  Ordered    benzonatate (TESSALON) 100 MG capsule  3 times daily PRN     02/19/19 0107           Antonietta Breach, PA-C 02/27/19 0359    Carmin Muskrat, MD 02/28/19 [phone]

## 2019-02-18 NOTE — ED Notes (Signed)
Pt ambulated to restroom using personal cane. O2 saturation 86-88% on return to bed. Oxygen administered via [place].

## 2019-02-18 NOTE — ED Notes (Signed)
Pt states he is on 2 liters of 02 at hm. Nurse notified, pt placed on 2 liters

## 2019-02-18 NOTE — ED Triage Notes (Signed)
Pt reports recent admission for CHF but now states his cough is worse and he has lost 10 lbs in the last week, dry heaving. No abd pain

## 2019-02-19 LAB — BRAIN NATRIURETIC PEPTIDE: B Natriuretic Peptide: 3727 pg/mL — ABNORMAL HIGH (ref 0.0–100.0)

## 2019-02-19 MED ORDER — BENZONATATE 100 MG PO CAPS: 100.0000 mg | ORAL_CAPSULE | Freq: Three times a day (TID) | ORAL | 0 refills | Status: DC | PRN

## 2019-02-19 NOTE — Discharge Instructions (Signed)
It is important that you are utilizing your supplemental oxygen when you are exerting yourself or walking to prevent your oxygen level from dropping.  You were given a prescription for Tessalon to take for management of persistent cough.  Continue your other daily prescribed medications including your Lasix.  It appears that your Lasix is improving/lessening the amount of fluid you are retaining, though this has not completely resolved.  Your weight loss may be attributed to your lack of appetite as well as diuresis causing loss of water weight.  We strongly recommend that you follow-up with your primary care doctor by the end of the week for repeat evaluation.  You may return to the ED for any new or concerning symptoms.

## 2019-02-21 NOTE — Telephone Encounter (Signed)
Pt has been back in hospital and is on Eliquis due to recent PE with therapeutic INR on Warfarin.

## 2019-02-25 DIAGNOSIS — I480 Paroxysmal atrial fibrillation: Secondary | ICD-10-CM

## 2019-02-25 DIAGNOSIS — J918 Pleural effusion in other conditions classified elsewhere: Secondary | ICD-10-CM | POA: Diagnosis not present

## 2019-02-25 DIAGNOSIS — I5023 Acute on chronic systolic (congestive) heart failure: Secondary | ICD-10-CM

## 2019-02-25 DIAGNOSIS — I6350 Cerebral infarction due to unspecified occlusion or stenosis of unspecified cerebral artery: Secondary | ICD-10-CM

## 2019-02-25 DIAGNOSIS — N189 Chronic kidney disease, unspecified: Secondary | ICD-10-CM | POA: Diagnosis not present

## 2019-02-27 ENCOUNTER — Other Ambulatory Visit (INDEPENDENT_AMBULATORY_CARE_PROVIDER_SITE_OTHER): Payer: Self-pay | Admitting: Internal Medicine

## 2019-02-27 ENCOUNTER — Other Ambulatory Visit (INDEPENDENT_AMBULATORY_CARE_PROVIDER_SITE_OTHER): Payer: Self-pay

## 2019-02-27 ENCOUNTER — Telehealth (INDEPENDENT_AMBULATORY_CARE_PROVIDER_SITE_OTHER): Payer: Self-pay | Admitting: Internal Medicine

## 2019-02-27 MED ORDER — ENTRESTO 24-26 MG PO TABS: 1.0000 | ORAL_TABLET | Freq: Two times a day (BID) | ORAL | 6 refills | Status: DC

## 2019-02-27 NOTE — Progress Notes (Unsigned)
Pt called left voice mail to have the Eliquis & Amlodipine refill.

## 2019-02-28 NOTE — Telephone Encounter (Signed)
Done

## 2019-03-01 ENCOUNTER — Other Ambulatory Visit: Payer: Self-pay

## 2019-03-01 ENCOUNTER — Ambulatory Visit: Payer: Medicare HMO | Admitting: Cardiology

## 2019-03-01 ENCOUNTER — Encounter: Payer: Self-pay | Admitting: Cardiology

## 2019-03-01 VITALS — BP 123/87 | HR 60 | Ht 72.0 in | Wt 194.0 lb

## 2019-03-01 DIAGNOSIS — N1832 Chronic kidney disease, stage 3b: Secondary | ICD-10-CM | POA: Diagnosis not present

## 2019-03-01 DIAGNOSIS — Z86711 Personal history of pulmonary embolism: Secondary | ICD-10-CM | POA: Diagnosis not present

## 2019-03-01 DIAGNOSIS — I4821 Permanent atrial fibrillation: Secondary | ICD-10-CM | POA: Diagnosis not present

## 2019-03-01 DIAGNOSIS — I5022 Chronic systolic (congestive) heart failure: Secondary | ICD-10-CM | POA: Diagnosis not present

## 2019-03-01 DIAGNOSIS — I890 Lymphedema, not elsewhere classified: Secondary | ICD-10-CM

## 2019-03-01 DIAGNOSIS — I5023 Acute on chronic systolic (congestive) heart failure: Secondary | ICD-10-CM

## 2019-03-01 MED ORDER — FUROSEMIDE 40 MG PO TABS: 80.0000 mg | ORAL_TABLET | Freq: Two times a day (BID) | ORAL | 3 refills | Status: DC

## 2019-03-01 NOTE — Patient Instructions (Addendum)
Medication Instructions:   Your physician has recommended you make the following change in your medication:   Increase furosemide to 80 mg by mouth twice daily. Take (2) of your 40 mg tablets twice daily  Continue all other medications the same  Labwork:  Your physician recommends that you return for non-fasting lab work in: 7 days to check your BMET. Please have this done at [facility] or [facility] in [place].  Testing/Procedures:  NONE  Follow-Up:  Your physician recommends that you schedule a follow-up appointment in: 6 weeks.  You have been referred to the Heart Failure Clinic.  Any Other Special Instructions Will Be Listed Below (If Applicable).  If you need a refill on your cardiac medications before your next appointment, please call your pharmacy.

## 2019-03-01 NOTE — Progress Notes (Signed)
Cardiology Office Note  Date: 03/01/2019   ID: Lawrence Bonilla, DOB 28-Mar-1951, MRN [id number]  PCP:  Doree Albee, MD  Cardiologist:  Rozann Lesches, MD Electrophysiologist:  None   Chief Complaint  Patient presents with  . Cardiac follow-up    History of Present Illness: Lawrence Bonilla is a 68 y.o. male that I met recently in August in referral from Dr. Anastasio Champion.  I reviewed interval records.  He was hospitalized in September with worsening shortness of breath, shoulder and chest discomfort.  He was felt to have acute on chronic systolic heart failure with minor nondiagnostic high-sensitivity troponin I elevations in flat pattern, and was ultimately diagnosed with pulmonary embolus based on high probability VQ scan.  He was transitioned from Coumadin ultimately to Eliquis.  I did not receive any records regarding prior cardiac work-up in [place].  Echocardiogram performed back in August demonstrated LVEF 25 to 30%, also mild RV dysfunction, severe biatrial enlargement, and moderate to severe pulmonary hypertension.  He was switched from ACE inhibitor to [facility] in the setting of persistent renal insufficiency and he has tolerated this change so far.  He reports persistent dry cough, no hemoptysis.  He describes NYHA class III dyspnea on exertion.  He also went through a period of time reporting poor appetite and weight loss, was seen in the ER and felt to be possibly dehydrated, but actually his creatinine was better at that time.  Chest x-ray with that ER visit showed cardiomegaly with vascular congestion and interstitial edema as well as bilateral pleural effusions of small to moderate size.  LFTs were normal in early September.  Creatinine has fluctuated from a peak of 2.63 on September 10 down to 2.14 on September 28.  This was on both Lasix and Entresto.  I talked with him today about the findings of his echocardiogram in August and also his most recent hospital stay.  I  do not know what his prior baseline LVEF was, or for that matter whether he underwent previous ischemic testing.  Past Medical History:  Diagnosis Date  . Atrial fibrillation ([place])   . Congestive heart failure (CHF) ([place])   . DM (diabetes mellitus), type 2 ([place])   . DM type 2 (diabetes mellitus, type 2) ([place]) 02/06/2019  . Essential hypertension   . Hyperlipidemia   . Hypothyroidism   . Hypothyroidism, adult 02/06/2019  . Lymphedema   . PVD (peripheral vascular disease) ([place])   . Vitamin D deficiency disease 02/06/2019    Past Surgical History:  Procedure Laterality Date  . APPENDECTOMY    . BARIATRIC SURGERY      Current Outpatient Medications  Medication Sig Dispense Refill  . amLODipine (NORVASC) 5 MG tablet Take 1 tablet (5 mg total) by mouth daily. 30 tablet 0  . atorvastatin (LIPITOR) 40 MG tablet Take 40 mg by mouth daily.    . benzonatate (TESSALON) 100 MG capsule Take 1 capsule (100 mg total) by mouth 3 (three) times daily as needed for cough. 21 capsule 0  . calcitRIOL (ROCALTROL) 0.25 MCG capsule Take 0.25 mcg by mouth. 3 x week - Monday, Wednesday, Friday    . Eliquis DVT/PE Starter Pack (ELIQUIS STARTER PACK) 5 MG TABS Take as directed on package: start with two-38m tablets twice daily for 7 days. On day 8, switch to one-599mtablet twice daily. 1 each 0  . furosemide (LASIX) 40 MG tablet Take 2 tablets (80 mg total) by mouth 2 (two) times daily. 12[place]  tablet 3  . isosorbide mononitrate (IMDUR) 30 MG 24 hr tablet Take 30 mg by mouth daily.    . metoprolol succinate (TOPROL-XL) 100 MG 24 hr tablet Take 1 tablet (100 mg total) by mouth 2 (two) times daily. Take with or immediately following a meal. 60 tablet 3  . potassium chloride (MICRO-K) 10 MEQ CR capsule Take 10 mEq by mouth daily.     . sacubitril-valsartan (ENTRESTO) 24-26 MG Take 1 tablet by mouth 2 (two) times daily. 60 tablet 6  . thyroid (ARMOUR) 30 MG tablet Take 30 mg by mouth 2 (two) times daily.     No current  facility-administered medications for this visit.    Allergies:  Patient has no known allergies.   Social History: The patient  reports that he has never smoked. He has never used smokeless tobacco. He reports current alcohol use. He reports that he does not use drugs.   Family History: The patient's family history includes Asthma in his mother.   ROS:  Please see the history of present illness. Otherwise, complete review of systems is positive for fatigue, dyspnea on exertion.  All other systems are reviewed and negative.   Physical Exam: VS:  BP 123/87   Pulse 60   Ht 6' (1.829 m)   Wt 194 lb (88 kg)   SpO2 95%   BMI 26.31 kg/m , BMI Body mass index is 26.31 kg/m.  Wt Readings from Last 3 Encounters:  03/01/19 194 lb (88 kg)  02/06/19 203 lb 3.2 oz (92.2 kg)  02/01/19 204 lb 4.8 oz (92.7 kg)    General: Chronically ill-appearing male in no distress. HEENT: Conjunctiva and lids normal, wearing a mask. Neck: Supple, elevated JVP, no carotid bruits. Lungs: Breath sounds at the bases, nonlabored breathing at rest. Cardiac: Irregular, no S3, soft apical systolic murmur, no pericardial rub. Abdomen: Soft, nontender, bowel sounds present, no guarding or rebound. Extremities: Lymphedema as before with venous stasis, distal pulses diminished. Skin: Warm and dry. Musculoskeletal: Kyphosis present. Neuropsychiatric: Alert and oriented x3, affect grossly appropriate.  ECG:  An ECG dated 02/18/2019 was personally reviewed today and demonstrated:  Atrial fibrillation with left anterior fascicular block and nonspecific ST-T changes.  Recent Labwork: 01/28/2019: Magnesium 2.4 02/06/2019: ALT 23; AST 33; TSH 7.18 02/18/2019: B Natriuretic Peptide 3,727.0; BUN 40; Creatinine, Ser 2.14; Hemoglobin 13.6; Platelets 92; Potassium 4.0; Sodium 142   Other Studies Reviewed Today:  Echocardiogram 01/10/2019:  1. The left ventricle has severely reduced systolic function, with an ejection fraction of  25-30%. The cavity size was mildly dilated. Left ventricular diastolic Doppler parameters are indeterminate.  2. The right ventricle has mildly reduced systolic function. The cavity was moderately enlarged. There is no increase in right ventricular wall thickness.  3. Left atrial size was severely dilated.  4. Right atrial size was severely dilated.  5. No evidence of mitral valve stenosis.  6. Tricuspid valve regurgitation is moderate.  7. The aortic valve is tricuspid. No stenosis of the aortic valve.  8. The aorta is normal unless otherwise noted.  9. The aortic root is normal in size and structure. 10. Pulmonary hypertension is moderately elevated, PASP is 63 mmHg.  Assessment and Plan:  1.  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  Duration and etiology of cardiomyopathy are not certain, I do not have old records at this time.  Current cardiac regimen includes Toprol-XL, Norvasc, Imdur,  Entresto, and Lasix with potassium supplements.  Renal insufficiency does limit therapy to some degree but has been stable recently.  Plan to increase Lasix to 80 mg twice daily for now, follow-up BMET in 7 days.  He is being referred to the advanced heart failure clinic for further evaluation.  He would be a challenging candidate for coronary angiography in light of renal insufficiency, but a right heart catheterization might ultimately be considered to help guide therapy.  Actually, I am concerned about low output symptoms.  2.  Recent diagnosis of pulmonary embolus based on high probability VQ scan during hospital stay in September.  He is now on Eliquis, was transitioned from Coumadin.  3.  CKD stage 3, most recent creatinine 2.14.  4.  Permanent atrial fibrillation.  Heart rate control is adequate on Toprol-XL.  He is now on Eliquis for stroke prophylaxis having been switched from Coumadin as outlined  above.  5.  Chronic lymphedema.  6.  Essential hypertension, systolic is in the 224W today.  Medication Adjustments/Labs and Tests Ordered: Current medicines are reviewed at length with the patient today.  Concerns regarding medicines are outlined above.   Tests Ordered: Orders Placed This Encounter  Procedures  . Basic metabolic panel  . AMB referral to CHF clinic    Medication Changes: Meds ordered this encounter  Medications  . DISCONTD: furosemide (LASIX) 40 MG tablet    Sig: Take 2 tablets (80 mg total) by mouth 2 (two) times daily. Take 80 mg am ( 2 tablets) and 40 mg pm (1 tablet)    Dispense:  120 tablet    Refill:  3    03/01/2019 dose increase  . furosemide (LASIX) 40 MG tablet    Sig: Take 2 tablets (80 mg total) by mouth 2 (two) times daily.    Dispense:  120 tablet    Refill:  3    03/01/2019 dose increase    Disposition:  Follow up 6 weeks in the [place] office.  Signed, [name], MD, [facility] 03/01/2019 4:19 PM    [facility] [place], [place], [place] [address] Phone: [phone]; Fax: [phone]

## 2019-03-04 ENCOUNTER — Telehealth (INDEPENDENT_AMBULATORY_CARE_PROVIDER_SITE_OTHER): Payer: Self-pay | Admitting: Internal Medicine

## 2019-03-04 ENCOUNTER — Other Ambulatory Visit (INDEPENDENT_AMBULATORY_CARE_PROVIDER_SITE_OTHER): Payer: Self-pay | Admitting: Internal Medicine

## 2019-03-04 MED ORDER — AMLODIPINE BESYLATE 5 MG PO TABS: 5.0000 mg | ORAL_TABLET | Freq: Every day | ORAL | 3 refills | Status: DC

## 2019-03-04 MED ORDER — APIXABAN 5 MG PO TABS: 5.0000 mg | ORAL_TABLET | Freq: Two times a day (BID) | ORAL | 3 refills | Status: DC

## 2019-03-04 MED ORDER — THYROID 60 MG PO TABS: 60.0000 mg | ORAL_TABLET | Freq: Every day | ORAL | 3 refills | Status: DC

## 2019-03-04 NOTE — Telephone Encounter (Signed)
Done

## 2019-03-07 ENCOUNTER — Ambulatory Visit: Payer: Self-pay | Admitting: *Deleted

## 2019-03-13 ENCOUNTER — Encounter: Payer: Self-pay | Admitting: *Deleted

## 2019-03-19 ENCOUNTER — Telehealth: Payer: Self-pay | Admitting: *Deleted

## 2019-03-19 NOTE — Telephone Encounter (Signed)
-----   Message from [name], MD sent at 03/17/2019 12:36 PM EDT ----- Results reviewed.  Creatinine actually looks better compared to prior check in September, now down to 1.87 with normal potassium.  Continue with current medication and plan for evaluation in the advanced heart failure clinic.

## 2019-03-19 NOTE — Telephone Encounter (Signed)
Patient informed. Copy sent to PCP & nephrologist Harrie Jeans.

## 2019-03-20 ENCOUNTER — Other Ambulatory Visit (INDEPENDENT_AMBULATORY_CARE_PROVIDER_SITE_OTHER): Payer: Self-pay | Admitting: Internal Medicine

## 2019-03-20 ENCOUNTER — Telehealth (INDEPENDENT_AMBULATORY_CARE_PROVIDER_SITE_OTHER): Payer: Self-pay | Admitting: Internal Medicine

## 2019-03-20 MED ORDER — THYROID 60 MG PO TABS: 60.0000 mg | ORAL_TABLET | Freq: Every day | ORAL | 3 refills | Status: DC

## 2019-03-20 MED ORDER — ISOSORBIDE MONONITRATE ER 30 MG PO TB24: 30.0000 mg | ORAL_TABLET | Freq: Every day | ORAL | 0 refills | Status: DC

## 2019-03-20 NOTE — Telephone Encounter (Signed)
Pt would like a refill of Thyroid & Imdur sent to [facility]

## 2019-03-25 ENCOUNTER — Ambulatory Visit (HOSPITAL_COMMUNITY)
Admission: RE | Admit: 2019-03-25 | Discharge: 2019-03-25 | Disposition: A | Discharge status: Home / Self Care | Payer: 59 | Source: Ambulatory Visit | Attending: Internal Medicine | Admitting: Internal Medicine

## 2019-03-25 ENCOUNTER — Encounter (HOSPITAL_COMMUNITY): Payer: Self-pay | Admitting: Internal Medicine

## 2019-03-25 ENCOUNTER — Telehealth (HOSPITAL_COMMUNITY): Payer: Self-pay | Admitting: Vascular Surgery

## 2019-03-25 ENCOUNTER — Other Ambulatory Visit: Payer: Self-pay

## 2019-03-25 ENCOUNTER — Other Ambulatory Visit (HOSPITAL_BASED_OUTPATIENT_CLINIC_OR_DEPARTMENT_OTHER): Payer: Self-pay

## 2019-03-25 VITALS — BP 110/78 | HR 64 | Wt 194.4 lb

## 2019-03-25 DIAGNOSIS — Z7901 Long term (current) use of anticoagulants: Secondary | ICD-10-CM | POA: Insufficient documentation

## 2019-03-25 DIAGNOSIS — N1832 Chronic kidney disease, stage 3b: Secondary | ICD-10-CM

## 2019-03-25 DIAGNOSIS — I272 Pulmonary hypertension, unspecified: Secondary | ICD-10-CM | POA: Diagnosis not present

## 2019-03-25 DIAGNOSIS — I425 Other restrictive cardiomyopathy: Secondary | ICD-10-CM | POA: Diagnosis not present

## 2019-03-25 DIAGNOSIS — Z6826 Body mass index (BMI) 26.0-26.9, adult: Secondary | ICD-10-CM | POA: Diagnosis not present

## 2019-03-25 DIAGNOSIS — I130 Hypertensive heart and chronic kidney disease with heart failure and stage 1 through stage 4 chronic kidney disease, or unspecified chronic kidney disease: Secondary | ICD-10-CM | POA: Diagnosis not present

## 2019-03-25 DIAGNOSIS — N184 Chronic kidney disease, stage 4 (severe): Secondary | ICD-10-CM | POA: Insufficient documentation

## 2019-03-25 DIAGNOSIS — Z79899 Other long term (current) drug therapy: Secondary | ICD-10-CM | POA: Diagnosis not present

## 2019-03-25 DIAGNOSIS — I4821 Permanent atrial fibrillation: Secondary | ICD-10-CM | POA: Insufficient documentation

## 2019-03-25 DIAGNOSIS — I5082 Biventricular heart failure: Secondary | ICD-10-CM | POA: Insufficient documentation

## 2019-03-25 DIAGNOSIS — E785 Hyperlipidemia, unspecified: Secondary | ICD-10-CM | POA: Insufficient documentation

## 2019-03-25 DIAGNOSIS — I10 Essential (primary) hypertension: Secondary | ICD-10-CM

## 2019-03-25 DIAGNOSIS — E1122 Type 2 diabetes mellitus with diabetic chronic kidney disease: Secondary | ICD-10-CM | POA: Insufficient documentation

## 2019-03-25 DIAGNOSIS — I890 Lymphedema, not elsewhere classified: Secondary | ICD-10-CM | POA: Diagnosis not present

## 2019-03-25 DIAGNOSIS — E1151 Type 2 diabetes mellitus with diabetic peripheral angiopathy without gangrene: Secondary | ICD-10-CM | POA: Diagnosis not present

## 2019-03-25 DIAGNOSIS — E039 Hypothyroidism, unspecified: Secondary | ICD-10-CM | POA: Insufficient documentation

## 2019-03-25 DIAGNOSIS — J9611 Chronic respiratory failure with hypoxia: Secondary | ICD-10-CM | POA: Insufficient documentation

## 2019-03-25 DIAGNOSIS — R0683 Snoring: Secondary | ICD-10-CM | POA: Diagnosis not present

## 2019-03-25 DIAGNOSIS — Z9884 Bariatric surgery status: Secondary | ICD-10-CM | POA: Diagnosis not present

## 2019-03-25 DIAGNOSIS — I5022 Chronic systolic (congestive) heart failure: Secondary | ICD-10-CM | POA: Diagnosis present

## 2019-03-25 LAB — CBC
HCT: 39.4 % (ref 39.0–52.0)
Hemoglobin: 12.2 g/dL — ABNORMAL LOW (ref 13.0–17.0)
MCH: 34.1 pg — ABNORMAL HIGH (ref 26.0–34.0)
MCHC: 31 g/dL (ref 30.0–36.0)
MCV: 110.1 fL — ABNORMAL HIGH (ref 80.0–100.0)
Platelets: 124 10*3/uL — ABNORMAL LOW (ref 150–400)
RBC: 3.58 MIL/uL — ABNORMAL LOW (ref 4.22–5.81)
RDW: 14.5 % (ref 11.5–15.5)
WBC: 4.3 10*3/uL (ref 4.0–10.5)
nRBC: 0 % (ref 0.0–0.2)

## 2019-03-25 LAB — COMPREHENSIVE METABOLIC PANEL
ALT: 19 U/L (ref 0–44)
AST: 26 U/L (ref 15–41)
Albumin: 3.5 g/dL (ref 3.5–5.0)
Alkaline Phosphatase: 90 U/L (ref 38–126)
Anion gap: 10 (ref 5–15)
BUN: 42 mg/dL — ABNORMAL HIGH (ref 8–23)
CO2: 27 mmol/L (ref 22–32)
Calcium: 9 mg/dL (ref 8.9–10.3)
Chloride: 104 mmol/L (ref 98–111)
Creatinine, Ser: 2.41 mg/dL — ABNORMAL HIGH (ref 0.61–1.24)
GFR calc Af Amer: 31 mL/min — ABNORMAL LOW (ref >60)
GFR calc non Af Amer: 27 mL/min — ABNORMAL LOW (ref >60)
Glucose, Bld: 108 mg/dL — ABNORMAL HIGH (ref 70–99)
Potassium: 4.3 mmol/L (ref 3.5–5.1)
Sodium: 141 mmol/L (ref 135–145)
Total Bilirubin: 1.2 mg/dL (ref 0.3–1.2)
Total Protein: 6.5 g/dL (ref 6.5–8.1)

## 2019-03-25 LAB — IRON AND TIBC
Iron: 117 ug/dL (ref 45–182)
Saturation Ratios: 39 % (ref 17.9–39.5)
TIBC: 301 ug/dL (ref 250–450)
UIBC: 184 ug/dL

## 2019-03-25 LAB — BRAIN NATRIURETIC PEPTIDE: B Natriuretic Peptide: 1838.8 pg/mL — ABNORMAL HIGH (ref 0.0–100.0)

## 2019-03-25 LAB — FERRITIN: Ferritin: 209 ng/mL (ref 24–336)

## 2019-03-25 NOTE — Progress Notes (Signed)
ADVANCED HF CLINIC CONSULT NOTE  Date: 03/25/2019   ID: Lawrence Bonilla, DOB [birth year], MRN [id number]  PCP:  Doree Albee, MD  Cardiologist:  Rozann Lesches, MD Electrophysiologist:  None   Chief Complaint  Patient presents with  . Congestive Heart Failure  . New Patient (Initial Visit)    History of Present Illness:  Lawrence Bonilla is a 68 y.o. male referred by Dr. Domenic Polite for further evaluation of biventricular HF.   He has a h/o severe HTN, HL, DM2, lymphedema, PE, CKD 3-4 (creatinine 2.1-2.6). morbid obesity s/p gastric bypass surgery and systolic HF with EF [id number].  He is a former [employer] Social [employer] in [place], [place]. Moved to [place] in April 2020 due to problems getting up and down stairs so moved close to his daughter and son-in-law in [place]. Now living in their home with them and his wife.    Has h/o gastric bypass surgery about 10-15 years ago by his estimate. Snored severely prior to that and had severe HTN.   Says his heart problems go back about 15 years when he had transient loss of vision. Diagnosed with AF at that time. Does not remember what EF was at that time. Over last 5 years was admitted in [place] with HF.   He was hospitalized at [facility] in September 202 with worsening shortness of breath, shoulder and chest discomfort.  He was felt to have acute on chronic systolic heart failure with minor nondiagnostic high-sensitivity troponin I elevations in flat pattern, and was ultimately diagnosed with pulmonary embolus based on high probability VQ scan despite being on Coumadin.  He was transitioned from Coumadin ultimately to Eliquis. Discharged with home O2.   Echocardiogram in 8/20 demonstrated LVEF 25 to 30%, also mild RV dysfunction, severe biatrial enlargement, and moderate to severe pulmonary hypertension.  He was switched from ACE inhibitor to [facility] in the setting of persistent renal insufficiency and he has tolerated this change so far.   Creatinine has fluctuated from a peak of 2.63 on September 10 down to 2.14 on September 28.  This was on both Lasix and Entresto.  He does not remember them telling him any specifics about his heart in [place]. "I think there was a fair amount of Cardiology going on". He does not remember the name of his cardiologist. Says PCP was managing AF. Denies any h/o having a heart cath. He said he gave a CD of all his records to Dr. Anastasio Champion.   Says he is very good about taking his medications. " A cornucopia of pills every morning and evening". Able to do ADLs. Gets SOB with mild activity. Has finished HHPT.  Has lymphedema and wears support hose. No orthopnea or PND. No bleeding with apixaban.     Past Medical History:  Diagnosis Date  . Atrial fibrillation ([place])   . Congestive heart failure (CHF) ([place])   . DM (diabetes mellitus), type 2 ([place])   . DM type 2 (diabetes mellitus, type 2) ([place]) 02/06/2019  . Essential hypertension   . Hyperlipidemia   . Hypothyroidism   . Hypothyroidism, adult 02/06/2019  . Lymphedema   . PVD (peripheral vascular disease) ([place])   . Vitamin D deficiency disease 02/06/2019    Past Surgical History:  Procedure Laterality Date  . APPENDECTOMY    . BARIATRIC SURGERY      Current Outpatient Medications  Medication Sig Dispense Refill  . amLODipine (NORVASC) 5 MG tablet Take 1 tablet (5 mg  total) by mouth daily. 30 tablet 3  . apixaban (ELIQUIS) 5 MG TABS tablet Take 1 tablet (5 mg total) by mouth 2 (two) times daily. 60 tablet 3  . atorvastatin (LIPITOR) 40 MG tablet Take 40 mg by mouth daily.    . calcitRIOL (ROCALTROL) 0.25 MCG capsule Take 0.25 mcg by mouth. 3 x week - Monday, Wednesday, Friday    . CALCIUM PO Take by mouth daily.    . Ferrous Sulfate (IRON PO) Take by mouth daily.    . furosemide (LASIX) 40 MG tablet Take 2 tablets (80 mg total) by mouth 2 (two) times daily. 120 tablet 3  . isosorbide mononitrate (IMDUR) 30 MG 24 hr tablet Take 1 tablet (30  mg total) by mouth daily. 90 tablet 0  . metoprolol succinate (TOPROL-XL) 100 MG 24 hr tablet Take 1 tablet (100 mg total) by mouth 2 (two) times daily. Take with or immediately following a meal. 60 tablet 3  . potassium chloride (MICRO-K) 10 MEQ CR capsule Take 10 mEq by mouth daily.     . sacubitril-valsartan (ENTRESTO) 24-26 MG Take 1 tablet by mouth 2 (two) times daily. 60 tablet 6  . thyroid (NP THYROID) 60 MG tablet Take 1 tablet (60 mg total) by mouth daily before breakfast. 30 tablet 3   No current facility-administered medications for this encounter.    Allergies:  Patient has no known allergies.   Social History: The patient  reports that he has never smoked. He has never used smokeless tobacco. He reports current alcohol use. He reports that he does not use drugs.   Family History: The patient's family history includes Asthma in his mother.   ROS:  Please see the history of present illness. Otherwise, complete review of systems is positive for fatigue, dyspnea on exertion.  All other systems are reviewed and negative.   Physical Exam: VS:  BP 110/78   Pulse 64   Wt 88.2 kg (194 lb 6.4 oz)   SpO2 94%   BMI 26.37 kg/m , BMI Body mass index is 26.37 kg/m.  Wt Readings from Last 3 Encounters:  03/25/19 88.2 kg (194 lb 6.4 oz)  03/01/19 88 kg (194 lb)  02/06/19 92.2 kg (203 lb 3.2 oz)    General:  Weak chronically-ill appearing. No resp difficulty HEENT: normal Neck: supple. JVP 6-7. Carotids 2+ bilat; no bruits. No lymphadenopathy or thryomegaly appreciated. Cor: PMI nondisplaced. Irregular rate & rhythm. No rubs, gallops or murmurs. Lungs: clear Abdomen: obese soft, nontender, nondistended. No hepatosplenomegaly. No bruits or masses. Good bowel sounds. Extremities: no clubbing, rash, 2+ edema with compression hose. + severe cyanosis Neuro: alert & orientedx3, cranial nerves grossly intact. moves all 4 extremities w/o difficulty. Affect pleasant  ECG:  AF 62 LAFB  non-specific ST depression. Normal volts Personally reviewed   Recent Labwork: 01/28/2019: Magnesium 2.4 02/06/2019: ALT 23; AST 33; TSH 7.18 02/18/2019: B Natriuretic Peptide 3,727.0; BUN 40; Creatinine, Ser 2.14; Hemoglobin 13.6; Platelets 92; Potassium 4.0; Sodium 142   Other Studies Reviewed Today:  Echocardiogram 01/10/2019:  1. The left ventricle has severely reduced systolic function, with an ejection fraction of 25-30%. The cavity size was mildly dilated. Left ventricular diastolic Doppler parameters are indeterminate.  2. The right ventricle has mildly reduced systolic function. The cavity was moderately enlarged. There is no increase in right ventricular wall thickness.  3. Left atrial size was severely dilated.  4. Right atrial size was severely dilated.  5. No evidence of mitral valve stenosis.  6. Tricuspid valve regurgitation is moderate.  7. The aortic valve is tricuspid. No stenosis of the aortic valve.  8. The aorta is normal unless otherwise noted.  9. The aortic root is normal in size and structure. 10. Pulmonary hypertension is moderately elevated, PASP is 63 mmHg.  Assessment and Plan:  1.  Chronic systolic HF with biventricular dysfunction - unclear etiology - echo 8/20 with LVEF 25-30% w/ mildly reduced RV fx and mod-severe PH - Complicated case. He is a relatively poor/eccentric historian so it is hard to know exactly what his cardiac w/u has been to date. I have reached out to Dr. 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 Domenic Polite and we will continue these with the exzception of stopping amlodipine which may be making lymphedema worse - Currently NYHA III and  volume status ok - Will check labs today prior to any further med titrations and also test for other causes of restrictive CM with a myeloma panel and iron studies.   2. Chronic hypoxic respiratory failure - suspect longstanding OSA/OHS in the setting of morbid obesity s/p gastric bypass surgery - recent VQ study raises the question of CTEPH as contributing factor (doubt these would be acute given warfarin therapy)  - continue supplemental O2 - he will need sleep study and PFTs to further evaluate - likely RHC in the near future - Continue Eliquis  3. CKD 3 - likely due to longstanding HTN - recheck today - consider SGLT2i  4.  Permanent atrial fibrillation.   - in setting of severe biatrial enlargement with restrictive CM  - rate controlled - continue Eliquis  5.  Chronic lymphedema. - continue compression hose.  6. Morbid obesity - s/p previous gastric bypass - Body mass index is 26.37 kg/m.  Total time spent 55 minutes. Over half that time spent discussing above.   I have asked him to come back in several weeks and to bring his daughter to discuss next steps.   Glori Bickers, MD  11:14 PM

## 2019-03-25 NOTE — Telephone Encounter (Signed)
Left pt detailed message giving sleep study appt date 11/12 @ [facility]. covid screening @ [facility] 11/9. asked pt to call to confirm appt

## 2019-03-25 NOTE — Patient Instructions (Signed)
Stop Amlodipine  Labs done today, we will contact you for any abnormal results  Your physician has recommended that you have a sleep study. This test records several body functions during sleep, including: brain activity, eye movement, oxygen and carbon dioxide blood levels, heart rate and rhythm, breathing rate and rhythm, the flow of air through your mouth and nose, snoring, body muscle movements, and chest and belly movement.  Your physician recommends that you schedule a follow-up appointment in: 4-6 weeks  If you have any questions or concerns before your next appointment please send [place] a message through [place] or call our office at [phone].  At the [facility], you and your health needs are our priority. As part of our continuing mission to provide you with exceptional heart care, we have created designated Provider Care Teams. These Care Teams include your primary Cardiologist (physician) and Advanced Practice Providers (APPs- Physician Assistants and Nurse Practitioners) who all work together to provide you with the care you need, when you need it.   You may see any of the following providers on your designated Care Team at your next follow up: Marland Kitchen Dr Glori Bickers . Dr Loralie Champagne . Darrick Grinder, NP . Lyda Jester, PA   Please be sure to bring in all your medications bottles to every appointment.

## 2019-03-29 LAB — MULTIPLE MYELOMA PANEL, SERUM
Albumin SerPl Elph-Mcnc: 5.2 g/dL — ABNORMAL HIGH (ref 2.9–4.4)
Albumin/Glob SerPl: 1.2 (ref 0.7–1.7)
Alpha 1: 0.3 g/dL (ref 0.0–0.4)
Alpha2 Glob SerPl Elph-Mcnc: 1.2 g/dL — ABNORMAL HIGH (ref 0.4–1.0)
B-Globulin SerPl Elph-Mcnc: 1.4 g/dL — ABNORMAL HIGH (ref 0.7–1.3)
Gamma Glob SerPl Elph-Mcnc: 1.6 g/dL (ref 0.4–1.8)
Globulin, Total: 4.5 g/dL — ABNORMAL HIGH (ref 2.2–3.9)
IgA: 330 mg/dL (ref 61–437)
IgG (Immunoglobin G), Serum: 1191 mg/dL (ref 603–1613)
IgM (Immunoglobulin M), Srm: 93 mg/dL (ref 20–172)
Total Protein ELP: 9.7 g/dL — ABNORMAL HIGH (ref 6.0–8.5)

## 2019-04-01 ENCOUNTER — Other Ambulatory Visit: Payer: Self-pay

## 2019-04-01 ENCOUNTER — Other Ambulatory Visit (HOSPITAL_COMMUNITY)
Admission: RE | Admit: 2019-04-01 | Discharge: 2019-04-01 | Disposition: A | Discharge status: Home / Self Care | Payer: Medicare HMO | Source: Ambulatory Visit | Attending: Cardiology | Admitting: Cardiology

## 2019-04-02 ENCOUNTER — Other Ambulatory Visit (INDEPENDENT_AMBULATORY_CARE_PROVIDER_SITE_OTHER): Payer: Self-pay | Admitting: Internal Medicine

## 2019-04-02 ENCOUNTER — Other Ambulatory Visit (HOSPITAL_COMMUNITY)
Admission: RE | Admit: 2019-04-02 | Discharge: 2019-04-02 | Disposition: A | Discharge status: Home / Self Care | Payer: Medicare HMO | Source: Ambulatory Visit | Attending: Cardiology | Admitting: Cardiology

## 2019-04-02 ENCOUNTER — Other Ambulatory Visit: Payer: Self-pay

## 2019-04-02 ENCOUNTER — Telehealth (INDEPENDENT_AMBULATORY_CARE_PROVIDER_SITE_OTHER): Payer: Self-pay | Admitting: Internal Medicine

## 2019-04-02 DIAGNOSIS — Z20828 Contact with and (suspected) exposure to other viral communicable diseases: Secondary | ICD-10-CM | POA: Insufficient documentation

## 2019-04-02 DIAGNOSIS — Z01812 Encounter for preprocedural laboratory examination: Secondary | ICD-10-CM | POA: Diagnosis present

## 2019-04-02 LAB — SARS CORONAVIRUS 2 (TAT 6-24 HRS): SARS Coronavirus 2: NEGATIVE

## 2019-04-02 MED ORDER — APIXABAN 5 MG PO TABS: 5.0000 mg | ORAL_TABLET | Freq: Two times a day (BID) | ORAL | 3 refills | Status: DC

## 2019-04-03 ENCOUNTER — Telehealth: Payer: Self-pay | Admitting: Cardiology

## 2019-04-03 NOTE — Telephone Encounter (Signed)
Pt aware that covid test was negative - looks like this was ordered for upcoming sleep study

## 2019-04-03 NOTE — Telephone Encounter (Signed)
New Message     Pt is calling for results of his covid test     Please call

## 2019-04-03 NOTE — Telephone Encounter (Signed)
done

## 2019-04-04 ENCOUNTER — Other Ambulatory Visit: Payer: Self-pay

## 2019-04-04 ENCOUNTER — Ambulatory Visit: Payer: 59 | Attending: Internal Medicine | Admitting: Cardiology

## 2019-04-04 DIAGNOSIS — R0683 Snoring: Secondary | ICD-10-CM

## 2019-04-04 DIAGNOSIS — G4733 Obstructive sleep apnea (adult) (pediatric): Secondary | ICD-10-CM

## 2019-04-08 ENCOUNTER — Telehealth (INDEPENDENT_AMBULATORY_CARE_PROVIDER_SITE_OTHER): Payer: Self-pay | Admitting: Internal Medicine

## 2019-04-08 ENCOUNTER — Other Ambulatory Visit (INDEPENDENT_AMBULATORY_CARE_PROVIDER_SITE_OTHER): Payer: Self-pay | Admitting: Internal Medicine

## 2019-04-08 MED ORDER — THYROID 60 MG PO TABS: 60.0000 mg | ORAL_TABLET | Freq: Every day | ORAL | 3 refills | Status: DC

## 2019-04-08 NOTE — Telephone Encounter (Signed)
Done

## 2019-04-09 ENCOUNTER — Telehealth (INDEPENDENT_AMBULATORY_CARE_PROVIDER_SITE_OTHER): Payer: Self-pay

## 2019-04-09 ENCOUNTER — Other Ambulatory Visit (INDEPENDENT_AMBULATORY_CARE_PROVIDER_SITE_OTHER): Payer: Self-pay | Admitting: Internal Medicine

## 2019-04-09 NOTE — Telephone Encounter (Signed)
I did this yesterday to [employer] [address]

## 2019-04-09 NOTE — Telephone Encounter (Signed)
[facility] called and lvm that pt was d/c from there care. Pt has compelted all services.

## 2019-04-11 ENCOUNTER — Ambulatory Visit (INDEPENDENT_AMBULATORY_CARE_PROVIDER_SITE_OTHER): Payer: 59 | Admitting: Cardiology

## 2019-04-11 ENCOUNTER — Other Ambulatory Visit: Payer: Self-pay

## 2019-04-11 ENCOUNTER — Encounter: Payer: Self-pay | Admitting: Cardiology

## 2019-04-11 VITALS — BP 150/82 | HR 64 | Ht 72.0 in | Wt 199.0 lb

## 2019-04-11 DIAGNOSIS — I5042 Chronic combined systolic (congestive) and diastolic (congestive) heart failure: Secondary | ICD-10-CM | POA: Diagnosis not present

## 2019-04-11 DIAGNOSIS — I4821 Permanent atrial fibrillation: Secondary | ICD-10-CM

## 2019-04-11 DIAGNOSIS — N1832 Chronic kidney disease, stage 3b: Secondary | ICD-10-CM

## 2019-04-11 DIAGNOSIS — I10 Essential (primary) hypertension: Secondary | ICD-10-CM

## 2019-04-11 MED ORDER — HYDRALAZINE HCL 10 MG PO TABS: 10.0000 mg | ORAL_TABLET | Freq: Two times a day (BID) | ORAL | 4 refills | Status: DC

## 2019-04-11 NOTE — Progress Notes (Signed)
Cardiology Office Note  Date: 04/11/2019   ID: Lawrence Bonilla, DOB February 05, 1951, MRN [id number]  PCP:  Doree Albee, MD  Cardiologist:  Rozann Lesches, MD Electrophysiologist:  None   Chief Complaint  Patient presents with  . Cardiac follow-up    History of Present Illness: Lawrence Bonilla is a 68 y.o. male last seen in October, and recently evaluated by Dr. Haroldine Laws in the heart failure clinic in early November. He is in the process of additional testing for further evaluation of his cardiomyopathy and pulmonary hypertension.  Medical regimen has been stable with the exception of discontinuation of amlodipine.  He underwent a sleep study recently, results pending.  I reviewed his follow-up lab work.  Creatinine was up to 2.41, BNP 1838, no M spike.  He states that his weight is usually around 195 pounds at home, on clothes.  He is up a few pounds by our scales.  He reports that his lymphedema is about the same, he feels like it is controlled, he uses compression hose and has been on Lasix 80 mg twice daily.  I did talk to him about using an additional Lasix if his weight goes up 2 pounds in 24 hours.  We reviewed his medications and will plan to start low-dose hydralazine.  He has follow-up in the heart failure clinic on December 4.  Past Medical History:  Diagnosis Date  . Atrial fibrillation ([place])   . Congestive heart failure (CHF) ([place])   . DM type 2 (diabetes mellitus, type 2) ([place])   . Essential hypertension   . Hyperlipidemia   . Hypothyroidism   . Lymphedema   . PVD (peripheral vascular disease) ([place])   . Vitamin D deficiency disease     Past Surgical History:  Procedure Laterality Date  . APPENDECTOMY    . BARIATRIC SURGERY      Current Outpatient Medications  Medication Sig Dispense Refill  . apixaban (ELIQUIS) 5 MG TABS tablet Take 1 tablet (5 mg total) by mouth 2 (two) times daily. 60 tablet 3  . atorvastatin (LIPITOR) 40 MG tablet Take 40 mg by  mouth daily.    . calcitRIOL (ROCALTROL) 0.25 MCG capsule Take 0.25 mcg by mouth. 3 x week - Monday, Wednesday, Friday    . CALCIUM PO Take by mouth daily.    . Ferrous Sulfate (IRON PO) Take by mouth daily.    . furosemide (LASIX) 40 MG tablet Take 2 tablets (80 mg total) by mouth 2 (two) times daily. 120 tablet 3  . isosorbide mononitrate (IMDUR) 30 MG 24 hr tablet Take 1 tablet (30 mg total) by mouth daily. 90 tablet 0  . metoprolol succinate (TOPROL-XL) 100 MG 24 hr tablet Take 1 tablet (100 mg total) by mouth 2 (two) times daily. Take with or immediately following a meal. 60 tablet 3  . potassium chloride (MICRO-K) 10 MEQ CR capsule Take 10 mEq by mouth daily.     . sacubitril-valsartan (ENTRESTO) 24-26 MG Take 1 tablet by mouth 2 (two) times daily. 60 tablet 6  . thyroid (NP THYROID) 60 MG tablet Take 1 tablet (60 mg total) by mouth daily before breakfast. 30 tablet 3  . hydrALAZINE (APRESOLINE) 10 MG tablet Take 1 tablet (10 mg total) by mouth 2 (two) times daily. 60 tablet 4   No current facility-administered medications for this visit.    Allergies:  Patient has no known allergies.   Social History: The patient  reports that he has never  smoked. He has never used smokeless tobacco. He reports current alcohol use. He reports that he does not use drugs.   ROS:  Please see the history of present illness. Otherwise, complete review of systems is positive for none.  All other systems are reviewed and negative.   Physical Exam: VS:  BP (!) 150/82   Pulse 64   Ht 6' (1.829 m)   Wt 199 lb (90.3 kg)   SpO2 93%   BMI 26.99 kg/m , BMI Body mass index is 26.99 kg/m.  Wt Readings from Last 3 Encounters:  04/11/19 199 lb (90.3 kg)  03/25/19 194 lb 6.4 oz (88.2 kg)  03/01/19 194 lb (88 kg)    General: Chronically ill-appearing male, no distress. HEENT: Conjunctiva and lids normal, wearing a mask. Neck: Supple, JVP approximately 10 cm water, no bruits, no thyromegaly. Lungs: Clear to  auscultation, nonlabored breathing at rest. Cardiac: Irregularly irregular, no S3, soft apical systolic murmur, no pericardial rub. Abdomen: Soft, nontender, bowel sounds present. Extremities: Lymphedema with venous stasis and skin discoloration lower legs. Skin: Warm and dry. Musculoskeletal: Kyphosis present. Neuropsychiatric: Alert and oriented x3, affect grossly appropriate.  ECG:  An ECG dated 03/25/2019 was personally reviewed today and demonstrated:  Rate controlled atrial fibrillation with left anterior fascicular block and nonspecific ST-T changes, prolonged QT interval.  Recent Labwork: 01/28/2019: Magnesium 2.4 02/06/2019: TSH 7.18 03/25/2019: ALT 19; AST 26; B Natriuretic Peptide 1,838.8; BUN 42; Creatinine, Ser 2.41; Hemoglobin 12.2; Platelets 124; Potassium 4.3; Sodium 141   Other Studies Reviewed Today:  Echocardiogram 01/10/2019:  1. The left ventricle has severely reduced systolic function, with an ejection fraction of 25-30%. The cavity size was mildly dilated. Left ventricular diastolic Doppler parameters are indeterminate.  2. The right ventricle has mildly reduced systolic function. The cavity was moderately enlarged. There is no increase in right ventricular wall thickness.  3. Left atrial size was severely dilated.  4. Right atrial size was severely dilated.  5. No evidence of mitral valve stenosis.  6. Tricuspid valve regurgitation is moderate.  7. The aortic valve is tricuspid. No stenosis of the aortic valve.  8. The aorta is normal unless otherwise noted.  9. The aortic root is normal in size and structure. 10. Pulmonary hypertension is moderately elevated, PASP is 63 mmHg.  Assessment and Plan:  1.  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  LVEF 25 to 30% with moderate to severe pulmonary hypertension.  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  Appreciate evaluation by Dr. Haroldine Laws.  Continue Toprol-XL, Entresto,  Imdur, and Lasix at 80 mg twice daily with potassium supplement.  Adding hydralazine 10 mg twice daily, can be titrated as tolerated.  2.  Essential hypertension, taken off Norvasc at heart failure visit.  Blood pressure is higher today.  Replacing with hydralazine.  3.  Chronic lymphedema.  Continues to use compression hose and on stable dose of Lasix.  4.  CKD stage IIIb, last creatinine up to 2.41 with normal potassium.  5.  Permanent atrial fibrillation.  Remains on Eliquis for stroke prophylaxis, heart rate controlled on beta-blocker.  6.  Chronic hypoxic respiratory failure.  Patient is morbidly obese with prior history of gastric bypass, underwent recent sleep study with results pending.  Hopefully will be candidate for CPAP.  He has pulmonary hypertension with anticipated right heart cath eventually.  Medication Adjustments/Labs and Tests Ordered: Current medicines are reviewed at length with the patient today.  Concerns regarding medicines are outlined above.  Tests Ordered: No orders of the defined types were placed in this encounter.   Medication Changes: Meds ordered this encounter  Medications  . hydrALAZINE (APRESOLINE) 10 MG tablet    Sig: Take 1 tablet (10 mg total) by mouth 2 (two) times daily.    Dispense:  60 tablet    Refill:  4    04/11/2019 NEW    Disposition:  Follow up 2 months in the [place] office.  Signed, [name], MD, [facility] 04/11/2019 2:14 PM    [facility] [place], [place], [place] [address] Phone: [phone]; Fax: [phone]

## 2019-04-11 NOTE — Patient Instructions (Addendum)
Medication Instructions:    Your physician has recommended you make the following change in your medication:   Start hydralazine 10 mg by mouth twice daily  Continue other medications the same  Labwork:  NONE  Testing/Procedures:  NONE  Follow-Up:  Your physician recommends that you schedule a follow-up appointment in: 2 months.  Any Other Special Instructions Will Be Listed Below (If Applicable).  If you need a refill on your cardiac medications before your next appointment, please call your pharmacy.

## 2019-04-11 NOTE — H&P (View-Only) (Signed)
Cardiology Office Note  Date: 04/11/2019   ID: Lawrence Bonilla, DOB [birth year], MRN [id number]  PCP:  Doree Albee, MD  Cardiologist:  Rozann Lesches, MD Electrophysiologist:  None   Chief Complaint  Patient presents with  . Cardiac follow-up    History of Present Illness: Lawrence Bonilla is a 68 y.o. male last seen in October, and recently evaluated by Dr. Haroldine Laws in the heart failure clinic in early November. He is in the process of additional testing for further evaluation of his cardiomyopathy and pulmonary hypertension.  Medical regimen has been stable with the exception of discontinuation of amlodipine.  He underwent a sleep study recently, results pending.  I reviewed his follow-up lab work.  Creatinine was up to 2.41, BNP 1838, no M spike.  He states that his weight is usually around 195 pounds at home, on clothes.  He is up a few pounds by our scales.  He reports that his lymphedema is about the same, he feels like it is controlled, he uses compression hose and has been on Lasix 80 mg twice daily.  I did talk to him about using an additional Lasix if his weight goes up 2 pounds in 24 hours.  We reviewed his medications and will plan to start low-dose hydralazine.  He has follow-up in the heart failure clinic on December 4.  Past Medical History:  Diagnosis Date  . Atrial fibrillation ([place])   . Congestive heart failure (CHF) ([place])   . DM type 2 (diabetes mellitus, type 2) ([place])   . Essential hypertension   . Hyperlipidemia   . Hypothyroidism   . Lymphedema   . PVD (peripheral vascular disease) ([place])   . Vitamin D deficiency disease     Past Surgical History:  Procedure Laterality Date  . APPENDECTOMY    . BARIATRIC SURGERY      Current Outpatient Medications  Medication Sig Dispense Refill  . apixaban (ELIQUIS) 5 MG TABS tablet Take 1 tablet (5 mg total) by mouth 2 (two) times daily. 60 tablet 3  . atorvastatin (LIPITOR) 40 MG tablet Take 40 mg by  mouth daily.    . calcitRIOL (ROCALTROL) 0.25 MCG capsule Take 0.25 mcg by mouth. 3 x week - Monday, Wednesday, Friday    . CALCIUM PO Take by mouth daily.    . Ferrous Sulfate (IRON PO) Take by mouth daily.    . furosemide (LASIX) 40 MG tablet Take 2 tablets (80 mg total) by mouth 2 (two) times daily. 120 tablet 3  . isosorbide mononitrate (IMDUR) 30 MG 24 hr tablet Take 1 tablet (30 mg total) by mouth daily. 90 tablet 0  . metoprolol succinate (TOPROL-XL) 100 MG 24 hr tablet Take 1 tablet (100 mg total) by mouth 2 (two) times daily. Take with or immediately following a meal. 60 tablet 3  . potassium chloride (MICRO-K) 10 MEQ CR capsule Take 10 mEq by mouth daily.     . sacubitril-valsartan (ENTRESTO) 24-26 MG Take 1 tablet by mouth 2 (two) times daily. 60 tablet 6  . thyroid (NP THYROID) 60 MG tablet Take 1 tablet (60 mg total) by mouth daily before breakfast. 30 tablet 3  . hydrALAZINE (APRESOLINE) 10 MG tablet Take 1 tablet (10 mg total) by mouth 2 (two) times daily. 60 tablet 4   No current facility-administered medications for this visit.    Allergies:  Patient has no known allergies.   Social History: The patient  reports that he has never  smoked. He has never used smokeless tobacco. He reports current alcohol use. He reports that he does not use drugs.   ROS:  Please see the history of present illness. Otherwise, complete review of systems is positive for none.  All other systems are reviewed and negative.   Physical Exam: VS:  BP (!) 150/82   Pulse 64   Ht 6' (1.829 m)   Wt 199 lb (90.3 kg)   SpO2 93%   BMI 26.99 kg/m , BMI Body mass index is 26.99 kg/m.  Wt Readings from Last 3 Encounters:  04/11/19 199 lb (90.3 kg)  03/25/19 194 lb 6.4 oz (88.2 kg)  03/01/19 194 lb (88 kg)    General: Chronically ill-appearing male, no distress. HEENT: Conjunctiva and lids normal, wearing a mask. Neck: Supple, JVP approximately 10 cm water, no bruits, no thyromegaly. Lungs: Clear to  auscultation, nonlabored breathing at rest. Cardiac: Irregularly irregular, no S3, soft apical systolic murmur, no pericardial rub. Abdomen: Soft, nontender, bowel sounds present. Extremities: Lymphedema with venous stasis and skin discoloration lower legs. Skin: Warm and dry. Musculoskeletal: Kyphosis present. Neuropsychiatric: Alert and oriented x3, affect grossly appropriate.  ECG:  An ECG dated 03/25/2019 was personally reviewed today and demonstrated:  Rate controlled atrial fibrillation with left anterior fascicular block and nonspecific ST-T changes, prolonged QT interval.  Recent Labwork: 01/28/2019: Magnesium 2.4 02/06/2019: TSH 7.18 03/25/2019: ALT 19; AST 26; B Natriuretic Peptide 1,838.8; BUN 42; Creatinine, Ser 2.41; Hemoglobin 12.2; Platelets 124; Potassium 4.3; Sodium 141   Other Studies Reviewed Today:  Echocardiogram 01/10/2019:  1. The left ventricle has severely reduced systolic function, with an ejection fraction of 25-30%. The cavity size was mildly dilated. Left ventricular diastolic Doppler parameters are indeterminate.  2. The right ventricle has mildly reduced systolic function. The cavity was moderately enlarged. There is no increase in right ventricular wall thickness.  3. Left atrial size was severely dilated.  4. Right atrial size was severely dilated.  5. No evidence of mitral valve stenosis.  6. Tricuspid valve regurgitation is moderate.  7. The aortic valve is tricuspid. No stenosis of the aortic valve.  8. The aorta is normal unless otherwise noted.  9. The aortic root is normal in size and structure. 10. Pulmonary hypertension is moderately elevated, PASP is 63 mmHg.  Assessment and Plan:  1.  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  LVEF 25 to 30% with moderate to severe pulmonary hypertension.  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  Appreciate evaluation by Dr. Haroldine Laws.  Continue Toprol-XL, Entresto,  Imdur, and Lasix at 80 mg twice daily with potassium supplement.  Adding hydralazine 10 mg twice daily, can be titrated as tolerated.  2.  Essential hypertension, taken off Norvasc at heart failure visit.  Blood pressure is higher today.  Replacing with hydralazine.  3.  Chronic lymphedema.  Continues to use compression hose and on stable dose of Lasix.  4.  CKD stage IIIb, last creatinine up to 2.41 with normal potassium.  5.  Permanent atrial fibrillation.  Remains on Eliquis for stroke prophylaxis, heart rate controlled on beta-blocker.  6.  Chronic hypoxic respiratory failure.  Patient is morbidly obese with prior history of gastric bypass, underwent recent sleep study with results pending.  Hopefully will be candidate for CPAP.  He has pulmonary hypertension with anticipated right heart cath eventually.  Medication Adjustments/Labs and Tests Ordered: Current medicines are reviewed at length with the patient today.  Concerns regarding medicines are outlined above.  Tests Ordered: No orders of the defined types were placed in this encounter.   Medication Changes: Meds ordered this encounter  Medications  . hydrALAZINE (APRESOLINE) 10 MG tablet    Sig: Take 1 tablet (10 mg total) by mouth 2 (two) times daily.    Dispense:  60 tablet    Refill:  4    04/11/2019 NEW    Disposition:  Follow up 2 months in the [place] office.  Signed, [name], MD, [facility] 04/11/2019 2:14 PM    [facility] at [place], [place], [place] [address] Phone: [phone]; Fax: [phone]

## 2019-04-14 NOTE — Procedures (Signed)
   Patient Name: Lawrence Bonilla, Lawrence Bonilla Date:04/04/2019 Gender: Male D.O.B: [birth year] Age (years): 68 Referring Provider: Shaune Pascal Bensimhon Height (inches): 72 Interpreting Physician: Fransico Him MD, ABSM Weight (lbs): 194 RPSGT: Peak, Robert BMI: 26 MRN: [id number] Neck Size: 14.00  CLINICAL INFORMATION Sleep Study Type: NPSG  Indication for sleep study: Snoring  Epworth Sleepiness Score: 7  SLEEP STUDY TECHNIQUE As per the AASM Manual for the Scoring of Sleep and Associated Events v2.3 (April 2016) with a hypopnea requiring 4% desaturations.  The channels recorded and monitored were frontal, central and occipital EEG, electrooculogram (EOG), submentalis EMG (chin), nasal and oral airflow, thoracic and abdominal wall motion, anterior tibialis EMG, snore microphone, electrocardiogram, and pulse oximetry.  MEDICATIONS Medications self-administered by patient taken the night of the study : N/A  SLEEP ARCHITECTURE The study was initiated at 10:04:48 PM and ended at 4:31:46 AM.  Sleep onset time was 60.0 minutes and the sleep efficiency was 36.4%. The total sleep time was 141 minutes.  Stage REM latency was 109.5 minutes.  The patient spent 51.1% of the night in stage N1 sleep, 48.6% in stage N2 sleep, 0.0% in stage N3 and 0.4% in REM.  Alpha intrusion was absent.  Supine sleep was 100.00%.  RESPIRATORY PARAMETERS The overall apnea/hypopnea index (AHI) was 56.2 per hour. There were 44 total apneas, including 7 obstructive, 36 central and 1 mixed apneas. There were 88 hypopneas and 15 RERAs.  The AHI during Stage REM sleep was 0.0 per hour.  AHI while supine was 56.2 per hour.  The mean oxygen saturation was 88.1%. The minimum SpO2 during sleep was 78.0%.  soft snoring was noted during this study.  CARDIAC DATA The 2 lead EKG demonstrated sinus rhythm. The mean heart rate was 65.5 beats per minute. Other EKG findings include: PVCs.  LEG MOVEMENT DATA The total PLMS  were 0 with a resulting PLMS index of 0.0. Associated arousal with leg movement index was 0.0 .  IMPRESSIONS - Severe obstructive sleep apnea occurred during this study (AHI = 56.2/h). - Mild central sleep apnea occurred during this study (CAI = 15.3/h). - Moderate oxygen desaturation was noted during this study (Min O2 = 78.0%). - The patient snored with soft snoring volume. - EKG findings include PVCs. - Clinically significant periodic limb movements did not occur during sleep. No significant associated arousals.  DIAGNOSIS - Obstructive Sleep Apnea (327.23 [G47.33 ICD-10]) - Central Sleep Apnea (327.27 [G47.37 ICD-10]) - Nocturnal Hypoxemia (327.26 [G47.36 ICD-10])  RECOMMENDATIONS - CPAP titration to determine optimal pressure required to alleviate sleep disordered breathing. BiPAP or ASV titration may be required to eliminate central sleep apnea. - Positional therapy avoiding supine position during sleep. - Avoid alcohol, sedatives and other CNS depressants that may worsen sleep apnea and disrupt normal sleep architecture. - Sleep hygiene should be reviewed to assess factors that may improve sleep quality. - Weight management and regular exercise should be initiated or continued if appropriate.  [Electronically signed] 04/14/2019 09:26 PM  Fransico Him MD, ABSM Diplomate, American Board of Sleep Medicine

## 2019-04-16 ENCOUNTER — Other Ambulatory Visit (INDEPENDENT_AMBULATORY_CARE_PROVIDER_SITE_OTHER): Payer: Self-pay | Admitting: Internal Medicine

## 2019-04-16 ENCOUNTER — Telehealth: Payer: Self-pay | Admitting: *Deleted

## 2019-04-16 DIAGNOSIS — R0683 Snoring: Secondary | ICD-10-CM

## 2019-04-16 DIAGNOSIS — I10 Essential (primary) hypertension: Secondary | ICD-10-CM

## 2019-04-16 NOTE — Telephone Encounter (Signed)
Informed patient of sleep study results and patient understanding was verbalized. Patient understands his sleep study showed they have sleep apnea and recommend CPAP titration. Please set up titration in the sleep lab.  cpap titration sent to sleep pool.

## 2019-04-16 NOTE — Telephone Encounter (Signed)
-----   Message from Lawrence Margarita, MD sent at 04/14/2019  9:29 PM EST ----- Please let patient know that they have sleep apnea and recommend CPAP titration. Please set up titration in the sleep lab.

## 2019-04-22 ENCOUNTER — Telehealth (INDEPENDENT_AMBULATORY_CARE_PROVIDER_SITE_OTHER): Payer: Self-pay

## 2019-04-22 ENCOUNTER — Other Ambulatory Visit (INDEPENDENT_AMBULATORY_CARE_PROVIDER_SITE_OTHER): Payer: Self-pay | Admitting: Internal Medicine

## 2019-04-22 MED ORDER — ATORVASTATIN CALCIUM 40 MG PO TABS: 40.0000 mg | ORAL_TABLET | Freq: Every day | ORAL | 0 refills | Status: DC

## 2019-04-22 NOTE — Telephone Encounter (Signed)
The patient should be taking NP thyroid 60 mg tablets, take 1 daily.  Make sure he looks at the medication he has, make sure it is 60 mg tablet.  If not, I can send a new prescription.

## 2019-04-22 NOTE — Telephone Encounter (Signed)
Pt was called and left a voice message to home phone contact. Pt was explained of the refills given; and  The instructions for the NP thyroid 60 mg to take 1 tablet a day. If he needed anything to call office back.

## 2019-04-25 ENCOUNTER — Telehealth: Payer: Self-pay | Admitting: Cardiology

## 2019-04-25 NOTE — Telephone Encounter (Signed)
New Message    Pt is returning a call for [name]    Please call back

## 2019-04-26 ENCOUNTER — Encounter (HOSPITAL_COMMUNITY): Payer: Self-pay | Admitting: Internal Medicine

## 2019-04-26 ENCOUNTER — Ambulatory Visit (HOSPITAL_COMMUNITY)
Admission: RE | Admit: 2019-04-26 | Discharge: 2019-04-26 | Disposition: A | Discharge status: Home / Self Care | Payer: 59 | Source: Ambulatory Visit | Attending: Internal Medicine | Admitting: Internal Medicine

## 2019-04-26 ENCOUNTER — Telehealth: Payer: Self-pay | Admitting: *Deleted

## 2019-04-26 ENCOUNTER — Other Ambulatory Visit: Payer: Self-pay

## 2019-04-26 VITALS — BP 110/76 | HR 63 | Wt 209.8 lb

## 2019-04-26 DIAGNOSIS — N1832 Chronic kidney disease, stage 3b: Secondary | ICD-10-CM

## 2019-04-26 DIAGNOSIS — I071 Rheumatic tricuspid insufficiency: Secondary | ICD-10-CM | POA: Diagnosis not present

## 2019-04-26 DIAGNOSIS — I130 Hypertensive heart and chronic kidney disease with heart failure and stage 1 through stage 4 chronic kidney disease, or unspecified chronic kidney disease: Secondary | ICD-10-CM | POA: Diagnosis not present

## 2019-04-26 DIAGNOSIS — I5022 Chronic systolic (congestive) heart failure: Secondary | ICD-10-CM | POA: Insufficient documentation

## 2019-04-26 DIAGNOSIS — Z7901 Long term (current) use of anticoagulants: Secondary | ICD-10-CM | POA: Diagnosis not present

## 2019-04-26 DIAGNOSIS — I890 Lymphedema, not elsewhere classified: Secondary | ICD-10-CM | POA: Insufficient documentation

## 2019-04-26 DIAGNOSIS — Z79899 Other long term (current) drug therapy: Secondary | ICD-10-CM | POA: Diagnosis not present

## 2019-04-26 DIAGNOSIS — N184 Chronic kidney disease, stage 4 (severe): Secondary | ICD-10-CM | POA: Insufficient documentation

## 2019-04-26 DIAGNOSIS — J9611 Chronic respiratory failure with hypoxia: Secondary | ICD-10-CM | POA: Insufficient documentation

## 2019-04-26 DIAGNOSIS — I4821 Permanent atrial fibrillation: Secondary | ICD-10-CM | POA: Insufficient documentation

## 2019-04-26 DIAGNOSIS — G4733 Obstructive sleep apnea (adult) (pediatric): Secondary | ICD-10-CM | POA: Insufficient documentation

## 2019-04-26 DIAGNOSIS — E785 Hyperlipidemia, unspecified: Secondary | ICD-10-CM | POA: Diagnosis not present

## 2019-04-26 DIAGNOSIS — Z6828 Body mass index (BMI) 28.0-28.9, adult: Secondary | ICD-10-CM | POA: Diagnosis not present

## 2019-04-26 DIAGNOSIS — Z9884 Bariatric surgery status: Secondary | ICD-10-CM | POA: Insufficient documentation

## 2019-04-26 DIAGNOSIS — E039 Hypothyroidism, unspecified: Secondary | ICD-10-CM | POA: Diagnosis not present

## 2019-04-26 DIAGNOSIS — E1122 Type 2 diabetes mellitus with diabetic chronic kidney disease: Secondary | ICD-10-CM | POA: Diagnosis not present

## 2019-04-26 LAB — BASIC METABOLIC PANEL
Anion gap: 10 (ref 5–15)
BUN: 41 mg/dL — ABNORMAL HIGH (ref 8–23)
CO2: 22 mmol/L (ref 22–32)
Calcium: 9 mg/dL (ref 8.9–10.3)
Chloride: 110 mmol/L (ref 98–111)
Creatinine, Ser: 2.2 mg/dL — ABNORMAL HIGH (ref 0.61–1.24)
GFR calc Af Amer: 34 mL/min — ABNORMAL LOW (ref >60)
GFR calc non Af Amer: 30 mL/min — ABNORMAL LOW (ref >60)
Glucose, Bld: 113 mg/dL — ABNORMAL HIGH (ref 70–99)
Potassium: 4.3 mmol/L (ref 3.5–5.1)
Sodium: 142 mmol/L (ref 135–145)

## 2019-04-26 LAB — PROTIME-INR
INR: 1.6 — ABNORMAL HIGH (ref 0.8–1.2)
Prothrombin Time: 18.9 seconds — ABNORMAL HIGH (ref 11.4–15.2)

## 2019-04-26 LAB — CBC
HCT: 38.5 % — ABNORMAL LOW (ref 39.0–52.0)
Hemoglobin: 12.3 g/dL — ABNORMAL LOW (ref 13.0–17.0)
MCH: 35.7 pg — ABNORMAL HIGH (ref 26.0–34.0)
MCHC: 31.9 g/dL (ref 30.0–36.0)
MCV: 111.6 fL — ABNORMAL HIGH (ref 80.0–100.0)
Platelets: 91 10*3/uL — ABNORMAL LOW (ref 150–400)
RBC: 3.45 MIL/uL — ABNORMAL LOW (ref 4.22–5.81)
RDW: 15.2 % (ref 11.5–15.5)
WBC: 5 10*3/uL (ref 4.0–10.5)
nRBC: 0 % (ref 0.0–0.2)

## 2019-04-26 LAB — BRAIN NATRIURETIC PEPTIDE: B Natriuretic Peptide: >4500 pg/mL — ABNORMAL HIGH (ref 0.0–100.0)

## 2019-04-26 NOTE — Patient Instructions (Addendum)
Labs today We will only contact you if something comes back abnormal or we need to make some changes. Otherwise no news is good news!  Your physician has recommended that you have a pulmonary function test. Pulmonary Function Tests are a group of tests that measure how well air moves in and out of your lungs. -for this procedure you will need a pre screening COVD test to be done 3 days prior [facility]  9am-3pm Central recommends that you schedule a follow-up appointment in: 3 months with Dr Haroldine Laws     You are scheduled for a Cardiac Catheterization on Thursday, December 10 with Dr. Glori Bickers.  1. Please arrive at the [facility] (Main Entrance A) at [facility]: [address] [place], [place] [address] at 9:00 AM (This time is two hours before your procedure to ensure your preparation). Free valet parking service is available.   Special note: Every effort is made to have your procedure done on time. Please understand that emergencies sometimes delay scheduled procedures.  2. Diet: Do not eat solid foods after midnight.  The patient may have clear liquids until 5am upon the day of the procedure.  3. Labs: You will need to have pre procedure COVIDtesting done on 04/29/19 any time between 9am-3pm (drive thru) [facility] [place], [place] [address]  -once you test you must remain at home in quarantine until you return for your procedure   4. Medication instructions in preparation for your procedure:   Contrast Allergy: No    Stop taking Eliquis (Apixiban) on Tuesday, December 8.  Hold Lasix and Entresto the day of your procedure, Thursday December 10    On the morning of your procedure, take your Aspirin and any morning medicines NOT listed above.  You may use sips of water.  5. Plan for one night stay--bring personal belongings. 6. Bring a current list of your medications and current insurance  cards. 7. You MUST have a responsible person to drive you home. 8. Someone MUST be with you the first 24 hours after you arrive home or your discharge will be delayed. 9. Please wear clothes that are easy to get on and off and wear slip-on shoes.  Thank you for allowing [place] to care for you!   -- [facility]

## 2019-04-26 NOTE — Telephone Encounter (Signed)
PA for CPAP titration submitted to [employer] via web portal.

## 2019-04-26 NOTE — Telephone Encounter (Signed)
Informed patient of sleep study results and patient understanding was verbalized. Patient understands his sleep study showed they have sleep apnea and recommend CPAP titration.  Pt is aware and agreeable to his results.

## 2019-04-26 NOTE — Progress Notes (Signed)
ADVANCED HF CLINIC NOTE  Date: 04/26/2019   ID: Lawrence Bonilla, DOB 03-17-1951, MRN [id number]  PCP:  Doree Albee, MD  Cardiologist:  Rozann Lesches, MD Electrophysiologist:  None   Chief Complaint  Patient presents with   Follow-up    History of Present Illness:  Lawrence Bonilla is a 68 y.o. male referred by Dr. Domenic Polite for further evaluation of biventricular HF.   He has a h/o severe HTN, HL, DM2, lymphedema, PE, CKD 3-4 (creatinine 2.1-2.6). morbid obesity s/p gastric bypass surgery and systolic HF with EF [id number].  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 Moved to [place] in April 2020 due to problems getting up and down stairs so moved close to his daughter and son-in-law in [place]. Now living in their home with them and his wife.    Has h/o gastric bypass surgery about 10-15 years ago by his estimate. Snored severely prior to that and had severe HTN.   Says his heart problems go back about 15 years when he had transient loss of vision. Diagnosed with AF at that time. Does not remember what EF was at that time. Over last 5 years was admitted in [place] with HF.   He was hospitalized at [facility] in September 2019 with worsening shortness of breath, shoulder and chest discomfort.  He was felt to have acute on chronic systolic heart failure with minor nondiagnostic high-sensitivity troponin I elevations in flat pattern, and was ultimately diagnosed with pulmonary embolus based on high probability VQ scan despite being on Coumadin.  He was transitioned from Coumadin ultimately to Eliquis. Discharged with home O2.   Echocardiogram in 8/20 demonstrated LVEF 25 to 30%, also mild RV dysfunction, severe biatrial enlargement, and moderate to severe pulmonary hypertension.  He was switched from ACE inhibitor to [facility] in the setting of persistent renal insufficiency and he has tolerated this change so far.  Creatinine has fluctuated from a peak of 2.63 on  September 10 down to 2.14 on September 28.  This was on both Lasix and Entresto.  He does not remember them telling him any specifics about his heart in [place]. "I think there was a fair amount of Cardiology going on". He does not remember the name of his cardiologist. Says PCP was managing AF. Denies any h/o having a heart cath.  I saw him for the first time on 03/25/2019. I felt he had restrictive CM. Volume status was improved. NYHA III-IIIB. We stopped amlodipine. Saw Dr. Domenic Polite recently and was stable. BP up so hydralazine added. Had sleep study 11/20 and had severe OSA with AHI 56% (27% CSA).  Says he is quite fatigued and withdrawn. Can get around the house without too much difficulty but if he has to walk to the car he is wiped out. Still struggling with LE edema/lymphedema. No CP. No bleeding on Eliquis. Says he is wearing O2 at home but sats still drop into 70s..    Past Medical History:  Diagnosis Date   Atrial fibrillation ([place])    Congestive heart failure (CHF) (HCC)    DM type 2 (diabetes mellitus, type 2) ([place])    Essential hypertension    Hyperlipidemia    Hypothyroidism    Lymphedema    PVD (peripheral vascular disease) (HCC)    Vitamin D deficiency disease     Past Surgical History:  Procedure Laterality Date   APPENDECTOMY     BARIATRIC SURGERY  Current Outpatient Medications  Medication Sig Dispense Refill   apixaban (ELIQUIS) 5 MG TABS tablet Take 1 tablet (5 mg total) by mouth 2 (two) times daily. 60 tablet 3   atorvastatin (LIPITOR) 40 MG tablet Take 1 tablet (40 mg total) by mouth daily. 90 tablet 0   calcitRIOL (ROCALTROL) 0.25 MCG capsule Take 0.25 mcg by mouth. 3 x week - Monday, Wednesday, Friday     CALCIUM PO Take by mouth daily.     Ferrous Sulfate (IRON PO) Take by mouth daily.     furosemide (LASIX) 40 MG tablet Take 2 tablets (80 mg total) by mouth 2 (two) times daily. 120 tablet 3   hydrALAZINE (APRESOLINE) 10 MG tablet  Take 1 tablet (10 mg total) by mouth 2 (two) times daily. 60 tablet 4   isosorbide mononitrate (IMDUR) 30 MG 24 hr tablet Take 1 tablet (30 mg total) by mouth daily. 90 tablet 0   metoprolol succinate (TOPROL-XL) 100 MG 24 hr tablet Take 1 tablet (100 mg total) by mouth 2 (two) times daily. Take with or immediately following a meal. 60 tablet 3   potassium chloride (MICRO-K) 10 MEQ CR capsule Take 10 mEq by mouth daily.      sacubitril-valsartan (ENTRESTO) 24-26 MG Take 1 tablet by mouth 2 (two) times daily. 60 tablet 6   thyroid (NP THYROID) 60 MG tablet Take 1 tablet (60 mg total) by mouth daily before breakfast. 30 tablet 3   No current facility-administered medications for this encounter.    Allergies:  Patient has no known allergies.   Social History: The patient  reports that he has never smoked. He has never used smokeless tobacco. He reports current alcohol use. He reports that he does not use drugs.   Family History: The patient's family history includes Asthma in his mother.   ROS:  Please see the history of present illness. Otherwise, complete review of systems is positive for fatigue, dyspnea on exertion.  All other systems are reviewed and negative.   Physical Exam: VS:  BP 110/76    Pulse 63    Wt 95.2 kg (209 lb 12.8 oz)    SpO2 95%    BMI 28.45 kg/m , BMI Body mass index is 28.45 kg/m.  Wt Readings from Last 3 Encounters:  04/26/19 95.2 kg (209 lb 12.8 oz)  04/11/19 90.3 kg (199 lb)  03/25/19 88.2 kg (194 lb 6.4 oz)    General:  Weak chronically-ill appearing. No resp difficulty HEENT: normal Neck: supple. no JVD. Carotids 2+ bilat; no bruits. No lymphadenopathy or thryomegaly appreciated. Cor: PMI nondisplaced. Irregular rate & rhythm. No rubs, gallops or murmurs. Lungs: clear Abdomen: obese soft, nontender, nondistended. No hepatosplenomegaly. No bruits or masses. Good bowel sounds. Extremities: no  clubbing, rash. Fingernails cyanotic Chronic  lymphedema Neuro: alert & orientedx3, cranial nerves grossly intact. moves all 4 extremities w/o difficulty. Affect pleasant   Recent Labwork: 01/28/2019: Magnesium 2.4 02/06/2019: TSH 7.18 03/25/2019: ALT 19; AST 26; B Natriuretic Peptide 1,838.8; BUN 42; Creatinine, Ser 2.41; Hemoglobin 12.2; Platelets 124; Potassium 4.3; Sodium 141   Other Studies Reviewed Today:  Echocardiogram 01/10/2019:  1. The left ventricle has severely reduced systolic function, with an ejection fraction of 25-30%. The cavity size was mildly dilated. Left ventricular diastolic Doppler parameters are indeterminate.  2. The right ventricle has mildly reduced systolic function. The cavity was moderately enlarged. There is no increase in right ventricular wall thickness.  3. Left atrial size was severely dilated.  4. Right atrial size was severely dilated.  5. No evidence of mitral valve stenosis.  6. Tricuspid valve regurgitation is moderate.  7. The aortic valve is tricuspid. No stenosis of the aortic valve.  8. The aorta is normal unless otherwise noted.  9. The aortic root is normal in size and structure. 10. Pulmonary hypertension is moderately elevated, PASP is 63 mmHg.  Assessment and Plan:  1.  Chronic systolic HF with biventricular dysfunction - unclear etiology - echo 8/20 with LVEF 25-30% w/ mildly reduced RV fx and mod-severe PH - Complicated case. He is a relatively poor/eccentric historian so it is hard to know exactly what his cardiac w/u has been to date. - In looking at his echo, I suspect he has a restrictive CM due to longstanding uncontrolled HTN and probable OSA/OHS - He denies any h/o cardiac cath and is currently not a candidate for coronary angiography (or cMRI) with CKD; through [place] may be helpful - SPEP ws normal.  - He is mproved with recent adjustments by Dr. Domenic Polite but I remain concern about how advanced his HF is. I asked that his daughter join [place] today to discuss but she was unable to  attend due to scheduling issues.  - Currently NYHA IIIB and volume status ok - Continue lasix 80 bid - Continue hydralazine 10 tid and Imdur 30 daily - Continue Toprol 100 daily - Remains on Entresto 24/26 despite CKD 4. Wil continue but will need to follow renal function closely. Labs today - Not candidate for SGLT2i with CKD4 - Will schedule RHC to further assess. Unable to complete CPX due to debility.  - PYP in near future. Myeloma panel negatve. - I suspect we are headed toward goals of care discussions. Will await results of RHC   2. 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  - he will need PFTs to further evaluate. Plan RHC as above- will check ABG at time o RHC. Will need f/u with Pulmonary.  - PSG on 04/04/19 AHI 56 with 27% CSA. Has f/u with Dr. Radford Pax - Continue Eliquis. No bleeding   3. CKD 4 - likely due to longstanding HTN - recent creatinine 2.4  - Repeat today  4.  Permanent atrial fibrillation.   - in setting of severe biatrial enlargement with restrictive CM  - rate controlled - continue Eliquis  5.  Chronic lymphedema. - continue compression hose.  6. Morbid obesity - s/p previous gastric bypass - Body mass index is 28.45 kg/m.   7. Severe OSA/CSA - very complex SA. Likely related to obesity and advanced HF - following with Dr. Radford Pax for CPAP titration  Total time spent 45 minutes. Over half that time spent discussing above.    Glori Bickers, MD  11:54 AM

## 2019-04-29 ENCOUNTER — Other Ambulatory Visit (HOSPITAL_COMMUNITY)
Admission: RE | Admit: 2019-04-29 | Discharge: 2019-04-29 | Disposition: A | Discharge status: Home / Self Care | Payer: 59 | Source: Ambulatory Visit | Attending: Internal Medicine | Admitting: Internal Medicine

## 2019-04-29 ENCOUNTER — Telehealth: Payer: Self-pay | Admitting: *Deleted

## 2019-04-29 DIAGNOSIS — Z20828 Contact with and (suspected) exposure to other viral communicable diseases: Secondary | ICD-10-CM | POA: Diagnosis not present

## 2019-04-29 DIAGNOSIS — Z01812 Encounter for preprocedural laboratory examination: Secondary | ICD-10-CM | POA: Diagnosis present

## 2019-04-29 NOTE — Telephone Encounter (Signed)
Follow Up  Patient is calling in with questions about sleep study and getting a sleep study appointment scheduled.

## 2019-04-29 NOTE — Telephone Encounter (Signed)
Staff message sent to [facility]. Lawrence Bonilla received. Ok to schedule CPAP titration. Auth # [id number]. Valid dates 04/29/19 to 10/26/19.

## 2019-04-30 ENCOUNTER — Other Ambulatory Visit (HOSPITAL_BASED_OUTPATIENT_CLINIC_OR_DEPARTMENT_OTHER): Payer: Self-pay

## 2019-04-30 LAB — NOVEL CORONAVIRUS, NAA (HOSP ORDER, SEND-OUT TO REF LAB; TAT 18-24 HRS): SARS-CoV-2, NAA: NOT DETECTED

## 2019-04-30 NOTE — Telephone Encounter (Addendum)
Spoke to patient to inform him that his titration has been sent to the sleep pool for prior authorizton. Pt is agreeable to treatment.

## 2019-05-02 ENCOUNTER — Ambulatory Visit (HOSPITAL_COMMUNITY)
Admission: RE | Admit: 2019-05-02 | Discharge: 2019-05-02 | Disposition: A | Discharge status: Home / Self Care | Payer: Medicare HMO | Attending: Internal Medicine | Admitting: Internal Medicine

## 2019-05-02 ENCOUNTER — Other Ambulatory Visit: Payer: Self-pay

## 2019-05-02 ENCOUNTER — Encounter (HOSPITAL_COMMUNITY)
Admission: RE | Disposition: A | Discharge status: Home / Self Care | Payer: Self-pay | Source: Home / Self Care | Attending: Internal Medicine

## 2019-05-02 DIAGNOSIS — Z79890 Hormone replacement therapy (postmenopausal): Secondary | ICD-10-CM | POA: Insufficient documentation

## 2019-05-02 DIAGNOSIS — N1832 Chronic kidney disease, stage 3b: Secondary | ICD-10-CM | POA: Diagnosis not present

## 2019-05-02 DIAGNOSIS — E785 Hyperlipidemia, unspecified: Secondary | ICD-10-CM | POA: Insufficient documentation

## 2019-05-02 DIAGNOSIS — I890 Lymphedema, not elsewhere classified: Secondary | ICD-10-CM | POA: Diagnosis not present

## 2019-05-02 DIAGNOSIS — I4821 Permanent atrial fibrillation: Secondary | ICD-10-CM | POA: Insufficient documentation

## 2019-05-02 DIAGNOSIS — Z79899 Other long term (current) drug therapy: Secondary | ICD-10-CM | POA: Insufficient documentation

## 2019-05-02 DIAGNOSIS — J9611 Chronic respiratory failure with hypoxia: Secondary | ICD-10-CM | POA: Diagnosis not present

## 2019-05-02 DIAGNOSIS — E1122 Type 2 diabetes mellitus with diabetic chronic kidney disease: Secondary | ICD-10-CM | POA: Insufficient documentation

## 2019-05-02 DIAGNOSIS — I130 Hypertensive heart and chronic kidney disease with heart failure and stage 1 through stage 4 chronic kidney disease, or unspecified chronic kidney disease: Secondary | ICD-10-CM | POA: Diagnosis present

## 2019-05-02 DIAGNOSIS — Z9884 Bariatric surgery status: Secondary | ICD-10-CM | POA: Insufficient documentation

## 2019-05-02 DIAGNOSIS — E1151 Type 2 diabetes mellitus with diabetic peripheral angiopathy without gangrene: Secondary | ICD-10-CM | POA: Insufficient documentation

## 2019-05-02 DIAGNOSIS — I5022 Chronic systolic (congestive) heart failure: Secondary | ICD-10-CM | POA: Insufficient documentation

## 2019-05-02 DIAGNOSIS — Z7901 Long term (current) use of anticoagulants: Secondary | ICD-10-CM | POA: Diagnosis not present

## 2019-05-02 DIAGNOSIS — I272 Pulmonary hypertension, unspecified: Secondary | ICD-10-CM | POA: Diagnosis not present

## 2019-05-02 DIAGNOSIS — E039 Hypothyroidism, unspecified: Secondary | ICD-10-CM | POA: Diagnosis not present

## 2019-05-02 HISTORY — PX: RIGHT HEART CATH: CATH118263

## 2019-05-02 LAB — POCT I-STAT EG7
Acid-base deficit: 1 mmol/L (ref 0.0–2.0)
Acid-base deficit: 1 mmol/L (ref 0.0–2.0)
Bicarbonate: 24.6 mmol/L (ref 20.0–28.0)
Bicarbonate: 25.2 mmol/L (ref 20.0–28.0)
Calcium, Ion: 1.24 mmol/L (ref 1.15–1.40)
Calcium, Ion: 1.24 mmol/L (ref 1.15–1.40)
HCT: 35 % — ABNORMAL LOW (ref 39.0–52.0)
HCT: 36 % — ABNORMAL LOW (ref 39.0–52.0)
Hemoglobin: 11.9 g/dL — ABNORMAL LOW (ref 13.0–17.0)
Hemoglobin: 12.2 g/dL — ABNORMAL LOW (ref 13.0–17.0)
O2 Saturation: 55 %
O2 Saturation: 57 %
Potassium: 4 mmol/L (ref 3.5–5.1)
Potassium: 4 mmol/L (ref 3.5–5.1)
Sodium: 143 mmol/L (ref 135–145)
Sodium: 143 mmol/L (ref 135–145)
TCO2: 26 mmol/L (ref 22–32)
TCO2: 27 mmol/L (ref 22–32)
pCO2, Ven: 44.4 mmHg (ref 44.0–60.0)
pCO2, Ven: 45.3 mmHg (ref 44.0–60.0)
pH, Ven: 7.352 (ref 7.250–7.430)
pH, Ven: 7.352 (ref 7.250–7.430)
pO2, Ven: 30 mmHg — CL (ref 32.0–45.0)
pO2, Ven: 31 mmHg — CL (ref 32.0–45.0)

## 2019-05-02 LAB — GLUCOSE, CAPILLARY
Glucose-Capillary: 88 mg/dL (ref 70–99)
Glucose-Capillary: 71 mg/dL (ref 70–99)

## 2019-05-02 SURGERY — RIGHT HEART CATH
Anesthesia: LOCAL

## 2019-05-02 MED ORDER — ACETAMINOPHEN 325 MG PO TABS: 650.0000 mg | ORAL_TABLET | ORAL | Status: DC | PRN

## 2019-05-02 MED ORDER — SODIUM CHLORIDE 0.9 % IV SOLN: INTRAVENOUS | Status: DC

## 2019-05-02 MED ORDER — HEPARIN (PORCINE) IN NACL 1000-0.9 UT/500ML-% IV SOLN
INTRAVENOUS | Status: AC
  Filled 2019-05-02: qty 500

## 2019-05-02 MED ORDER — ONDANSETRON HCL 4 MG/2ML IJ SOLN: 4.0000 mg | Freq: Four times a day (QID) | INTRAMUSCULAR | Status: DC | PRN

## 2019-05-02 MED ORDER — SODIUM CHLORIDE 0.9% FLUSH: 3.0000 mL | Freq: Two times a day (BID) | INTRAVENOUS | Status: DC

## 2019-05-02 MED ORDER — HEPARIN (PORCINE) IN NACL 1000-0.9 UT/500ML-% IV SOLN
INTRAVENOUS | Status: DC | PRN
  Administered 2019-05-02: 500 mL

## 2019-05-02 MED ORDER — SODIUM CHLORIDE 0.9% FLUSH: 3.0000 mL | INTRAVENOUS | Status: DC | PRN

## 2019-05-02 MED ORDER — LABETALOL HCL 5 MG/ML IV SOLN: 10.0000 mg | INTRAVENOUS | Status: DC | PRN

## 2019-05-02 MED ORDER — SODIUM CHLORIDE 0.9 % IV SOLN: 250.0000 mL | INTRAVENOUS | Status: DC | PRN

## 2019-05-02 MED ORDER — LIDOCAINE HCL (PF) 1 % IJ SOLN
INTRAMUSCULAR | Status: AC
  Filled 2019-05-02: qty 30

## 2019-05-02 MED ORDER — LIDOCAINE HCL (PF) 1 % IJ SOLN
INTRAMUSCULAR | Status: DC | PRN
  Administered 2019-05-02: 2 mL
  Administered 2019-05-02: 10 mL

## 2019-05-02 MED ORDER — SODIUM CHLORIDE 0.9 % IV SOLN
INTRAVENOUS | Status: AC | PRN
  Administered 2019-05-02: 10 mL/h via INTRAVENOUS

## 2019-05-02 MED ORDER — HYDRALAZINE HCL 20 MG/ML IJ SOLN: 10.0000 mg | INTRAMUSCULAR | Status: DC | PRN

## 2019-05-02 SURGICAL SUPPLY — 10 items
CATH SWAN GANZ 7F STRAIGHT (CATHETERS) ×2 IMPLANT
KIT MICROPUNCTURE NIT STIFF (SHEATH) ×2 IMPLANT
PACK CARDIAC CATHETERIZATION (CUSTOM PROCEDURE TRAY) ×2 IMPLANT
PROTECTION STATION PRESSURIZED (MISCELLANEOUS) ×2
SHEATH GLIDE SLENDER 4/5FR (SHEATH) ×2 IMPLANT
SHEATH PINNACLE 7F 10CM (SHEATH) ×2 IMPLANT
STATION PROTECTION PRESSURIZED (MISCELLANEOUS) ×1 IMPLANT
TRANSDUCER W/STOPCOCK (MISCELLANEOUS) ×2 IMPLANT
TUBING ART PRESS 72  MALE/FEM (TUBING) ×1
TUBING ART PRESS 72 MALE/FEM (TUBING) ×1 IMPLANT

## 2019-05-02 NOTE — Discharge Instructions (Signed)
Right Heart Cath Site Care  This sheet gives you information about how to care for yourself after your procedure. Your health care provider may also give you more specific instructions. If you have problems or questions, contact your health care provider. What can I expect after the procedure? After the procedure, it is common to have:  Bruising and tenderness at the catheter insertion area. Follow these instructions at home: Medicines  Take over-the-counter and prescription medicines only as told by your health care provider. Insertion site care  Follow instructions from your health care provider about how to take care of your insertion site. Make sure you: ? Wash your hands with soap and water before you change your bandage (dressing). If soap and water are not available, use hand sanitizer. ? Remove your dressing as told by your health care provider. In 24 hours  Check your insertion site every day for signs of infection. Check for: ? Redness, swelling, or pain. ? Fluid or blood. ? Pus or a bad smell. ? Warmth.  Do not take baths, swim, or use a hot tub until your health care provider approves.  You may shower 24-48 hours after the procedure, or as directed by your health care provider. ? Remove the dressing and gently wash the site with plain soap and water. ? Pat the area dry with a clean towel. ? Do not rub the site. That could cause bleeding.  Do not apply powder or lotion to the site. Activity   Do not lift anything that is heavier than 10 lb (4.5 kg), or the limit that you are told, until your health care provider says that it is safe. For 5 days  Ask your health care provider when it is okay to: ? Return to work or school. ? Resume usual physical activities or sports. ? Resume sexual activity. General instructions  If the catheter site starts to bleed, put firm pressure on the site. If the bleeding does not stop, get help right away. This is a medical  emergency.  If you went home on the same day as your procedure, a responsible adult should be with you for the first 24 hours after you arrive home.  Keep all follow-up visits as told by your health care provider. This is important. Contact a health care provider if:  You have a fever.  You have redness, swelling, or yellow drainage around your insertion site. Get help right away if:  You have unusual pain at the site.  The catheter insertion area swells very fast.  The insertion area is bleeding, and the bleeding does not stop when you hold steady pressure on the area. These symptoms may represent a serious problem that is an emergency. Do not wait to see if the symptoms will go away. Get medical help right away. Call your local emergency services (911 in the [place] Do not drive yourself to the hospital. Summary  After the procedure, it is common to have bruising and tenderness at the site.  Follow instructions from your health care provider about how to take care of your wound. Check the wound every day for signs of infection.  Do not lift anything that is heavier than 10 lb (4.5 kg), or the limit that you are told, until your health care provider says that it is safe. This information is not intended to replace advice given to you by your health care provider. Make sure you discuss any questions you have with your health care provider. Document  Released: 06/11/2010 Document Revised: 06/14/2017 Document Reviewed: 06/14/2017 Elsevier Patient Education  2020 [employer].

## 2019-05-02 NOTE — Progress Notes (Signed)
Site area: R IJ Site prior to removal: level 0 Pressure applied for: 10 mins Manual: yes Patient status during pull: stable Post pull site: level 0 Post pull instructions given: yes Dressing applied: petroleum gauze, gauze, and tegaderm

## 2019-05-02 NOTE — Interval H&P Note (Signed)
History and Physical Interval Note:  05/02/2019 11:03 AM  Lawrence Bonilla  has presented today for surgery, with the diagnosis of chf.  The various methods of treatment have been discussed with the patient and family. After consideration of risks, benefits and other options for treatment, the patient has consented to  Procedure(s): RIGHT HEART CATH (N/A) as a surgical intervention.  The patient's history has been reviewed, patient examined, no change in status, stable for surgery.  I have reviewed the patient's chart and labs.  Questions were answered to the patient's satisfaction.     Lawrence Bonilla

## 2019-05-03 ENCOUNTER — Other Ambulatory Visit (HOSPITAL_COMMUNITY): Payer: Self-pay | Admitting: *Deleted

## 2019-05-06 ENCOUNTER — Telehealth (INDEPENDENT_AMBULATORY_CARE_PROVIDER_SITE_OTHER): Payer: Self-pay | Admitting: Internal Medicine

## 2019-05-06 ENCOUNTER — Other Ambulatory Visit (HOSPITAL_COMMUNITY)
Admission: RE | Admit: 2019-05-06 | Discharge: 2019-05-06 | Disposition: A | Discharge status: Home / Self Care | Payer: 59 | Source: Ambulatory Visit | Attending: Internal Medicine | Admitting: Internal Medicine

## 2019-05-06 ENCOUNTER — Other Ambulatory Visit (INDEPENDENT_AMBULATORY_CARE_PROVIDER_SITE_OTHER): Payer: Self-pay | Admitting: Internal Medicine

## 2019-05-06 DIAGNOSIS — Z20828 Contact with and (suspected) exposure to other viral communicable diseases: Secondary | ICD-10-CM | POA: Diagnosis present

## 2019-05-06 LAB — SARS CORONAVIRUS 2 (TAT 6-24 HRS): SARS Coronavirus 2: NEGATIVE

## 2019-05-06 MED ORDER — ENTRESTO 24-26 MG PO TABS: 1.0000 | ORAL_TABLET | Freq: Two times a day (BID) | ORAL | 3 refills | Status: DC

## 2019-05-06 NOTE — Telephone Encounter (Signed)
Done

## 2019-05-07 ENCOUNTER — Telehealth: Payer: Self-pay | Admitting: *Deleted

## 2019-05-07 NOTE — Telephone Encounter (Signed)
-----   Message from Lauralee Evener, [place] sent at 04/29/2019 10:32 AM EST ----- Regarding: RE: precert [employer] auth received. Ok to schedule. Josem Kaufmann [id number]. Valid dates 04/29/19 to 10/26/19. ----- Message ----- From: Freada Bergeron, CMA Sent: 04/25/2019   2:50 PM EST To: Cv Div Sleep Studies Subject: precert                                         recommend CPAP titration

## 2019-05-07 NOTE — Telephone Encounter (Signed)
Patient is scheduled for lab study on 05/08/19. Pt  is scheduled for COVID screening on 05/06/19 prior to titration.  Patient understands his sleep study will be done at [facility]. Patient understands he will receive a sleep packet in a week or so. Patient understands to call if he does not receive the sleep packet in a timely manner. Patient agrees with treatment and thanked me for call.

## 2019-05-08 ENCOUNTER — Other Ambulatory Visit: Payer: Self-pay

## 2019-05-08 ENCOUNTER — Ambulatory Visit: Payer: 59 | Attending: Cardiology | Admitting: Cardiology

## 2019-05-08 DIAGNOSIS — I10 Essential (primary) hypertension: Secondary | ICD-10-CM | POA: Diagnosis not present

## 2019-05-08 DIAGNOSIS — G4733 Obstructive sleep apnea (adult) (pediatric): Secondary | ICD-10-CM | POA: Insufficient documentation

## 2019-05-08 DIAGNOSIS — R0683 Snoring: Secondary | ICD-10-CM | POA: Diagnosis not present

## 2019-05-09 ENCOUNTER — Encounter (INDEPENDENT_AMBULATORY_CARE_PROVIDER_SITE_OTHER): Payer: Self-pay | Admitting: Internal Medicine

## 2019-05-09 ENCOUNTER — Ambulatory Visit (INDEPENDENT_AMBULATORY_CARE_PROVIDER_SITE_OTHER): Payer: Medicare HMO | Admitting: Internal Medicine

## 2019-05-09 VITALS — BP 139/100 | HR 71 | Temp 96.7°F | Ht 72.0 in | Wt 214.6 lb

## 2019-05-09 DIAGNOSIS — E559 Vitamin D deficiency, unspecified: Secondary | ICD-10-CM | POA: Diagnosis not present

## 2019-05-09 DIAGNOSIS — E1122 Type 2 diabetes mellitus with diabetic chronic kidney disease: Secondary | ICD-10-CM | POA: Diagnosis not present

## 2019-05-09 DIAGNOSIS — E039 Hypothyroidism, unspecified: Secondary | ICD-10-CM | POA: Diagnosis not present

## 2019-05-09 DIAGNOSIS — N184 Chronic kidney disease, stage 4 (severe): Secondary | ICD-10-CM | POA: Diagnosis not present

## 2019-05-09 NOTE — Progress Notes (Signed)
Metrics: Intervention Frequency ACO  Documented Smoking Status Yearly  Screened one or more times in 24 months  Cessation Counseling or  Active cessation medication Past 24 months  Past 24 months   Guideline developer: UpToDate (See UpToDate for funding source) Date Released: 2014       Wellness Office Visit  Subjective:  Patient ID: Lawrence Bonilla, male    DOB: Oct 12, 1950  Age: 68 y.o. MRN: [id number]  CC: This man comes in for follow-up of diabetes, hypertension, hypothyroidism, vitamin D deficiency. HPI  He is being followed intensively by cardiology at the present time and he has significant heart failure.  He has an ejection fraction of 25- 30%.  He recently had right heart cath via catheterization and also a sleep study. He continues on increased dose of desiccated NP thyroid for his hypothyroidism.  His diabetes is largely diet controlled and his hemoglobin A1c was less than 7% on the last visit. He continues on antihypertensive therapy and is tolerating this.  Hydralazine has been added to his medication list. Past Medical History:  Diagnosis Date  . Atrial fibrillation ([place])   . Congestive heart failure (CHF) ([place])   . DM type 2 (diabetes mellitus, type 2) ([place])   . Essential hypertension   . Hyperlipidemia   . Hypothyroidism   . Lymphedema   . PVD (peripheral vascular disease) ([place])   . Vitamin D deficiency disease       Family History  Problem Relation Age of Onset  . 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   Social History   Tobacco Use  . Smoking status: Never Smoker  . Smokeless tobacco: Never Used  Substance Use Topics  . Alcohol use: Yes    Comment: 2 SCOTCH     Current Meds  Medication Sig  . apixaban (ELIQUIS) 5 MG TABS tablet Take 1 tablet (5 mg total) by mouth 2 (two) times daily.  Marland Kitchen atorvastatin (LIPITOR) 40  MG tablet Take 1 tablet (40 mg total) by mouth daily.  . calcitRIOL (ROCALTROL) 0.25 MCG capsule Take 0.25 mcg by mouth every Monday, Wednesday, and Friday.   Marland Kitchen CALCIUM PO Take 600 mg by mouth daily. Vitamin D3  . Ferrous Sulfate (IRON PO) Take 65 mg by mouth daily.   . furosemide (LASIX) 40 MG tablet Take 2 tablets (80 mg total) by mouth 2 (two) times daily.  . hydrALAZINE (APRESOLINE) 10 MG tablet Take 1 tablet (10 mg total) by mouth 2 (two) times daily.  . isosorbide mononitrate (IMDUR) 30 MG 24 hr tablet Take 1 tablet (30 mg total) by mouth daily.  . metoprolol succinate (TOPROL-XL) 100 MG 24 hr tablet Take 1 tablet (100 mg total) by mouth 2 (two) times daily. Take with or immediately following a meal. (Patient taking differently: Take 100 mg by mouth 2 (two) times daily. )  . potassium chloride (MICRO-K) 10 MEQ CR capsule Take 10 mEq by mouth daily.   . sacubitril-valsartan (ENTRESTO) 24-26 MG Take 1 tablet by mouth 2 (two) times daily.  Marland Kitchen thyroid (NP THYROID) 60 MG tablet Take 1 tablet (60 mg total) by mouth daily before breakfast.      Objective:   Today's Vitals: BP (!) 139/100 (BP Location: Right Arm, Patient Position: Sitting, Cuff Size: Normal)   Pulse 71   Temp (!) 96.7 F (35.9 C) (Oral)   Ht 6' (  1.829 m)   Wt 214 lb 9.6 oz (97.3 kg)   SpO2 (!) 85%   BMI 29.10 kg/m  Vitals with BMI 05/09/2019 05/02/2019 05/02/2019  Height '6\' 0"'  - -  Weight 214 lbs 10 oz - -  BMI 57.5 - -  Systolic 051 833 582  Diastolic 518 92 99  Pulse 71 62 72     Physical Exam  He looks systemically chronically unwell.  Blood pressure slightly elevated today.  Lung fields are clear.  Alert and orientated.     Assessment   1. Hypothyroidism, adult   2. Type 2 diabetes mellitus with stage 4 chronic kidney disease, without long-term current use of insulin ([place])   3. Vitamin D deficiency disease       Tests ordered Orders Placed This Encounter  Procedures  . CMP with eGFR([facility])  .  Vitamin D, 25-hydroxy  . T3, Free  . TSH  . Hemoglobin A1c     Plan: 1. Blood work is ordered as above. 2. He will continue with desiccated NP thyroid and we will see what the levels show.  We may need to adjust it further. 3. Further recommendations will depend on blood results and I will see him in about 3 months time for follow-up.   No orders of the defined types were placed in this encounter.   Doree Albee, MD

## 2019-05-10 ENCOUNTER — Other Ambulatory Visit (INDEPENDENT_AMBULATORY_CARE_PROVIDER_SITE_OTHER): Payer: Self-pay | Admitting: Internal Medicine

## 2019-05-10 ENCOUNTER — Telehealth: Payer: Self-pay | Admitting: Cardiology

## 2019-05-10 LAB — COMPLETE METABOLIC PANEL WITH GFR
AG Ratio: 1.5 (calc) (ref 1.0–2.5)
ALT: 15 U/L (ref 9–46)
AST: 21 U/L (ref 10–35)
Albumin: 3.9 g/dL (ref 3.6–5.1)
Alkaline phosphatase (APISO): 95 U/L (ref 35–144)
BUN/Creatinine Ratio: 20 (calc) (ref 6–22)
BUN: 44 mg/dL — ABNORMAL HIGH (ref 7–25)
CO2: 27 mmol/L (ref 20–32)
Calcium: 9.2 mg/dL (ref 8.6–10.3)
Chloride: 109 mmol/L (ref 98–110)
Creat: 2.25 mg/dL — ABNORMAL HIGH (ref 0.70–1.25)
GFR, Est African American: 33 mL/min/{1.73_m2} — ABNORMAL LOW (ref >=60)
GFR, Est Non African American: 29 mL/min/{1.73_m2} — ABNORMAL LOW (ref >=60)
Globulin: 2.6 g/dL (calc) (ref 1.9–3.7)
Glucose, Bld: 78 mg/dL (ref 65–99)
Potassium: 4.2 mmol/L (ref 3.5–5.3)
Sodium: 146 mmol/L (ref 135–146)
Total Bilirubin: 1.3 mg/dL — ABNORMAL HIGH (ref 0.2–1.2)
Total Protein: 6.5 g/dL (ref 6.1–8.1)

## 2019-05-10 LAB — T3, FREE: T3, Free: 3.4 pg/mL (ref 2.3–4.2)

## 2019-05-10 LAB — HEMOGLOBIN A1C
Hgb A1c MFr Bld: 5.5 % of total Hgb (ref <5.7)
Mean Plasma Glucose: 111 (calc)
eAG (mmol/L): 6.2 (calc)

## 2019-05-10 LAB — TSH: TSH: 6.46 mIU/L — ABNORMAL HIGH (ref 0.40–4.50)

## 2019-05-10 LAB — VITAMIN D 25 HYDROXY (VIT D DEFICIENCY, FRACTURES): Vit D, 25-Hydroxy: 49 ng/mL (ref 30–100)

## 2019-05-10 MED ORDER — THYROID 90 MG PO TABS: 90.0000 mg | ORAL_TABLET | Freq: Every day | ORAL | 3 refills | Status: DC

## 2019-05-10 NOTE — Telephone Encounter (Signed)
1) What problem are you experiencing? Mask giving to him by Dr. Radford Pax doesn't fit the machine   2) Who is your medical equipment company? [employer]   Please route to the sleep study assistant.

## 2019-05-12 NOTE — Procedures (Signed)
    Patient Name: Lawrence Bonilla, Lawrence Bonilla Date: 05/08/2019 Gender: Male D.O.B: [birth year] Age (years): 73 Referring Provider: Fransico Him MD, ABSM Height (inches): 72 Interpreting Physician: Fransico Him MD, ABSM Weight (lbs): 194 RPSGT: Peak, Robert BMI: 26 MRN: [id number] Neck Size: 14.00  CLINICAL INFORMATION The patient is referred for a BiPAP titration to treat sleep apnea.  SLEEP STUDY TECHNIQUE As per the AASM Manual for the Scoring of Sleep and Associated Events v2.3 (April 2016) with a hypopnea requiring 4% desaturations.  The channels recorded and monitored were frontal, central and occipital EEG, electrooculogram (EOG), submentalis EMG (chin), nasal and oral airflow, thoracic and abdominal wall motion, anterior tibialis EMG, snore microphone, electrocardiogram, and pulse oximetry. Bilevel positive airway pressure (BPAP) was initiated at the beginning of the study and titrated to treat sleep-disordered breathing.  MEDICATIONS Medications self-administered by patient taken the night of the study : N/A  RESPIRATORY PARAMETERS Optimal IPAP Pressure (cm): 14  AHI at Optimal Pressure (/hr) 5.2 Optimal EPAP Pressure (cm):10  Overall Minimal O2 (%):80.0  Minimal O2 at Optimal Pressure (%): 87.0  SLEEP ARCHITECTURE Start Time: 9:24:35 PM  Stop Time:3:29:31 AM  Total Time (min):\364.9  Total Sleep Time (min):212.5 Sleep Latency (min): 13.7  Sleep Efficiency (%):58.2%  REM Latency (min):47.0  WASO (min): 138.7 Stage N1 (%): 16.7%  Stage N2 (%): 71.8%  Stage N3 (%): 0.0%  Stage R (%): 11.5 Supine (%):100.00  Arousal Index (/hr):35.0    CARDIAC DATA The 2 lead EKG demonstrated sinus rhythm. The mean heart rate was 61.2 beats per minute. Other EKG findings include: PVCs . LEG MOVEMENT DATA The total Periodic Limb Movements of Sleep (PLMS) were 0. The PLMS index was 0.0. A PLMS index of <15 is considered normal in adults.  IMPRESSIONS - An optimal PAP pressure could  not be selected for this patient based on the available study data. - Central sleep apnea was not noted during this titration (CAI = 4.5/h). - Severe oxygen desaturations were observed during this titration (min O2 = 80.0%). - The patient snored with moderate snoring volume. - 2-lead EKG demonstrated: PVCs - Clinically significant periodic limb movements were not noted during this study. Arousals associated with PLMs were rare.  DIAGNOSIS - Obstructive Sleep Apnea (327.23 [G47.33 ICD-10])  RECOMMENDATIONS - Recommend a trial of Respironics BiPAP at 14/10cm H2O with heated humidity and mask of choice. - Avoid alcohol, sedatives and other CNS depressants that may worsen sleep apnea and disrupt normal sleep architecture. - Sleep hygiene should be reviewed to assess factors that may improve sleep quality. - Weight management and regular exercise should be initiated or continued. - Return to Sleep Center for re-evaluation after 10 weeks of therapy  [Electronically signed] 05/12/2019 10:40 PM  Fransico Him MD, ABSM Diplomate, American Board of Sleep Medicine

## 2019-05-13 ENCOUNTER — Telehealth: Payer: Self-pay | Admitting: *Deleted

## 2019-05-13 ENCOUNTER — Other Ambulatory Visit (INDEPENDENT_AMBULATORY_CARE_PROVIDER_SITE_OTHER): Payer: Self-pay

## 2019-05-13 MED ORDER — POTASSIUM CHLORIDE ER 10 MEQ PO CPCR: 10.0000 meq | ORAL_CAPSULE | Freq: Every day | ORAL | 1 refills | Status: DC

## 2019-05-13 MED ORDER — CALCITRIOL 0.25 MCG PO CAPS: 0.2500 ug | ORAL_CAPSULE | ORAL | 3 refills | Status: DC

## 2019-05-13 NOTE — Telephone Encounter (Signed)
Informed patient of sleep study results and patient understanding was verbalized. Patient understands his sleep study showed they had a successful PAP titration and let DME know that orders are in EPIC. Please set up 10 week OV with me.   Upon patient request DME selection is [facility]. Patient understands he will be contacted by [facility] to set up his cpap. Patient understands to call if [facility] does not contact him with new setup in a timely manner. Patient understands they will be called once confirmation has been received from [facility] that they have received their new machine to schedule 10 week follow up appointment.  [facility] notified of new cpap order  Please add to [facility]view Patient was grateful for the call and thanked me.    

## 2019-05-13 NOTE — Progress Notes (Signed)
Pt was called and given lab results & instructions on what to do?

## 2019-05-13 NOTE — Telephone Encounter (Signed)
-----   Message from Sueanne Margarita, MD sent at 05/12/2019 10:45 PM EST ----- Please let patient know that they had a successful PAP titration and let DME know that orders are in EPIC.  Please set up 10 week OV with me.

## 2019-05-15 ENCOUNTER — Telehealth (INDEPENDENT_AMBULATORY_CARE_PROVIDER_SITE_OTHER): Payer: Self-pay

## 2019-05-15 ENCOUNTER — Other Ambulatory Visit: Payer: Self-pay

## 2019-05-15 ENCOUNTER — Encounter (INDEPENDENT_AMBULATORY_CARE_PROVIDER_SITE_OTHER): Payer: Self-pay | Admitting: Internal Medicine

## 2019-05-15 ENCOUNTER — Ambulatory Visit (INDEPENDENT_AMBULATORY_CARE_PROVIDER_SITE_OTHER): Payer: Medicare HMO | Admitting: Internal Medicine

## 2019-05-15 DIAGNOSIS — R197 Diarrhea, unspecified: Secondary | ICD-10-CM

## 2019-05-15 MED ORDER — LOPERAMIDE HCL 2 MG PO TABS: 2.0000 mg | ORAL_TABLET | Freq: Four times a day (QID) | ORAL | 0 refills | Status: DC | PRN

## 2019-05-15 NOTE — Telephone Encounter (Signed)
Please call and schedule him a virtual telephone visit this afternoon at 3:20pm

## 2019-05-15 NOTE — Telephone Encounter (Signed)
Appt is set and he will be waiting for your call.

## 2019-05-15 NOTE — Progress Notes (Signed)
Metrics: Intervention Frequency ACO  Documented Smoking Status Yearly  Screened one or more times in 24 months  Cessation Counseling or  Active cessation medication Past 24 months  Past 24 months   Guideline developer: UpToDate (See UpToDate for funding source) Date Released: 2014       Wellness Office Visit  Subjective:  Patient ID: Lawrence Bonilla, male    DOB: 11-17-1950  Age: 68 y.o. MRN: [id number]  CC: This is an audio telemedicine visit with the permission of the patient who is at home and I am in the office.  I was able to easily recognize his voice on the phone. His chief complaint is diarrhea. HPI He has had symptoms of diarrhea for the last 2 to 3 days.  He actually describes loose stools but not very often and certainly not explosive diarrhea.  He denies any nausea, vomiting, abdominal pain or fever.  The diarrhea or loose stools is troubling because he has become incontinent of feces at times. He thinks he may be more short of breath than usual but is not quite sure about this.  Past Medical History:  Diagnosis Date  . Atrial fibrillation ([place])   . Congestive heart failure (CHF) ([place])   . DM type 2 (diabetes mellitus, type 2) ([place])   . Essential hypertension   . Hyperlipidemia   . Hypothyroidism   . Lymphedema   . PVD (peripheral vascular disease) ([place])   . Vitamin D deficiency disease       Family History  Problem Relation Age of Onset  . 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   Social History   Tobacco Use  . Smoking status: Never Smoker  . Smokeless tobacco: Never Used  Substance Use Topics  . Alcohol use: Yes    Comment: 2 SCOTCH     Current Meds  Medication Sig  . apixaban (ELIQUIS) 5 MG TABS tablet Take 1 tablet (5 mg total) by mouth 2 (two) times daily.  Marland Kitchen atorvastatin (LIPITOR) 40 MG tablet Take 1 tablet (40 mg  total) by mouth daily.  . calcitRIOL (ROCALTROL) 0.25 MCG capsule Take 1 capsule (0.25 mcg total) by mouth every Monday, Wednesday, and Friday.  Marland Kitchen CALCIUM PO Take 600 mg by mouth daily. Vitamin D3  . Ferrous Sulfate (IRON PO) Take 65 mg by mouth daily.   . furosemide (LASIX) 40 MG tablet Take 2 tablets (80 mg total) by mouth 2 (two) times daily.  . hydrALAZINE (APRESOLINE) 10 MG tablet Take 1 tablet (10 mg total) by mouth 2 (two) times daily.  . isosorbide mononitrate (IMDUR) 30 MG 24 hr tablet Take 1 tablet (30 mg total) by mouth daily.  . metoprolol succinate (TOPROL-XL) 100 MG 24 hr tablet Take 1 tablet (100 mg total) by mouth 2 (two) times daily. Take with or immediately following a meal. (Patient taking differently: Take 100 mg by mouth 2 (two) times daily. )  . potassium chloride (MICRO-K) 10 MEQ CR capsule Take 1 capsule (10 mEq total) by mouth daily.  . sacubitril-valsartan (ENTRESTO) 24-26 MG Take 1 tablet by mouth 2 (two) times daily.  Marland Kitchen thyroid (NP THYROID) 90 MG tablet Take 1 tablet (90 mg total) by mouth daily.     Objective:   Today's Vitals: There were no vitals taken for this visit. Vitals with BMI 05/15/2019 05/09/2019 05/02/2019  Height (No Data) 6'  0" -  Weight (No Data) 214 lbs 10 oz -  BMI - 123XX123 -  Systolic (No Data) [id number]  Diastolic (No Data) 123XX123 92  Pulse - 71 62     Physical Exam  He appeared to be alert and orientated on the phone.     Assessment   1. Diarrhea, unspecified type       Tests ordered No orders of the defined types were placed in this encounter.    Plan: 1. Since he does not have a fever, I recommended that he take precautions and keep hydrated and hopefully the diarrhea will resolve as this may be a viral illness.  I have sent a prescription for Imodium in case he should need it.  If the diarrhea continues, he will call us back as this also may be a symptom of COVID-19 disease.  He denies any fever or new respiratory symptoms at  the present time. 2. This phone call lasted 5 minutes   Meds ordered this encounter  Medications  . loperamide (IMODIUM A-D) 2 MG tablet    Sig: Take 1 tablet (2 mg total) by mouth 4 (four) times daily as needed for diarrhea or loose stools.    Dispense:  30 tablet    Refill:  0    Thomasine Klutts Luther Parody, MD

## 2019-05-20 ENCOUNTER — Other Ambulatory Visit (INDEPENDENT_AMBULATORY_CARE_PROVIDER_SITE_OTHER): Payer: Self-pay | Admitting: Internal Medicine

## 2019-05-20 ENCOUNTER — Telehealth (INDEPENDENT_AMBULATORY_CARE_PROVIDER_SITE_OTHER): Payer: Self-pay | Admitting: Internal Medicine

## 2019-05-20 DIAGNOSIS — I10 Essential (primary) hypertension: Secondary | ICD-10-CM

## 2019-05-20 DIAGNOSIS — I480 Paroxysmal atrial fibrillation: Secondary | ICD-10-CM

## 2019-05-20 DIAGNOSIS — I5023 Acute on chronic systolic (congestive) heart failure: Secondary | ICD-10-CM

## 2019-05-20 MED ORDER — METOPROLOL SUCCINATE ER 100 MG PO TB24: 100.0000 mg | ORAL_TABLET | Freq: Two times a day (BID) | ORAL | 3 refills | Status: DC

## 2019-05-20 NOTE — Telephone Encounter (Signed)
Done

## 2019-06-04 ENCOUNTER — Telehealth (INDEPENDENT_AMBULATORY_CARE_PROVIDER_SITE_OTHER): Payer: Self-pay | Admitting: Internal Medicine

## 2019-06-04 ENCOUNTER — Other Ambulatory Visit (INDEPENDENT_AMBULATORY_CARE_PROVIDER_SITE_OTHER): Payer: Self-pay | Admitting: Internal Medicine

## 2019-06-04 MED ORDER — FUROSEMIDE 40 MG PO TABS: 80.0000 mg | ORAL_TABLET | Freq: Two times a day (BID) | ORAL | 3 refills | Status: DC

## 2019-06-04 NOTE — Telephone Encounter (Signed)
Done

## 2019-06-04 NOTE — Telephone Encounter (Signed)
I would rather not have any more people today.  I can do a telemedicine visit with him on Thursday at around 11:40 AM.

## 2019-06-05 ENCOUNTER — Other Ambulatory Visit (INDEPENDENT_AMBULATORY_CARE_PROVIDER_SITE_OTHER): Payer: Self-pay

## 2019-06-05 MED ORDER — ENTRESTO 24-26 MG PO TABS: 1.0000 | ORAL_TABLET | Freq: Two times a day (BID) | ORAL | 3 refills | Status: DC

## 2019-06-05 MED ORDER — APIXABAN 5 MG PO TABS: 5.0000 mg | ORAL_TABLET | Freq: Two times a day (BID) | ORAL | 3 refills | Status: DC

## 2019-06-06 ENCOUNTER — Ambulatory Visit (INDEPENDENT_AMBULATORY_CARE_PROVIDER_SITE_OTHER): Payer: Medicare HMO | Admitting: Internal Medicine

## 2019-06-06 ENCOUNTER — Encounter (INDEPENDENT_AMBULATORY_CARE_PROVIDER_SITE_OTHER): Payer: Self-pay | Admitting: Internal Medicine

## 2019-06-06 DIAGNOSIS — J40 Bronchitis, not specified as acute or chronic: Secondary | ICD-10-CM | POA: Diagnosis not present

## 2019-06-06 MED ORDER — AZITHROMYCIN 250 MG PO TABS: ORAL_TABLET | ORAL | 0 refills | Status: DC

## 2019-06-06 NOTE — Progress Notes (Signed)
Metrics: Intervention Frequency ACO  Documented Smoking Status Yearly  Screened one or more times in 24 months  Cessation Counseling or  Active cessation medication Past 24 months  Past 24 months   Guideline developer: UpToDate (See UpToDate for funding source) Date Released: 2014       Wellness Office Visit  Subjective:  Patient ID: Lawrence Bonilla, male    DOB: June 11, 1950  Age: 69 y.o. MRN: [id number]  CC: This is an audio telemedicine visit with the patient who is at home and I am in my office.  I was able to easily recognize his voice. Chief complaint is cough. HPI  He tells me that he has had a cough, largely productive for the last 2 weeks and mostly white sputum but sometimes yellow sputum also.  He does not feel his breathing is much worse.  He denies any fever.  He has not been in contact with any person with COVID-19 disease, to his knowledge. Past Medical History:  Diagnosis Date  . Atrial fibrillation ([place])   . Congestive heart failure (CHF) ([place])   . DM type 2 (diabetes mellitus, type 2) ([place])   . Essential hypertension   . Hyperlipidemia   . Hypothyroidism   . Lymphedema   . PVD (peripheral vascular disease) ([place])   . Vitamin D deficiency disease       Family History  Problem Relation Age of Onset  . 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   Social History   Tobacco Use  . Smoking status: Never Smoker  . Smokeless tobacco: Never Used  Substance Use Topics  . Alcohol use: Yes    Comment: 2 SCOTCH     Current Meds  Medication Sig  . apixaban (ELIQUIS) 5 MG TABS tablet Take 1 tablet (5 mg total) by mouth 2 (two) times daily.  Marland Kitchen atorvastatin (LIPITOR) 40 MG tablet Take 1 tablet (40 mg total) by mouth daily.  . calcitRIOL (ROCALTROL) 0.25 MCG capsule Take 1 capsule (0.25 mcg total) by mouth every Monday, Wednesday, and  Friday.  Marland Kitchen CALCIUM PO Take 600 mg by mouth daily. Vitamin D3  . Ferrous Sulfate (IRON PO) Take 65 mg by mouth daily.   . furosemide (LASIX) 40 MG tablet Take 2 tablets (80 mg total) by mouth 2 (two) times daily.  . hydrALAZINE (APRESOLINE) 10 MG tablet Take 1 tablet (10 mg total) by mouth 2 (two) times daily.  . isosorbide mononitrate (IMDUR) 30 MG 24 hr tablet Take 1 tablet (30 mg total) by mouth daily.  Marland Kitchen loperamide (IMODIUM A-D) 2 MG tablet Take 1 tablet (2 mg total) by mouth 4 (four) times daily as needed for diarrhea or loose stools.  . metoprolol succinate (TOPROL-XL) 100 MG 24 hr tablet Take 1 tablet (100 mg total) by mouth 2 (two) times daily.  . potassium chloride (MICRO-K) 10 MEQ CR capsule Take 1 capsule (10 mEq total) by mouth daily.  . sacubitril-valsartan (ENTRESTO) 24-26 MG Take 1 tablet by mouth 2 (two) times daily.  Marland Kitchen thyroid (NP THYROID) 90 MG tablet Take 1 tablet (90 mg total) by mouth daily.      Objective:   Today's Vitals: There were no vitals taken for this visit. Vitals with BMI 06/06/2019 05/15/2019 05/09/2019  Height (No Data) (No Data) 6\' 0"   Weight (No Data) (No Data) 214 lbs 10 oz  BMI - -  123XX123  Systolic (No Data) (No Data) XX123456  Diastolic (No Data) (No Data) 100  Pulse - - 71     Physical Exam  His voice appeared to be normal on the phone and he appeared to be alert and orientated and he did not appear to be dyspneic whilst he was talking.     Assessment   1. Bronchitis       Tests ordered No orders of the defined types were placed in this encounter.    Plan: 1. I have sent a prescription for antibiotics to see if this will help him but I have also told him that if he should not improve in the next 3 to 4 days, he will need to be evaluated either in the urgent care or emergency room and he understands this. 2. Today I spent 5 minutes and 29 seconds on the phone with this patient.   Meds ordered this encounter  Medications  .  azithromycin (ZITHROMAX) 250 MG tablet    Sig: Take 2 tablets the first day and then 1 tablet every day for the next 4 days    Dispense:  6 tablet    Refill:  0    Shatana Saxton Luther Parody, MD

## 2019-06-14 ENCOUNTER — Ambulatory Visit: Payer: Medicare HMO | Admitting: Cardiology

## 2019-06-14 ENCOUNTER — Other Ambulatory Visit: Payer: Self-pay

## 2019-06-14 ENCOUNTER — Other Ambulatory Visit: Payer: Self-pay | Admitting: *Deleted

## 2019-06-14 ENCOUNTER — Other Ambulatory Visit: Payer: Self-pay | Admitting: Cardiology

## 2019-06-14 ENCOUNTER — Encounter: Payer: Self-pay | Admitting: Cardiology

## 2019-06-14 ENCOUNTER — Telehealth: Payer: Self-pay | Admitting: Cardiology

## 2019-06-14 VITALS — BP 126/80 | HR 78 | Ht 72.0 in | Wt 214.0 lb

## 2019-06-14 DIAGNOSIS — G4733 Obstructive sleep apnea (adult) (pediatric): Secondary | ICD-10-CM

## 2019-06-14 DIAGNOSIS — N1832 Chronic kidney disease, stage 3b: Secondary | ICD-10-CM

## 2019-06-14 DIAGNOSIS — I5022 Chronic systolic (congestive) heart failure: Secondary | ICD-10-CM | POA: Diagnosis not present

## 2019-06-14 DIAGNOSIS — I4821 Permanent atrial fibrillation: Secondary | ICD-10-CM | POA: Diagnosis not present

## 2019-06-14 DIAGNOSIS — I890 Lymphedema, not elsewhere classified: Secondary | ICD-10-CM | POA: Diagnosis not present

## 2019-06-14 MED ORDER — METOLAZONE 2.5 MG PO TABS: 2.5000 mg | ORAL_TABLET | ORAL | 0 refills | Status: DC

## 2019-06-14 NOTE — Telephone Encounter (Signed)
Pt left message on wanting to know if his wife could come in with him for his apt this afternoon w/ Dr. Domenic Polite   [phone]

## 2019-06-14 NOTE — Telephone Encounter (Signed)
Advised patient that its okay for his wife to accompany him to visit today.

## 2019-06-14 NOTE — Progress Notes (Signed)
Cardiology Office Note  Date: 06/14/2019   ID: Lawrence Bonilla 11/08/1950, MRN [id number]  PCP:  Lawrence Albee, MD  Cardiologist:  Lawrence Lesches, MD Electrophysiologist:  None   Chief Complaint  Patient presents with  . Cardiac follow-up    History of Present Illness: Lawrence Bonilla is a 69 y.o. male last seen in November 2020.  He has had subsequent follow-up and management by Dr. Haroldine Bonilla in the heart failure clinic, I reviewed notes.  He is here today with his wife.  He reports recent cough and chest congestion, was treated for bronchitis with a course of antibiotics by PCP.  No fevers or chills.  He also states his weight has climbed at least 5 pounds.  We went over his medications today, he reports compliance.  No longer on potassium supplements after follow-up with nephrology.  I did review his most recent lab work.  He is taking Lasix 80 mg twice daily and does not report any excess fluid intake.  Past Medical History:  Diagnosis Date  . Atrial fibrillation ([place])   . CKD (chronic kidney disease) stage 3, GFR 30-59 ml/min   . Congestive heart failure (CHF) ([place])   . DM type 2 (diabetes mellitus, type 2) ([place])   . Essential hypertension   . Hyperlipidemia   . Hypothyroidism   . Lymphedema   . OSA (obstructive sleep apnea)   . PVD (peripheral vascular disease) ([place])   . Secondary cardiomyopathy ([place])   . Vitamin D deficiency disease     Past Surgical History:  Procedure Laterality Date  . APPENDECTOMY    . BARIATRIC SURGERY    . RIGHT HEART CATH N/A 05/02/2019   Procedure: RIGHT HEART CATH;  Surgeon: Jolaine Artist, MD;  Location: [facility];  Service: Cardiovascular;  Laterality: N/A;    Current Outpatient Medications  Medication Sig Dispense Refill  . apixaban (ELIQUIS) 5 MG TABS tablet Take 1 tablet (5 mg total) by mouth 2 (two) times daily. 60 tablet 3  . atorvastatin (LIPITOR) 40 MG tablet Take 1 tablet (40 mg total) by mouth  daily. 90 tablet 0  . calcitRIOL (ROCALTROL) 0.25 MCG capsule Take 0.25 mcg by mouth daily.    Marland Kitchen CALCIUM PO Take 600 mg by mouth daily. Vitamin D3    . Ferrous Sulfate (IRON PO) Take 65 mg by mouth daily.     . furosemide (LASIX) 40 MG tablet Take 2 tablets (80 mg total) by mouth 2 (two) times daily. 120 tablet 3  . hydrALAZINE (APRESOLINE) 10 MG tablet Take 1 tablet (10 mg total) by mouth 2 (two) times daily. 60 tablet 4  . isosorbide mononitrate (IMDUR) 30 MG 24 hr tablet Take 1 tablet (30 mg total) by mouth daily. 90 tablet 0  . loperamide (IMODIUM A-D) 2 MG tablet Take 1 tablet (2 mg total) by mouth 4 (four) times daily as needed for diarrhea or loose stools. 30 tablet 0  . metoprolol succinate (TOPROL-XL) 100 MG 24 hr tablet Take 1 tablet (100 mg total) by mouth 2 (two) times daily. 60 tablet 3  . sacubitril-valsartan (ENTRESTO) 24-26 MG Take 1 tablet by mouth 2 (two) times daily. 60 tablet 3  . thyroid (NP THYROID) 90 MG tablet Take 1 tablet (90 mg total) by mouth daily. 30 tablet 3  . metolazone (ZAROXOLYN) 2.5 MG tablet Take 1 tablet (2.5 mg total) by mouth as directed. Take one tablet on Saturday and Sunday to get weight  down 5-6 lbs 10 tablet 0   No current facility-administered medications for this visit.   Allergies:  Patient has no known allergies.   Social History: The patient  reports that he has never smoked. He has never used smokeless tobacco. He reports current alcohol use. He reports that he does not use drugs.   ROS:  Please see the history of present illness. Otherwise, complete review of systems is positive for chronic fatigue.  All other systems are reviewed and negative.   Physical Exam: VS:  BP 126/80   Pulse 78   Ht 6' (1.829 m)   Wt 214 lb (97.1 kg)   SpO2 96%   BMI 29.02 kg/m , BMI Body mass index is 29.02 kg/m.  Wt Readings from Last 3 Encounters:  06/14/19 214 lb (97.1 kg)  05/09/19 214 lb 9.6 oz (97.3 kg)  05/02/19 208 lb (94.3 kg)    General:  Chronically ill-appearing male, no distress. HEENT: Conjunctiva and lids normal, wearing a mask. Neck: Supple, elevated JVP, no carotid bruits. Lungs: Decreased breath sounds, nonlabored breathing at rest. Cardiac: Irregularly irregular, no S3, soft apical systolic murmur. Abdomen: Protuberant, nontender, bowel sounds present. Extremities: Chronic appearing lymphedema.. Skin: Warm and dry. Musculoskeletal: No kyphosis. Neuropsychiatric: Alert and oriented x3, affect grossly appropriate.  ECG:  An ECG dated 05/02/2019 was personally reviewed today and demonstrated:  Atrial fibrillation with leftward axis, nonspecific ST-T changes.  Recent Labwork: 01/28/2019: Magnesium 2.4 04/26/2019: B Natriuretic Peptide >4,500.0; Platelets 91 05/02/2019: Hemoglobin 12.2 05/09/2019: ALT 15; AST 21; BUN 44; Creat 2.25; Potassium 4.2; Sodium 146; TSH 6.46   Other Studies Reviewed Today:  Right heart catheterization 05/02/2019: Findings:  RA = 18 RV = 66/14 PA = 69/30 (42) PCW = 24 Fick cardiac output/index = 4.1/1.9 Thermo CO/CI = 4.0/1.8 PVR = 4.5 WU Ao sat = 99% PA sat = 55%, 56%  Assessment:  1. Moderately elevated biventricular pressures with low cardiac output with restrictive cardiomypathy  Plan/Discussion:  Options very limited with CKD and other comorbidities. Will push diuresis gently.   Echocardiogram 01/10/2019:  1. The left ventricle has severely reduced systolic function, with an ejection fraction of 25-30%. The cavity size was mildly dilated. Left ventricular diastolic Doppler parameters are indeterminate.  2. The right ventricle has mildly reduced systolic function. The cavity was moderately enlarged. There is no increase in right ventricular wall thickness.  3. Left atrial size was severely dilated.  4. Right atrial size was severely dilated.  5. No evidence of mitral valve stenosis.  6. Tricuspid valve regurgitation is moderate.  7. The aortic valve is tricuspid. No  stenosis of the aortic valve.  8. The aorta is normal unless otherwise noted.  9. The aortic root is normal in size and structure. 10. Pulmonary hypertension is moderately elevated, PASP is 63 mmHg.  Assessment and Plan:  1.  Chronic systolic heart failure with LVEF 25 to 30% associated with RV dysfunction and pulmonary hypertension.  He is being managed medically, angiography not pursued in light of CKD stage IIIb-IV and risk of contrast nephropathy.  He has NYHA class IIIb symptoms with evidence of fluid overload.  Continue Toprol-XL, Entresto, hydralazine, and Lasix.  We will add metolazone 2.5 mg to be taken daily over the weekend in an effort to get his weight down 5 or 6 pounds.  Plan to use this as needed from that point forward unless standing dose required or potentially a switch from Lasix to Demadex.  Keep follow-up in the  heart failure clinic.  2.  CKD stage IIIb-IV.  Recent lab work showed creatinine 2.25, potassium 4.2.  3.  Chronic hypoxic respiratory failure with OSA/OHS.  On CPAP.  4.  Permanent atrial fibrillation, continue heart rate control strategy as well as Eliquis for stroke prophylaxis.  5.  Chronic lymphedema.  Medication Adjustments/Labs and Tests Ordered: Current medicines are reviewed at length with the patient today.  Concerns regarding medicines are outlined above.   Tests Ordered: Orders Placed This Encounter  Procedures  . Basic metabolic panel    Medication Changes: Meds ordered this encounter  Medications  . metolazone (ZAROXOLYN) 2.5 MG tablet    Sig: Take 1 tablet (2.5 mg total) by mouth as directed. Take one tablet on Saturday and Sunday to get weight down 5-6 lbs    Dispense:  10 tablet    Refill:  0    06/14/2019 NEW    Disposition:  Follow up Heart failure clinic and then [facility] in the next few months.  Signed, [name], MD, [facility] 06/14/2019 2:56 PM    [facility] [place], [place], [place]  [address] Phone: [phone]; Fax: [phone]

## 2019-06-14 NOTE — Patient Instructions (Addendum)
Medication Instructions:   Your physician has recommended you make the following change in your medication:   Start metolazone 2.5 mg taking one on Saturday (01/23) and Sunday (01/24) to get weight down 5-6 lbs dow as directed.  Continue other medications the same  Labwork:  Your physician recommends that you return for lab work in: 10 days to check your BMET. You may have this done at [facility].  Testing/Procedures:  NONE  Follow-Up:  Your physician recommends that you schedule a follow-up appointment in: 3 months (office).  Please schedule a sooner appointment with Dr. Sung Amabile or his extender.   Any Other Special Instructions Will Be Listed Below (If Applicable). Your physician recommends that you weigh, daily, at the same time every day, and in the same amount of clothing. Please record your daily weights.  If you need a refill on your cardiac medications before your next appointment, please call your pharmacy.

## 2019-06-19 ENCOUNTER — Ambulatory Visit (INDEPENDENT_AMBULATORY_CARE_PROVIDER_SITE_OTHER): Payer: Medicare HMO | Admitting: Internal Medicine

## 2019-06-19 ENCOUNTER — Other Ambulatory Visit: Payer: Self-pay

## 2019-06-19 ENCOUNTER — Encounter (INDEPENDENT_AMBULATORY_CARE_PROVIDER_SITE_OTHER): Payer: Self-pay | Admitting: Internal Medicine

## 2019-06-19 VITALS — BP 128/70 | HR 78 | Temp 97.3°F | Resp 18 | Ht 72.0 in | Wt 200.0 lb

## 2019-06-19 DIAGNOSIS — J449 Chronic obstructive pulmonary disease, unspecified: Secondary | ICD-10-CM

## 2019-06-19 DIAGNOSIS — I5023 Acute on chronic systolic (congestive) heart failure: Secondary | ICD-10-CM | POA: Diagnosis not present

## 2019-06-19 NOTE — Progress Notes (Signed)
Metrics: Intervention Frequency ACO  Documented Smoking Status Yearly  Screened one or more times in 24 months  Cessation Counseling or  Active cessation medication Past 24 months  Past 24 months   Guideline developer: UpToDate (See UpToDate for funding source) Date Released: 2014       Wellness Office Visit  Subjective:  Patient ID: Lawrence Bonilla, male    DOB: [birth year]  Age: 69 y.o. MRN: [id number]  CC: This man comes in because of cough and dyspnea and bilateral leg swelling. HPI I had done a telemedicine visit with him a couple of weeks ago and treated him for bronchitis but I told him if he did not improve, he should be seen physically.  He was seen by cardiology 5 days ago and Dr. Domenic Polite initiated metolazone 2.5 mg daily as he clearly had gained weight and was in acute on chronic systolic congestive heart failure.  Since that time in the last 5 days, he has lost 14 pounds, does feel better and is moving around more but still has a cough and dyspneic also.  Past Medical History:  Diagnosis Date  . Atrial fibrillation ([place])   . CKD (chronic kidney disease) stage 3, GFR 30-59 ml/min   . Congestive heart failure (CHF) ([place])   . DM type 2 (diabetes mellitus, type 2) ([place])   . Essential hypertension   . Hyperlipidemia   . Hypothyroidism   . Lymphedema   . OSA (obstructive sleep apnea)   . PVD (peripheral vascular disease) ([place])   . Secondary cardiomyopathy ([place])   . Vitamin D deficiency disease       Family History  Problem Relation Age of Onset  . 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   Social History   Tobacco Use  . Smoking status: Never Smoker  . Smokeless tobacco: Never Used  Substance Use Topics  . Alcohol use: Yes    Comment: 2 SCOTCH     Current Meds  Medication Sig  . apixaban (ELIQUIS) 5 MG TABS tablet Take 1 tablet  (5 mg total) by mouth 2 (two) times daily.  Marland Kitchen atorvastatin (LIPITOR) 40 MG tablet Take 1 tablet (40 mg total) by mouth daily.  . calcitRIOL (ROCALTROL) 0.25 MCG capsule Take 0.25 mcg by mouth daily.  Marland Kitchen CALCIUM PO Take 600 mg by mouth daily. Vitamin D3  . Ferrous Sulfate (IRON PO) Take 65 mg by mouth daily.   . furosemide (LASIX) 40 MG tablet Take 2 tablets (80 mg total) by mouth 2 (two) times daily.  . hydrALAZINE (APRESOLINE) 10 MG tablet Take 1 tablet (10 mg total) by mouth 2 (two) times daily.  . isosorbide mononitrate (IMDUR) 30 MG 24 hr tablet Take 1 tablet (30 mg total) by mouth daily.  Marland Kitchen loperamide (IMODIUM A-D) 2 MG tablet Take 1 tablet (2 mg total) by mouth 4 (four) times daily as needed for diarrhea or loose stools.  . metolazone (ZAROXOLYN) 2.5 MG tablet Take 1 tablet (2.5 mg total) by mouth as directed. Take one tablet on Saturday and Sunday to get weight down 5-6 lbs  . metoprolol succinate (TOPROL-XL) 100 MG 24 hr tablet Take 1 tablet (100 mg total) by mouth 2 (two) times daily.  . sacubitril-valsartan (ENTRESTO) 24-26 MG Take 1 tablet by mouth 2 (two) times daily.  Marland Kitchen thyroid (NP THYROID) 90 MG tablet Take 1 tablet (90  mg total) by mouth daily.       Objective:   Today's Vitals: BP 128/70 (BP Location: Right Arm, Patient Position: Sitting, Cuff Size: Normal)   Pulse 78   Temp (!) 97.3 F (36.3 C) (Temporal)   Resp 18   Ht 6' (1.829 m)   Wt 200 lb (90.7 kg)   SpO2 (!) 84% Comment: with mask on; walked from waiting room to exam  room.  BMI 27.12 kg/m  Vitals with BMI 06/19/2019 06/14/2019 06/06/2019  Height 6\' 0"  6\' 0"  (No Data)  Weight 200 lbs 214 lbs (No Data)  BMI [id number] -  Systolic 0000000 [id number] (No Data)  Diastolic 70 80 (No Data)  Pulse 78 78 -     Physical Exam   He has lost 14 pounds in the last 5 days according to our scales. He continues to have pitting bilateral leg edema and lung fields are clear except for reduced air entry in the left more than the  right lower zones.  His oxygen saturation on room air is worrisome ranging from 75 to 85% and he does have peripheral cyanosis.    Assessment   1. Acute on chronic systolic CHF (congestive heart failure) ([place])   2. Chronic obstructive pulmonary disease, unspecified COPD type ([place])       Tests ordered Orders Placed This Encounter  Procedures  . For home use only DME oxygen  . COMPLETE METABOLIC PANEL WITH GFR     Plan: 1. I have told him he must get back on the home oxygen that he is on and we will check electrolytes today.  I probably will increase the metolazone dose as his weight needs to decrease further, roughly 10 pounds further I believe.  I will let him know of the blood results and we will go from there.  He is due to follow-up with cardiology next week also. 2. I have told him that if his breathing gets much worse and he feels uncomfortable, he must go to the emergency room.   No orders of the defined types were placed in this encounter.   Doree Albee, MD

## 2019-06-20 LAB — COMPLETE METABOLIC PANEL WITH GFR
AG Ratio: 1.9 (calc) (ref 1.0–2.5)
ALT: 14 U/L (ref 9–46)
AST: 22 U/L (ref 10–35)
Albumin: 4.2 g/dL (ref 3.6–5.1)
Alkaline phosphatase (APISO): 98 U/L (ref 35–144)
BUN/Creatinine Ratio: 17 (calc) (ref 6–22)
BUN: 69 mg/dL — ABNORMAL HIGH (ref 7–25)
CO2: 31 mmol/L (ref 20–32)
Calcium: 9.7 mg/dL (ref 8.6–10.3)
Chloride: 99 mmol/L (ref 98–110)
Creat: 3.95 mg/dL — ABNORMAL HIGH (ref 0.70–1.25)
GFR, Est African American: 17 mL/min/{1.73_m2} — ABNORMAL LOW (ref >=60)
GFR, Est Non African American: 15 mL/min/{1.73_m2} — ABNORMAL LOW (ref >=60)
Globulin: 2.2 g/dL (calc) (ref 1.9–3.7)
Glucose, Bld: 128 mg/dL — ABNORMAL HIGH (ref 65–99)
Potassium: 3.8 mmol/L (ref 3.5–5.3)
Sodium: 145 mmol/L (ref 135–146)
Total Bilirubin: 2.5 mg/dL — ABNORMAL HIGH (ref 0.2–1.2)
Total Protein: 6.4 g/dL (ref 6.1–8.1)

## 2019-06-20 NOTE — Progress Notes (Signed)
Call to give instructions for him to take Metolazone 2.5mg  EVERY OTHER DAY until he sees Cardiology. He wrote it down and will start tomorrow. Taken daily dose today.

## 2019-06-21 ENCOUNTER — Other Ambulatory Visit (INDEPENDENT_AMBULATORY_CARE_PROVIDER_SITE_OTHER): Payer: Self-pay | Admitting: Internal Medicine

## 2019-06-24 ENCOUNTER — Other Ambulatory Visit (INDEPENDENT_AMBULATORY_CARE_PROVIDER_SITE_OTHER): Payer: Self-pay

## 2019-06-24 DIAGNOSIS — J449 Chronic obstructive pulmonary disease, unspecified: Secondary | ICD-10-CM

## 2019-06-25 ENCOUNTER — Encounter (HOSPITAL_COMMUNITY): Payer: Self-pay

## 2019-06-25 ENCOUNTER — Encounter (INDEPENDENT_AMBULATORY_CARE_PROVIDER_SITE_OTHER): Payer: Self-pay | Admitting: Nurse Practitioner

## 2019-06-25 ENCOUNTER — Other Ambulatory Visit: Payer: Self-pay

## 2019-06-25 ENCOUNTER — Telehealth (HOSPITAL_COMMUNITY): Payer: Self-pay

## 2019-06-25 ENCOUNTER — Ambulatory Visit (HOSPITAL_COMMUNITY)
Admission: RE | Admit: 2019-06-25 | Discharge: 2019-06-25 | Disposition: A | Discharge status: Home / Self Care | Payer: Medicare HMO | Source: Ambulatory Visit | Attending: Cardiology | Admitting: Cardiology

## 2019-06-25 VITALS — BP 106/83 | HR 65 | Wt 181.0 lb

## 2019-06-25 DIAGNOSIS — E039 Hypothyroidism, unspecified: Secondary | ICD-10-CM | POA: Insufficient documentation

## 2019-06-25 DIAGNOSIS — Z6824 Body mass index (BMI) 24.0-24.9, adult: Secondary | ICD-10-CM | POA: Insufficient documentation

## 2019-06-25 DIAGNOSIS — I130 Hypertensive heart and chronic kidney disease with heart failure and stage 1 through stage 4 chronic kidney disease, or unspecified chronic kidney disease: Secondary | ICD-10-CM | POA: Insufficient documentation

## 2019-06-25 DIAGNOSIS — Z9884 Bariatric surgery status: Secondary | ICD-10-CM | POA: Insufficient documentation

## 2019-06-25 DIAGNOSIS — E1151 Type 2 diabetes mellitus with diabetic peripheral angiopathy without gangrene: Secondary | ICD-10-CM | POA: Diagnosis not present

## 2019-06-25 DIAGNOSIS — I5022 Chronic systolic (congestive) heart failure: Secondary | ICD-10-CM

## 2019-06-25 DIAGNOSIS — J9611 Chronic respiratory failure with hypoxia: Secondary | ICD-10-CM | POA: Insufficient documentation

## 2019-06-25 DIAGNOSIS — Z79899 Other long term (current) drug therapy: Secondary | ICD-10-CM | POA: Diagnosis not present

## 2019-06-25 DIAGNOSIS — N184 Chronic kidney disease, stage 4 (severe): Secondary | ICD-10-CM | POA: Diagnosis not present

## 2019-06-25 DIAGNOSIS — Z79890 Hormone replacement therapy (postmenopausal): Secondary | ICD-10-CM | POA: Insufficient documentation

## 2019-06-25 DIAGNOSIS — E785 Hyperlipidemia, unspecified: Secondary | ICD-10-CM | POA: Insufficient documentation

## 2019-06-25 DIAGNOSIS — I4821 Permanent atrial fibrillation: Secondary | ICD-10-CM | POA: Insufficient documentation

## 2019-06-25 DIAGNOSIS — Z7901 Long term (current) use of anticoagulants: Secondary | ICD-10-CM | POA: Diagnosis not present

## 2019-06-25 DIAGNOSIS — E662 Morbid (severe) obesity with alveolar hypoventilation: Secondary | ICD-10-CM | POA: Diagnosis not present

## 2019-06-25 DIAGNOSIS — J449 Chronic obstructive pulmonary disease, unspecified: Secondary | ICD-10-CM

## 2019-06-25 LAB — BASIC METABOLIC PANEL
Anion gap: 18 — ABNORMAL HIGH (ref 5–15)
BUN: 81 mg/dL — ABNORMAL HIGH (ref 8–23)
CO2: 35 mmol/L — ABNORMAL HIGH (ref 22–32)
Calcium: 9.2 mg/dL (ref 8.9–10.3)
Chloride: 90 mmol/L — ABNORMAL LOW (ref 98–111)
Creatinine, Ser: 4.84 mg/dL — ABNORMAL HIGH (ref 0.61–1.24)
GFR calc Af Amer: 13 mL/min — ABNORMAL LOW (ref >60)
GFR calc non Af Amer: 11 mL/min — ABNORMAL LOW (ref >60)
Glucose, Bld: 131 mg/dL — ABNORMAL HIGH (ref 70–99)
Potassium: 2.5 mmol/L — CL (ref 3.5–5.1)
Sodium: 143 mmol/L (ref 135–145)

## 2019-06-25 NOTE — Patient Instructions (Addendum)
Lab work done today. We will notify you of any abnormal lab work. No news is good news!  STOP Lisinopril  MAY TAKE Metolazone 1 tab if you gain 5lbs or more in one week.  Please keep follow up appointment with the [facility] in March.  At the [facility], you and your health needs are our priority. As part of our continuing mission to provide you with exceptional heart care, we have created designated Provider Care Teams. These Care Teams include your primary Cardiologist (physician) and Advanced Practice Providers (APPs- Physician Assistants and Nurse Practitioners) who all work together to provide you with the care you need, when you need it.   You may see any of the following providers on your designated Care Team at your next follow up: Marland Kitchen Dr Glori Bickers . Dr Loralie Champagne . Darrick Grinder, NP . Lyda Jester, PA . Audry Riles, PharmD   Please be sure to bring in all your medications bottles to every appointment.

## 2019-06-25 NOTE — Progress Notes (Signed)
This encounter was created in error - please disregard.

## 2019-06-25 NOTE — Telephone Encounter (Signed)
Called pt x2 along with wife of pt in order to make aware of changes. lmom

## 2019-06-25 NOTE — Telephone Encounter (Signed)
-----   Message from Consuelo Pandy, [place] sent at 06/25/2019  1:56 PM EST ----- K low at 2.5 after metolazone use. Volume ok on exam today and ReDs clip was good at 30%. Stop Entresto. Stop metolazone. Hold Lasix x 2 days. Take Kdur 40 mEQ x 1 today. Repeat BMP on 2/4.

## 2019-06-25 NOTE — Progress Notes (Signed)
ADVANCED HF CLINIC NOTE  Date: 06/25/2019   ID: Reko Traficante, DOB [birth year], MRN [id number]  PCP:  Doree Albee, MD  Cardiologist:  Rozann Lesches, MD Electrophysiologist:  None   Chief Complaint  Patient presents with  . Follow-up    History of Present Illness:  Jadien Cugini is a 69 y.o. male initially referred by Dr. Domenic Polite for further evaluation of biventricular HF.   He has a h/o severe HTN, HL, DM2, lymphedema, PE, CKD 3-4 (baseline creatinine 2.1-2.6), morbid obesity s/p gastric bypass surgery and systolic HF with EF [id number].  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 Moved to [place] in April 2020 due to problems getting up and down stairs so moved close to his daughter and son-in-law in [place]. Now living in their home with them and his wife.    Has h/o gastric bypass surgery about 10-15 years ago by his estimate. Snored severely prior to that and had severe HTN.   Says his heart problems go back about 15 years when he had transient loss of vision. Diagnosed with AF at that time. Does not remember what EF was at that time. Over last 5 years was admitted in [place] with HF.   He was hospitalized at [facility] in September 2019 with worsening shortness of breath, shoulder and chest discomfort.  He was felt to have acute on chronic systolic heart failure with minor nondiagnostic high-sensitivity troponin I elevations in flat pattern, and was ultimately diagnosed with pulmonary embolus based on high probability VQ scan despite being on Coumadin.  He was transitioned from Coumadin ultimately to Eliquis. Discharged with home O2.   Echocardiogram in 8/20 demonstrated LVEF 25 to 30%, also mild RV dysfunction, severe biatrial enlargement, and moderate to severe pulmonary hypertension.  He was switched from ACE inhibitor to [facility] in the setting of persistent renal insufficiency and he has tolerated this change so far.  Creatinine has fluctuated  from a peak of 2.63 on September 10 down to 2.14 on September 28.  This was on both Lasix and Entresto.  He does not remember them telling him any specifics about his heart in [place]. "I think there was a fair amount of Cardiology going on". He does not remember the name of his cardiologist. Says PCP was managing AF. Denies any h/o having a heart cath.  Seen by Dr. Haroldine Laws for the first time on 03/25/2019. 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 Had sleep study 11/20 and had severe OSA with AHI 56% (27% CSA).  RHC done 12/20 and demonstrate moderately elevated biventricular pressures with low cardiac output with restrictive cardiomyopathy. Angiography not pursued in light of CKD stage IIIb-IV and risk of contrast nephropathy. Options felt to be very limited w/ CKD and other co morbidities. Gentle diuresis recommended. Takes lasix daily, along w/ Entresto, Toprol XL and hydralazine.    Most recently, he developed cough and chest congestion and was treated for bronchitis and placed on abx by PCP. Had f/u visit w/ Dr. Domenic Polite on 1/22 and he was noted to be fluid overloaded w/ 6 lb wt gain. He was instructed to take metolazone 2.5 mg x 2 consecutive days, along w/ lasix 80 mg bid, over the weekend for diuresis.   He presents to [facility] today for f/u. In wheel chair. No resting dyspnea but continues w/ exertional dyspnea (chronic) although improved after metolazone. He reports that he has lost 14 lb based on his home scales. Wt  today on our scale is 181 lb (214 lb at Dr. Myles Gip office on 1/22). ReDs clip reading normal at 30%. BP stable.    I[place] 04/2019 Findings:   RA = 18 RV = 66/14 PA = 69/30 (42) PCW = 24 Fick cardiac output/index = 4.1/1.9 Thermo CO/CI = 4.0/1.8 PVR = 4.5 WU Ao sat = 99% PA sat = 55%, 56%  Assessment:  1. Moderately elevated biventricular pressures with low cardiac output with restrictive cardiomypathy  Plan/Discussion:  Options very limited with CKD and other  comorbidities. Will push diuresis gently.   Past Medical History:  Diagnosis Date  . Atrial fibrillation ([place])   . CKD (chronic kidney disease) stage 3, GFR 30-59 ml/min   . Congestive heart failure (CHF) ([place])   . DM type 2 (diabetes mellitus, type 2) ([place])   . Essential hypertension   . Hyperlipidemia   . Hypothyroidism   . Lymphedema   . OSA (obstructive sleep apnea)   . PVD (peripheral vascular disease) ([place])   . Secondary cardiomyopathy ([place])   . Vitamin D deficiency disease     Past Surgical History:  Procedure Laterality Date  . APPENDECTOMY    . BARIATRIC SURGERY    . RIGHT HEART CATH N/A 05/02/2019   Procedure: RIGHT HEART CATH;  Surgeon: Jolaine Artist, MD;  Location: [facility];  Service: Cardiovascular;  Laterality: N/A;    Current Outpatient Medications  Medication Sig Dispense Refill  . apixaban (ELIQUIS) 5 MG TABS tablet Take 1 tablet (5 mg total) by mouth 2 (two) times daily. 60 tablet 3  . atorvastatin (LIPITOR) 40 MG tablet Take 1 tablet (40 mg total) by mouth daily. 90 tablet 0  . calcitRIOL (ROCALTROL) 0.25 MCG capsule Take 0.25 mcg by mouth daily.    Marland Kitchen CALCIUM PO Take 600 mg by mouth daily. Vitamin D3    . Ferrous Sulfate (IRON PO) Take 65 mg by mouth daily.     . furosemide (LASIX) 40 MG tablet Take 2 tablets (80 mg total) by mouth 2 (two) times daily. 120 tablet 3  . hydrALAZINE (APRESOLINE) 10 MG tablet Take 1 tablet (10 mg total) by mouth 2 (two) times daily. 60 tablet 4  . isosorbide mononitrate (IMDUR) 30 MG 24 hr tablet TAKE 1 TABLET(30 MG) BY MOUTH DAILY 90 tablet 0  . loperamide (IMODIUM A-D) 2 MG tablet Take 1 tablet (2 mg total) by mouth 4 (four) times daily as needed for diarrhea or loose stools. 30 tablet 0  . metolazone (ZAROXOLYN) 2.5 MG tablet Take 1 tablet (2.5 mg total) by mouth as directed. Take one tablet on Saturday and Sunday to get weight down 5-6 lbs 10 tablet 0  . metoprolol succinate (TOPROL-XL) 100 MG 24 hr tablet Take  1 tablet (100 mg total) by mouth 2 (two) times daily. 60 tablet 3  . Multiple Vitamins-Minerals (CENTRUM SILVER PO) Take by mouth.    . sacubitril-valsartan (ENTRESTO) 24-26 MG Take 1 tablet by mouth 2 (two) times daily. 60 tablet 3  . thyroid (NP THYROID) 90 MG tablet Take 1 tablet (90 mg total) by mouth daily. 30 tablet 3  . warfarin (COUMADIN) 2.5 MG tablet Take 2.5 mg by mouth daily.     No current facility-administered medications for this encounter.   Allergies:  Patient has no known allergies.   Social History: The patient  reports that he has never smoked. He has never used smokeless tobacco. He reports current alcohol use. He reports that he  does not use drugs.   Family History: The patient's family history includes Asthma in his mother.   ROS:  Please see the history of present illness. Otherwise, complete review of systems is positive for fatigue, dyspnea on exertion.  All other systems are reviewed and negative.   Physical Exam: VS:  BP 106/83   Pulse 65   Wt 82.1 kg (181 lb)   SpO2 91%   BMI 24.55 kg/m , BMI Body mass index is 24.55 kg/m.  Wt Readings from Last 3 Encounters:  06/25/19 82.1 kg (181 lb)  06/19/19 90.7 kg (200 lb)  06/14/19 97.1 kg (214 lb)    General:  Chronically ill appearing elderly WM No resp difficulty HEENT: normal Neck: supple. no JVD. Carotids 2+ bilat; no bruits. No lymphadenopathy or thryomegaly appreciated. Cor: PMI nondisplaced. Irregular rate & rhythm. No rubs, gallops or murmurs. Lungs: decreased BS RLL but no wheezing, rhonchi or rales  Abdomen: obese soft, nontender, nondistended. No hepatosplenomegaly. No bruits or masses. Good bowel sounds. Extremities: no clubbing but nails cyanotic, no rash. Chronic LEE (lymphedema) Neuro: alert & orientedx3, cranial nerves grossly intact. moves all 4 extremities w/o difficulty. Affect pleasant   Recent Labwork: 01/28/2019: Magnesium 2.4 04/26/2019: B Natriuretic Peptide >4,500.0; Platelets  91 05/02/2019: Hemoglobin 12.2 05/09/2019: TSH 6.46 06/19/2019: ALT 14; AST 22; BUN 69; Creat 3.95; Potassium 3.8; Sodium 145   Other Studies Reviewed Today:  Echocardiogram 01/10/2019:  1. The left ventricle has severely reduced systolic function, with an ejection fraction of 25-30%. The cavity size was mildly dilated. Left ventricular diastolic Doppler parameters are indeterminate.  2. The right ventricle has mildly reduced systolic function. The cavity was moderately enlarged. There is no increase in right ventricular wall thickness.  3. Left atrial size was severely dilated.  4. Right atrial size was severely dilated.  5. No evidence of mitral valve stenosis.  6. Tricuspid valve regurgitation is moderate.  7. The aortic valve is tricuspid. No stenosis of the aortic valve.  8. The aorta is normal unless otherwise noted.  9. The aortic root is normal in size and structure. 10. Pulmonary hypertension is moderately elevated, PASP is 63 mmHg.  Assessment and Plan:  1.  Chronic systolic HF with biventricular dysfunction - unclear etiology - echo 8/20 with LVEF 25-30% w/ mildly reduced RV fx and mod-severe PH - Complicated case. He is a relatively poor/eccentric historian so it is hard to know exactly what his cardiac w/u has been to date. - In looking at his echo, suspect he has a restrictive CM due to longstanding uncontrolled HTN and probable OSA/OHS - He denies any h/o left heart cath and is currently not a candidate for coronary angiography (or cMRI) with CKD;  - RHC 12/20 showed moderately elevated biventricular pressures with low cardiac output with restrictive cardiomyopathy. Options felt to be very limited w/ CKD and other co morbidities. Gentle diuresis recommended. - SPEP was normal.  - Currently NYHA IIIB and volume status improved after addition of metolazone x 2 doses. 14 lb wt loss and ReDs clip measurement 30% c/w euvolemia.   - Continue lasix 80 bid - can use 2.5 mg of  metolazone, PRN for wt gain of > 5 lb - Continue hydralazine 10 tid and Imdur 30 daily - Continue Toprol 100 daily - Remains on Entresto 24/26 despite CKD 4. Will continue but will need to follow renal function closely.  - Lisinopril was on med list. Informed pt he should not be taking. He will  check meds when he returns home to confirm if he has been taking and will plan to d/c if so.   - Check BMP today  - Not candidate for SGLT2i with CKD4 - Need to r/o amyloid. Unable to get cMRi given CKD. Myeloma panel negatve. Will order PYP scan.    2. 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  - RHC 12/20 w/ PA = 69/30 (42) - he will need PFTs to further evaluate. This has been ordered but not completed due to covid 19 restrictions  - PSG on 04/04/19 AHI 56 with 27% CSA. This is being followed by Dr. Radford Pax - Continue Eliquis. No bleeding   3. CKD 4 - likely due to longstanding HTN - recent creatinine 3.95 - Repeat BMP today   4.  Permanent atrial fibrillation.   - in setting of severe biatrial enlargement with restrictive CM  - rate controlled w/ metoprolol  - continue Eliquis  5.  Chronic lymphedema. - continue compression hose.  6. Morbid obesity - s/p previous gastric bypass - Body mass index is 24.55 kg/m.   7. Severe OSA/CSA - very complex SA. Likely related to obesity and advanced HF - following with Dr. Radford Pax for CPAP titration  Keep upcomming f/u appt w/ Dr. Haroldine Laws on 3/17.    Lyda Jester, PA-C  12:16 PM

## 2019-06-27 ENCOUNTER — Telehealth: Payer: Self-pay | Admitting: Cardiology

## 2019-06-27 ENCOUNTER — Telehealth (INDEPENDENT_AMBULATORY_CARE_PROVIDER_SITE_OTHER): Payer: Self-pay

## 2019-06-27 NOTE — Telephone Encounter (Signed)
RETURNED CALL: Reached out to [place] and informed her that the order for his Bipap was sent over to [facility] , they send the order to the insurance and it takes 15 business days for them to get back to the dme. Once they get back to them they will call the patient to come and be fitted. Derry Skill says she understood and would relay that to the patient.

## 2019-06-27 NOTE — Telephone Encounter (Signed)
Lawrence Bonilla is calling stating the patient does not have a CPAP or BIPAP machine. Please advise.

## 2019-07-01 NOTE — Telephone Encounter (Signed)
Pt was called and given instructions from Dr Landis Gandy office. The device is in process and will be out from their office to liasion process.

## 2019-07-04 ENCOUNTER — Telehealth (INDEPENDENT_AMBULATORY_CARE_PROVIDER_SITE_OTHER): Payer: Self-pay

## 2019-07-04 NOTE — Telephone Encounter (Signed)
-----   Message from Alfonzo Beers sent at 07/04/2019 10:07 AM EST ----- Regarding: C-pap machine [name] called and needs you to call him back when you get a chance.  He received 2 C-pap machines and no paper came with them so he is not sure what company sent them and he wants to send one of them back so he is not charged for both of them. Call back number is [phone]

## 2019-07-15 ENCOUNTER — Telehealth (INDEPENDENT_AMBULATORY_CARE_PROVIDER_SITE_OTHER): Payer: Self-pay | Admitting: Internal Medicine

## 2019-07-15 ENCOUNTER — Other Ambulatory Visit (INDEPENDENT_AMBULATORY_CARE_PROVIDER_SITE_OTHER): Payer: Self-pay | Admitting: Internal Medicine

## 2019-07-15 MED ORDER — CALCITRIOL 0.25 MCG PO CAPS: 0.2500 ug | ORAL_CAPSULE | Freq: Every day | ORAL | 3 refills | Status: DC

## 2019-07-15 MED ORDER — METOLAZONE 2.5 MG PO TABS: 2.5000 mg | ORAL_TABLET | ORAL | 0 refills | Status: DC

## 2019-07-15 NOTE — Telephone Encounter (Signed)
Done

## 2019-07-16 ENCOUNTER — Encounter (HOSPITAL_COMMUNITY): Payer: Self-pay

## 2019-07-16 NOTE — Progress Notes (Signed)
Note sent after multiple attempts to contact.

## 2019-07-17 ENCOUNTER — Telehealth (INDEPENDENT_AMBULATORY_CARE_PROVIDER_SITE_OTHER): Payer: Self-pay

## 2019-07-17 MED ORDER — ATORVASTATIN CALCIUM 40 MG PO TABS: 40.0000 mg | ORAL_TABLET | Freq: Every day | ORAL | 0 refills | Status: DC

## 2019-07-17 MED ORDER — CALCITRIOL 0.25 MCG PO CAPS: 0.2500 ug | ORAL_CAPSULE | Freq: Every day | ORAL | 3 refills | Status: DC

## 2019-07-17 MED ORDER — HYDRALAZINE HCL 10 MG PO TABS: 10.0000 mg | ORAL_TABLET | Freq: Two times a day (BID) | ORAL | 4 refills | Status: DC

## 2019-07-17 MED ORDER — METOLAZONE 2.5 MG PO TABS: ORAL_TABLET | ORAL | 1 refills | Status: DC

## 2019-07-17 NOTE — Telephone Encounter (Signed)
Lawrence Bonilla, CMA  

## 2019-08-07 ENCOUNTER — Ambulatory Visit (HOSPITAL_COMMUNITY)
Admission: RE | Admit: 2019-08-07 | Discharge: 2019-08-07 | Disposition: A | Discharge status: Home / Self Care | Payer: Medicare HMO | Source: Ambulatory Visit | Attending: Internal Medicine | Admitting: Internal Medicine

## 2019-08-07 ENCOUNTER — Encounter (HOSPITAL_COMMUNITY): Payer: Self-pay | Admitting: Internal Medicine

## 2019-08-07 ENCOUNTER — Other Ambulatory Visit: Payer: Self-pay

## 2019-08-07 VITALS — BP 150/90 | HR 62 | Wt 190.0 lb

## 2019-08-07 DIAGNOSIS — I509 Heart failure, unspecified: Secondary | ICD-10-CM | POA: Diagnosis present

## 2019-08-07 DIAGNOSIS — E1122 Type 2 diabetes mellitus with diabetic chronic kidney disease: Secondary | ICD-10-CM | POA: Diagnosis not present

## 2019-08-07 DIAGNOSIS — G4733 Obstructive sleep apnea (adult) (pediatric): Secondary | ICD-10-CM | POA: Insufficient documentation

## 2019-08-07 DIAGNOSIS — I4821 Permanent atrial fibrillation: Secondary | ICD-10-CM | POA: Insufficient documentation

## 2019-08-07 DIAGNOSIS — E785 Hyperlipidemia, unspecified: Secondary | ICD-10-CM | POA: Diagnosis not present

## 2019-08-07 DIAGNOSIS — I5022 Chronic systolic (congestive) heart failure: Secondary | ICD-10-CM | POA: Insufficient documentation

## 2019-08-07 DIAGNOSIS — Z79899 Other long term (current) drug therapy: Secondary | ICD-10-CM | POA: Insufficient documentation

## 2019-08-07 DIAGNOSIS — Z825 Family history of asthma and other chronic lower respiratory diseases: Secondary | ICD-10-CM | POA: Diagnosis not present

## 2019-08-07 DIAGNOSIS — Z7901 Long term (current) use of anticoagulants: Secondary | ICD-10-CM | POA: Diagnosis not present

## 2019-08-07 DIAGNOSIS — E1151 Type 2 diabetes mellitus with diabetic peripheral angiopathy without gangrene: Secondary | ICD-10-CM | POA: Insufficient documentation

## 2019-08-07 DIAGNOSIS — Z9884 Bariatric surgery status: Secondary | ICD-10-CM | POA: Diagnosis not present

## 2019-08-07 DIAGNOSIS — I130 Hypertensive heart and chronic kidney disease with heart failure and stage 1 through stage 4 chronic kidney disease, or unspecified chronic kidney disease: Secondary | ICD-10-CM | POA: Diagnosis not present

## 2019-08-07 DIAGNOSIS — N184 Chronic kidney disease, stage 4 (severe): Secondary | ICD-10-CM | POA: Insufficient documentation

## 2019-08-07 DIAGNOSIS — I272 Pulmonary hypertension, unspecified: Secondary | ICD-10-CM | POA: Diagnosis not present

## 2019-08-07 DIAGNOSIS — E039 Hypothyroidism, unspecified: Secondary | ICD-10-CM | POA: Insufficient documentation

## 2019-08-07 DIAGNOSIS — Z6825 Body mass index (BMI) 25.0-25.9, adult: Secondary | ICD-10-CM | POA: Diagnosis not present

## 2019-08-07 DIAGNOSIS — I890 Lymphedema, not elsewhere classified: Secondary | ICD-10-CM | POA: Insufficient documentation

## 2019-08-07 DIAGNOSIS — J9611 Chronic respiratory failure with hypoxia: Secondary | ICD-10-CM | POA: Diagnosis not present

## 2019-08-07 MED ORDER — METOPROLOL SUCCINATE ER 50 MG PO TB24: 50.0000 mg | ORAL_TABLET | Freq: Every day | ORAL | 5 refills | Status: DC

## 2019-08-07 NOTE — Patient Instructions (Signed)
STOP Entresto  DECREASE Toprol to 50mg  (1 tab) daily  Follow up with Dr Royce Macadamia on 08/12/19. Please have your wife accompany you to this visit.  Your physician recommends that you schedule a follow-up appointment in: 3 months with Dr Haroldine Laws  Please call office at [phone] option 2 if you have any questions or concerns.   At the [facility], you and your health needs are our priority. As part of our continuing mission to provide you with exceptional heart care, we have created designated Provider Care Teams. These Care Teams include your primary Cardiologist (physician) and Advanced Practice Providers (APPs- Physician Assistants and Nurse Practitioners) who all work together to provide you with the care you need, when you need it.   You may see any of the following providers on your designated Care Team at your next follow up: Marland Kitchen Dr Glori Bickers . Dr Loralie Champagne . Darrick Grinder, NP . Lyda Jester, PA . Audry Riles, PharmD   Please be sure to bring in all your medications bottles to every appointment.

## 2019-08-07 NOTE — Progress Notes (Signed)
ADVANCED HF CLINIC NOTE  Date: 08/07/2019   ID: Lawrence Bonilla, DOB 01/03/1951, MRN [id number]  PCP:  Lawrence Albee, MD  Cardiologist:  Rozann Lesches, MD Electrophysiologist:  None   Chief Complaint  Patient presents with  . Congestive Heart Failure    History of Present Illness:  Lawrence Bonilla is a 69 y.o. male initially referred by Dr. Domenic Polite for further evaluation of biventricular HF.   He has a h/o severe HTN, HL, DM2, lymphedema, PE, CKD 3-4 (baseline creatinine 2.1-2.6), morbid obesity s/p gastric bypass surgery and systolic HF with EF 81-01%.  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 Moved to [place] in April 2020 due to problems getting up and down stairs so moved close to his daughter and son-in-law in [place]. Now living in their home with them and his wife.    Has h/o gastric bypass surgery about 10-15 years ago by his estimate. Snored severely prior to that and had severe HTN.   Says his heart problems go back about 15 years when he had transient loss of vision. Diagnosed with AF at that time. Does not remember what EF was at that time. Over last 5 years was admitted in [place] with HF. He was hospitalized at [facility] in September 2019 with worsening shortness of breath, shoulder and chest discomfort.  He was felt to have acute on chronic systolic heart failure with minor nondiagnostic high-sensitivity troponin I elevations in flat pattern, and was ultimately diagnosed with pulmonary embolus based on high probability VQ scan despite being on Coumadin.  He was transitioned from Coumadin ultimately to Eliquis. Discharged with home O2.   Echocardiogram in 8/20 demonstrated LVEF 25 to 30%, also mild RV dysfunction, severe biatrial enlargement, and moderate to severe pulmonary hypertension.  He was switched from ACE inhibitor to [place].  He does not remember them telling him any specifics about his heart in [place]. "I think there was a  fair amount of Cardiology going on". He does not remember the name of his cardiologist. Says PCP was managing AF. Denies any h/o having a heart cath.  Seen by Dr. Haroldine Laws for the first time on 03/25/2019. 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 Had sleep study 11/20 and had severe OSA with AHI 56% (27% CSA).  RHC done 12/20 and demonstrate moderately elevated biventricular pressures with low cardiac output with restrictive cardiomyopathy. Angiography not pursued in light of CKD stage IIIb-IV and risk of contrast nephropathy. Options felt to be very limited w/ CKD and other co morbidities. Gentle diuresis recommended. Takes lasix daily, along w/ Entresto, Toprol XL and hydralazine.    He presents to [facility] today for f/u. Says he feels ok. Weight has been up and down from 174-185. He is 185 today. He is been concerned lately about his worsening kidney function. Last creatinine was 4.84 in 2/21 this was up from 2.25 in 12/20. Seen by Dr. Royce Macadamia at [facility] about 2 weeks she didn't mention to him whether he would or would not be candidate for dialysis. Feels weak. Appetite quite poor. Can get around the house and do his chores. No CP, orthopnea or PND. On lasix 80 bid. Was taking metolazone 2.5 on 2 days per week and he stopped this because he felt it was taking off too much fluid.   I discussed with Dr. Royce Macadamia and labwork from 3/15 creatinine 3.22 with K 4.3 BUN 61 GFR 19    RHC 04/2019 Findings:  RA = 18 RV = 66/14 PA = 69/30 (42) PCW = 24 Fick cardiac output/index = 4.1/1.9 Thermo CO/CI = 4.0/1.8 PVR = 4.5 WU Ao sat = 99% PA sat = 55%, 56%  Assessment:  1. Moderately elevated biventricular pressures with low cardiac output with restrictive cardiomypathy  Plan/Discussion:  Options very limited with CKD and other comorbidities. Will push diuresis gently.   Past Medical History:  Diagnosis Date  . Atrial fibrillation ([place])   . CKD (chronic kidney disease) stage 3, GFR 30-59 ml/min   .  Congestive heart failure (CHF) ([place])   . DM type 2 (diabetes mellitus, type 2) ([place])   . Essential hypertension   . Hyperlipidemia   . Hypothyroidism   . Lymphedema   . OSA (obstructive sleep apnea)   . PVD (peripheral vascular disease) ([place])   . Secondary cardiomyopathy ([place])   . Vitamin D deficiency disease     Past Surgical History:  Procedure Laterality Date  . APPENDECTOMY    . BARIATRIC SURGERY    . RIGHT HEART CATH N/A 05/02/2019   Procedure: RIGHT HEART CATH;  Surgeon: Jolaine Artist, MD;  Location: [facility];  Service: Cardiovascular;  Laterality: N/A;    Current Outpatient Medications  Medication Sig Dispense Refill  . apixaban (ELIQUIS) 5 MG TABS tablet Take 1 tablet (5 mg total) by mouth 2 (two) times daily. 60 tablet 3  . atorvastatin (LIPITOR) 40 MG tablet Take 1 tablet (40 mg total) by mouth daily. 90 tablet 0  . calcitRIOL (ROCALTROL) 0.25 MCG capsule Take 1 capsule (0.25 mcg total) by mouth daily. 30 capsule 3  . CALCIUM PO Take 600 mg by mouth daily. Vitamin D3    . Ferrous Sulfate (IRON PO) Take 65 mg by mouth daily.     . furosemide (LASIX) 40 MG tablet Take 2 tablets (80 mg total) by mouth 2 (two) times daily. 120 tablet 3  . hydrALAZINE (APRESOLINE) 10 MG tablet Take 1 tablet (10 mg total) by mouth 2 (two) times daily. 60 tablet 4  . isosorbide mononitrate (IMDUR) 30 MG 24 hr tablet TAKE 1 TABLET(30 MG) BY MOUTH DAILY 90 tablet 0  . loperamide (IMODIUM A-D) 2 MG tablet Take 1 tablet (2 mg total) by mouth 4 (four) times daily as needed for diarrhea or loose stools. 30 tablet 0  . metolazone (ZAROXOLYN) 2.5 MG tablet TAKE 1 TABLET BY MOUTH DAILY ON SATURDAY AND SUNDAY TO GET WEIGHT DOWN 5-6 LBS 25 tablet 1  . metoprolol succinate (TOPROL-XL) 50 MG 24 hr tablet Take 1 tablet (50 mg total) by mouth daily. Take with or immediately following a meal. 30 tablet 5   No current facility-administered medications for this encounter.   Allergies:  Patient  has no known allergies.   Social History: The patient  reports that he has never smoked. He has never used smokeless tobacco. He reports current alcohol use. He reports that he does not use drugs.   Family History: The patient's family history includes Asthma in his mother.   ROS:  Please see the history of present illness. Otherwise, complete review of systems is positive for fatigue, dyspnea on exertion.  All other systems are reviewed and negative.   Physical Exam: VS:  BP (!) 150/90   Pulse 62   Wt 86.2 kg (190 lb)   SpO2 93%   BMI 25.77 kg/m , BMI Body mass index is 25.77 kg/m.  Wt Readings from Last 3 Encounters:  08/07/19 86.2  kg (190 lb)  06/25/19 82.1 kg (181 lb)  06/19/19 90.7 kg (200 lb)    General:  Chronically ill appearing elderly WM in WC but able to ambulate  No resp difficulty HEENT: normal Neck: supple. JVP 8-9Carotids 2+ bilat; no bruits. No lymphadenopathy or thryomegaly appreciated. Cor: PMI nondisplaced. irregular rate & rhythm. No rubs, gallops or murmurs. Lungs: clear Abdomen: obese soft, nontender, nondistended. No hepatosplenomegaly. No bruits or masses. Good bowel sounds. Extremities: no cyanosis, clubbing, rash, 1+R and 2+ L edema with severe scale and chronic venous stasis changes.  Neuro: alert & orientedx3, cranial nerves grossly intact. moves all 4 extremities w/o difficulty. Affect pleasant   Recent Labwork: 01/28/2019: Magnesium 2.4 04/26/2019: B Natriuretic Peptide >4,500.0; Platelets 91 05/02/2019: Hemoglobin 12.2 05/09/2019: TSH 6.46 06/19/2019: ALT 14; AST 22 06/25/2019: BUN 81; Creatinine, Ser 4.84; Potassium 2.5; Sodium 143   Other Studies Reviewed Today:  Echocardiogram 01/10/2019:  1. The left ventricle has severely reduced systolic function, with an ejection fraction of 25-30%. The cavity size was mildly dilated. Left ventricular diastolic Doppler parameters are indeterminate.  2. The right ventricle has mildly reduced systolic function.  The cavity was moderately enlarged. There is no increase in right ventricular wall thickness.  3. Left atrial size was severely dilated.  4. Right atrial size was severely dilated.  5. No evidence of mitral valve stenosis.  6. Tricuspid valve regurgitation is moderate.  7. The aortic valve is tricuspid. No stenosis of the aortic valve.  8. The aorta is normal unless otherwise noted.  9. The aortic root is normal in size and structure. 10. Pulmonary hypertension is moderately elevated, PASP is 63 mmHg.  Assessment and Plan:  1.  Chronic systolic HF with biventricular dysfunction - unclear etiology. I suspect he has a restrictive CM due to longstanding uncontrolled HTN and probable OSA/OHS - echo 8/20 with LVEF 25-30% w/ mildly reduced RV fx and mod-severe PH - He denies any h/o left heart cath and is currently not a candidate for coronary angiography (or cMRI) with CKD;  - RHC 12/20 showed moderately elevated biventricular pressures with low cardiac output with restrictive cardiomyopathy. Options felt to be very limited w/ CKD and very poor insight into his disease process and self management  - NYHA III with R>L symptoms - Creatinine was 4.8 in 2/21 but now back to 3.3 after stopping metolazone - I think his volume status is reasonable controlled and with CKD 4 would not push diuretics further  - Continue lasix 80 bid for now - Continue hydralazine 10 tid and Imdur 30 daily - Cut Toprol 50 daily  - Stop Entresto with progressive CKD - Not candidate for SGLT2i with CKD4 - TTR amyloid a consideration but making diagnosis at this point would not change our management Unable to get cMRi given CKD. Myeloma panel negatve. PYP scan has been ordered but not done yet.  - I had long talk with him with Dr. Royce Macadamia on the phone. I suspect he may progress to ESRD in next 6-12 months but I am concerned that he does not have the insight or compliance to do well with HD. We often have trouble getting in  touch with him and when I ask him if he would do HD as a matter of life and death. He said. 'Well, that's a hard one I would have to think about it and also think about the cost."  - he will see Dr. Royce Macadamia On Monday and I told him that if  he was serious about considering HD that he should bring his wife to help with the discussion or else we probably would not offer to him.   2. 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  - RHC 12/20 w/ PA = 69/30 (42) - PSG on 04/04/19 AHI 56 with 27% CSA. This is being followed by Dr. Radford Pax - PFTs pending   3. CKD 4 - likely due to longstanding HTN - recent creatinine 3.95 -> 4.8 -> 3.36 - Follows with Dr. Royce Macadamia. See discussion above  4.  Permanent atrial fibrillation.   - in setting of severe biatrial enlargement with restrictive CM  - rate controlled w/ metoprolol. With low output will decrease Toprol to 50 daily - continue Eliquis. No bleeding  5.  Chronic lymphedema. - continue compression hose.  6. Morbid obesity - s/p previous gastric bypass - Body mass index is 25.77 kg/m.   7. Severe OSA/CSA - very complex SA. Likely related to obesity and advanced HF - following with Dr. Radford Pax for CPAP titration  Total time spent 45 minutes. Over half that time spent discussing above.     Glori Bickers, MD  12:50 PM

## 2019-08-22 ENCOUNTER — Other Ambulatory Visit: Payer: Self-pay | Admitting: *Deleted

## 2019-08-22 DIAGNOSIS — N184 Chronic kidney disease, stage 4 (severe): Secondary | ICD-10-CM

## 2019-08-26 ENCOUNTER — Telehealth (INDEPENDENT_AMBULATORY_CARE_PROVIDER_SITE_OTHER): Payer: Self-pay

## 2019-08-26 ENCOUNTER — Telehealth (HOSPITAL_COMMUNITY): Payer: Self-pay

## 2019-08-26 NOTE — Telephone Encounter (Signed)

## 2019-08-26 NOTE — Telephone Encounter (Signed)
update of policy change. Pt now as of 08/22/19 has [employer] . ID : [id number] , [id number]. He is worried his procedure will not be covered under his new policy. Will call [facility] offie to assist with new prior auth.

## 2019-08-27 ENCOUNTER — Other Ambulatory Visit: Payer: Self-pay

## 2019-08-27 ENCOUNTER — Other Ambulatory Visit: Payer: Self-pay | Admitting: Nephrology

## 2019-08-27 ENCOUNTER — Ambulatory Visit (HOSPITAL_COMMUNITY)
Admission: RE | Admit: 2019-08-27 | Discharge: 2019-08-27 | Disposition: A | Discharge status: Home / Self Care | Payer: Medicare HMO | Source: Ambulatory Visit | Attending: Vascular Surgery | Admitting: Vascular Surgery

## 2019-08-27 ENCOUNTER — Ambulatory Visit (INDEPENDENT_AMBULATORY_CARE_PROVIDER_SITE_OTHER): Payer: Medicare HMO | Admitting: Vascular Surgery

## 2019-08-27 ENCOUNTER — Ambulatory Visit (INDEPENDENT_AMBULATORY_CARE_PROVIDER_SITE_OTHER)
Admission: RE | Admit: 2019-08-27 | Discharge: 2019-08-27 | Disposition: A | Discharge status: Home / Self Care | Payer: Medicare HMO | Source: Ambulatory Visit | Attending: Vascular Surgery | Admitting: Vascular Surgery

## 2019-08-27 ENCOUNTER — Encounter: Payer: Self-pay | Admitting: Vascular Surgery

## 2019-08-27 VITALS — BP 140/90 | HR 80 | Resp 20 | Ht 72.0 in | Wt 190.0 lb

## 2019-08-27 DIAGNOSIS — N184 Chronic kidney disease, stage 4 (severe): Secondary | ICD-10-CM | POA: Insufficient documentation

## 2019-08-27 DIAGNOSIS — Q6102 Congenital multiple renal cysts: Secondary | ICD-10-CM

## 2019-08-27 NOTE — Progress Notes (Signed)
V[facility] and Vein Specialist of [place]  Patient name: Lawrence Bonilla MRN: [id number] DOB: 1950-09-06 Sex: male  REASON FOR CONSULT: Evaluate for access for hemodialysis  HPI: Lawrence Bonilla is a 69 y.o. male, who is here today for discussion of hemodialysis access options.  He is not on hemodialysis and is not felt to be an imminent need for this.  He is class to stage IV.  He is right-handed.  He does not have a pacemaker.  He is not on anticoagulant  Past Medical History:  Diagnosis Date  . Atrial fibrillation ([place])   . CKD (chronic kidney disease) stage 3, GFR 30-59 ml/min   . Congestive heart failure (CHF) ([place])   . DM type 2 (diabetes mellitus, type 2) ([place])   . Essential hypertension   . Hyperlipidemia   . Hypothyroidism   . Lymphedema   . OSA (obstructive sleep apnea)   . PVD (peripheral vascular disease) ([place])   . Secondary cardiomyopathy ([place])   . Vitamin D deficiency disease     Family History  Problem Relation Age of Onset  . Asthma Mother     SOCIAL HISTORY: Social History   Socioeconomic History  . Marital status: Married    Spouse name: Not on file  . Number of children: Not on file  . Years of education: Not on file  . Highest education level: Not on file  Occupational History  . Not on file  Tobacco Use  . Smoking status: Never Smoker  . Smokeless tobacco: Never Used  Substance and Sexual Activity  . Alcohol use: Yes    Comment: 2 SCOTCH   . Drug use: Never  . Sexual activity: Yes    Partners: Female    Comment: MARRIED FOR 40 YEARS   Other Topics Concern  . 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   Social Determinants of Health   Financial Resource Strain:   . Difficulty of Paying Living Expenses:   Food Insecurity:   . Worried About Charity fundraiser in the Last Year:   . Academic librarian in the Last Year:   Transportation Needs:   . Film/video editor (Medical):   Marland Kitchen Lack of Transportation (Non-Medical):   Physical Activity:   . Days of Exercise per Week:   . Minutes of Exercise per Session:   Stress:   . Feeling of Stress :   Social Connections:   . Frequency of Communication with Friends and Family:   . Frequency of Social Gatherings with Friends and Family:   . Attends Religious Services:   . Active Member of Clubs or Organizations:   . Attends Archivist Meetings:   Marland Kitchen Marital Status:   Intimate Partner Violence:   . Fear of Current or Ex-Partner:   . Emotionally Abused:   Marland Kitchen Physically Abused:   . Sexually Abused:     No Known Allergies  Current Outpatient Medications  Medication Sig Dispense Refill  . apixaban (ELIQUIS) 5 MG TABS tablet Take 1 tablet (5 mg total) by mouth 2 (two) times daily. 60 tablet 3  . atorvastatin (LIPITOR) 40 MG tablet Take 1 tablet (40 mg total) by mouth daily. 90 tablet 0  . calcitRIOL (ROCALTROL) 0.25 MCG capsule Take 1 capsule (0.25 mcg total) by mouth daily. 30 capsule 3  . CALCIUM PO Take 600 mg by mouth daily. Vitamin  D3    . Ferrous Sulfate (IRON PO) Take 65 mg by mouth daily.     . furosemide (LASIX) 40 MG tablet Take 2 tablets (80 mg total) by mouth 2 (two) times daily. 120 tablet 3  . hydrALAZINE (APRESOLINE) 10 MG tablet Take 1 tablet (10 mg total) by mouth 2 (two) times daily. 60 tablet 4  . isosorbide mononitrate (IMDUR) 30 MG 24 hr tablet TAKE 1 TABLET(30 MG) BY MOUTH DAILY 90 tablet 0  . loperamide (IMODIUM A-D) 2 MG tablet Take 1 tablet (2 mg total) by mouth 4 (four) times daily as needed for diarrhea or loose stools. 30 tablet 0  . metolazone (ZAROXOLYN) 2.5 MG tablet TAKE 1 TABLET BY MOUTH DAILY ON SATURDAY AND SUNDAY TO GET WEIGHT DOWN 5-6 LBS 25 tablet 1  . metoprolol succinate (TOPROL-XL) 50 MG 24 hr tablet Take 1 tablet (50 mg total) by mouth daily. Take with or immediately following a meal. 30  tablet 5   No current facility-administered medications for this visit.    REVIEW OF SYSTEMS:  [X]  denotes positive finding, [ ]  denotes negative finding Cardiac  Comments:  Chest pain or chest pressure:    Shortness of breath upon exertion:    Short of breath when lying flat:    Irregular heart rhythm:        Vascular    Pain in calf, thigh, or hip brought on by ambulation:    Pain in feet at night that wakes you up from your sleep:     Blood clot in your veins:    Leg swelling:         Pulmonary    Oxygen at home:    Productive cough:     Wheezing:         Neurologic    Sudden weakness in arms or legs:     Sudden numbness in arms or legs:     Sudden onset of difficulty speaking or slurred speech:    Temporary loss of vision in one eye:     Problems with dizziness:         Gastrointestinal    Blood in stool:     Vomited blood:         Genitourinary    Burning when urinating:     Blood in urine:        Psychiatric    Major depression:         Hematologic    Bleeding problems:    Problems with blood clotting too easily:        Skin    Rashes or ulcers:        Constitutional    Fever or chills:      PHYSICAL EXAM: Vitals:   08/27/19 0835  BP: 140/90  Pulse: 80  Resp: 20  SpO2: 93%  Weight: 190 lb (86.2 kg)  Height: 6' (1.829 m)    GENERAL: The patient is a well-nourished male, in no acute distress. The vital signs are documented above. CARDIOVASCULAR: Palpable brachial and radial pulses bilaterally.  Extremely small surface veins bilaterally PULMONARY: There is good air exchange  ABDOMEN: Soft and non-tender  MUSCULOSKELETAL: There are no major deformities or cyanosis. NEUROLOGIC: No focal weakness or paresthesias are detected. SKIN: There are no ulcers or rashes noted. PSYCHIATRIC: The patient has a normal affect.  DATA:  Upper extremity studies revealed normal arterial flow.  His venous studies reveal extremely small cephalic and basilic veins  bilaterally  MEDICAL  ISSUES: Had long discussion with the patient regarding access for hemodialysis.  I discussed the options of tunneled catheter, AV fistula and AV graft.  He is not a candidate for fistula creation due to extremely small surface veins bilaterally.  I would recommend a left arm AV Gore-Tex graft as his initial access option.  Agree to reserve this until he is in more eminent need for hemodialysis.  We are available to proceed with this as indicated if his renal function deteriorates   Rosetta Posner, MD [facility] and Ve[facility] Specialists of [facility] Tel [phone] Pager [phone]

## 2019-09-02 ENCOUNTER — Inpatient Hospital Stay: Admission: RE | Admit: 2019-09-02 | Payer: Medicare HMO | Source: Ambulatory Visit

## 2019-09-03 ENCOUNTER — Other Ambulatory Visit (INDEPENDENT_AMBULATORY_CARE_PROVIDER_SITE_OTHER): Payer: Self-pay

## 2019-09-03 MED ORDER — APIXABAN 5 MG PO TABS: 5.0000 mg | ORAL_TABLET | Freq: Two times a day (BID) | ORAL | 3 refills | Status: DC

## 2019-09-03 MED ORDER — FUROSEMIDE 40 MG PO TABS: 80.0000 mg | ORAL_TABLET | Freq: Two times a day (BID) | ORAL | 3 refills | Status: DC

## 2019-09-03 NOTE — Telephone Encounter (Signed)
Pt lvm that he needs a refill on  Eliquis 5 mg & Foursemide 40

## 2019-09-06 ENCOUNTER — Ambulatory Visit
Admission: RE | Admit: 2019-09-06 | Discharge: 2019-09-06 | Disposition: A | Discharge status: Home / Self Care | Payer: Medicare HMO | Source: Ambulatory Visit | Attending: Nephrology | Admitting: Nephrology

## 2019-09-06 DIAGNOSIS — Q6102 Congenital multiple renal cysts: Secondary | ICD-10-CM

## 2019-09-06 DIAGNOSIS — N281 Cyst of kidney, acquired: Secondary | ICD-10-CM | POA: Diagnosis not present

## 2019-09-09 ENCOUNTER — Other Ambulatory Visit: Payer: Self-pay

## 2019-09-09 ENCOUNTER — Ambulatory Visit (INDEPENDENT_AMBULATORY_CARE_PROVIDER_SITE_OTHER): Payer: Medicare HMO | Admitting: Internal Medicine

## 2019-09-09 ENCOUNTER — Encounter (INDEPENDENT_AMBULATORY_CARE_PROVIDER_SITE_OTHER): Payer: Self-pay | Admitting: Internal Medicine

## 2019-09-09 VITALS — BP 130/70 | HR 61 | Temp 97.7°F | Ht 72.0 in | Wt 174.4 lb

## 2019-09-09 DIAGNOSIS — N184 Chronic kidney disease, stage 4 (severe): Secondary | ICD-10-CM

## 2019-09-09 DIAGNOSIS — I10 Essential (primary) hypertension: Secondary | ICD-10-CM | POA: Diagnosis not present

## 2019-09-09 DIAGNOSIS — E1122 Type 2 diabetes mellitus with diabetic chronic kidney disease: Secondary | ICD-10-CM | POA: Diagnosis not present

## 2019-09-09 DIAGNOSIS — E039 Hypothyroidism, unspecified: Secondary | ICD-10-CM | POA: Diagnosis not present

## 2019-09-09 DIAGNOSIS — I5023 Acute on chronic systolic (congestive) heart failure: Secondary | ICD-10-CM

## 2019-09-09 NOTE — Progress Notes (Signed)
Metrics: Intervention Frequency ACO  Documented Smoking Status Yearly  Screened one or more times in 24 months  Cessation Counseling or  Active cessation medication Past 24 months  Past 24 months   Guideline developer: UpToDate (See UpToDate for funding source) Date Released: 2014       Wellness Office Visit  Subjective:  Patient ID: Lawrence Bonilla, male    DOB: January 29, 1951  Age: 69 y.o. MRN: [id number]  CC: This man comes in for follow-up of diabetes, hypertension, congestive heart failure. HPI  He is being followed by to cardiologist at the present time we are trying to fine-tune his congestive heart failure. He also has a history of hypothyroidism and for some reason he is not taking any thyroid medication at all.  I am not sure why this is. He has chronic kidney disease and is considering dialysis and he will think about it.  He is due to follow-up with cardiology soon as well as nephrology. Past Medical History:  Diagnosis Date  . Atrial fibrillation ([place])   . CKD (chronic kidney disease) stage 3, GFR 30-59 ml/min   . Congestive heart failure (CHF) ([place])   . DM type 2 (diabetes mellitus, type 2) ([place])   . Essential hypertension   . Hyperlipidemia   . Hypothyroidism   . Lymphedema   . OSA (obstructive sleep apnea)   . PVD (peripheral vascular disease) ([place])   . Secondary cardiomyopathy ([place])   . Vitamin D deficiency disease       Family History  Problem Relation Age of Onset  . 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   Social History   Tobacco Use  . Smoking status: Never Smoker  . Smokeless tobacco: Never Used  Substance Use Topics  . Alcohol use: Yes    Comment: 2 SCOTCH     Current Meds  Medication Sig  . apixaban (ELIQUIS) 5 MG TABS tablet Take 1 tablet (5 mg total) by mouth 2 (two) times daily.  Marland Kitchen atorvastatin (LIPITOR)  40 MG tablet Take 1 tablet (40 mg total) by mouth daily.  . calcitRIOL (ROCALTROL) 0.25 MCG capsule Take 1 capsule (0.25 mcg total) by mouth daily. (Patient taking differently: Take 0.5 mcg by mouth daily. )  . CALCIUM PO Take 600 mg by mouth daily. Vitamin D3  . Ferrous Sulfate (IRON PO) Take 65 mg by mouth daily.   . furosemide (LASIX) 40 MG tablet Take 2 tablets (80 mg total) by mouth 2 (two) times daily.  . hydrALAZINE (APRESOLINE) 10 MG tablet Take 1 tablet (10 mg total) by mouth 2 (two) times daily.  . isosorbide mononitrate (IMDUR) 30 MG 24 hr tablet TAKE 1 TABLET(30 MG) BY MOUTH DAILY  . loperamide (IMODIUM A-D) 2 MG tablet Take 1 tablet (2 mg total) by mouth 4 (four) times daily as needed for diarrhea or loose stools.  . metolazone (ZAROXOLYN) 2.5 MG tablet TAKE 1 TABLET BY MOUTH DAILY ON SATURDAY AND SUNDAY TO GET WEIGHT DOWN 5-6 LBS  . metoprolol succinate (TOPROL-XL) 50 MG 24 hr tablet Take 1 tablet (50 mg total) by mouth daily. Take with or immediately following a meal.      Objective:   Today's Vitals: BP 130/70 (BP Location: Left Arm, Patient Position: Sitting, Cuff Size: Normal)   Pulse 61   Temp 97.7 F (36.5 C) (Temporal)   Ht 6' (1.829  m)   Wt 174 lb 6.4 oz (79.1 kg)   SpO2 (!) 89%   BMI 23.65 kg/m  Vitals with BMI 09/09/2019 08/27/2019 08/07/2019  Height 6\' 0"  6\' 0"  -  Weight 174 lbs 6 oz 190 lbs 190 lbs  BMI 09.23 30.07 -  Systolic 622 633 354  Diastolic 70 90 90  Pulse 61 80 62     Physical Exam  He looks frail and he has lost 16 pounds in the matter of almost 2 weeks.  Blood pressure is well controlled.  Lung fields are actually clinically clear.  He is alert and orientated.     Assessment   1. Acute on chronic systolic CHF (congestive heart failure) ([place])   2. Hypertension, unspecified type   3. Type 2 diabetes mellitus with stage 4 chronic kidney disease, without long-term current use of insulin ([place])   4. Hypothyroidism, adult       Tests  ordered Orders Placed This Encounter  Procedures  . COMPLETE METABOLIC PANEL WITH GFR  . Hemoglobin A1c  . T3, free  . T4  . TSH     Plan: 1. Blood work is ordered above. 2. He will continue with current therapy for his hypertension which seems to be controlling it well. 3. We will check his A1c for his diabetes.  His last A1c in December was excellent at 5.5%. 4. We will check thyroid function also to see if he actually does require thyroid medications. 5. He will follow up with cardiology and nephrology and I will see him in about 5 months time for follow-up.   No orders of the defined types were placed in this encounter.   Doree Albee, MD

## 2019-09-10 ENCOUNTER — Other Ambulatory Visit (INDEPENDENT_AMBULATORY_CARE_PROVIDER_SITE_OTHER): Payer: Self-pay | Admitting: Internal Medicine

## 2019-09-10 LAB — COMPLETE METABOLIC PANEL WITH GFR
AG Ratio: 1.6 (calc) (ref 1.0–2.5)
ALT: 14 U/L (ref 9–46)
AST: 22 U/L (ref 10–35)
Albumin: 4.2 g/dL (ref 3.6–5.1)
Alkaline phosphatase (APISO): 82 U/L (ref 35–144)
BUN/Creatinine Ratio: 21 (calc) (ref 6–22)
BUN: 89 mg/dL — ABNORMAL HIGH (ref 7–25)
CO2: 36 mmol/L — ABNORMAL HIGH (ref 20–32)
Calcium: 9.5 mg/dL (ref 8.6–10.3)
Chloride: 93 mmol/L — ABNORMAL LOW (ref 98–110)
Creat: 4.29 mg/dL — ABNORMAL HIGH (ref 0.70–1.25)
GFR, Est African American: 15 mL/min/{1.73_m2} — ABNORMAL LOW (ref >=60)
GFR, Est Non African American: 13 mL/min/{1.73_m2} — ABNORMAL LOW (ref >=60)
Globulin: 2.7 g/dL (calc) (ref 1.9–3.7)
Glucose, Bld: 129 mg/dL — ABNORMAL HIGH (ref 65–99)
Potassium: 4.1 mmol/L (ref 3.5–5.3)
Sodium: 142 mmol/L (ref 135–146)
Total Bilirubin: 1 mg/dL (ref 0.2–1.2)
Total Protein: 6.9 g/dL (ref 6.1–8.1)

## 2019-09-10 LAB — TSH: TSH: 61.65 mIU/L — ABNORMAL HIGH (ref 0.40–4.50)

## 2019-09-10 LAB — T4: T4, Total: 3.3 ug/dL — ABNORMAL LOW (ref 4.9–10.5)

## 2019-09-10 LAB — HEMOGLOBIN A1C
Hgb A1c MFr Bld: 5.2 % of total Hgb (ref <5.7)
Mean Plasma Glucose: 103 (calc)
eAG (mmol/L): 5.7 (calc)

## 2019-09-10 LAB — T3, FREE: T3, Free: 2.1 pg/mL — ABNORMAL LOW (ref 2.3–4.2)

## 2019-09-10 MED ORDER — THYROID 60 MG PO TABS: 60.0000 mg | ORAL_TABLET | Freq: Every day | ORAL | 3 refills | Status: DC

## 2019-09-13 ENCOUNTER — Other Ambulatory Visit: Payer: Self-pay

## 2019-09-13 ENCOUNTER — Encounter: Payer: Self-pay | Admitting: Cardiology

## 2019-09-13 ENCOUNTER — Ambulatory Visit: Payer: Medicare HMO | Admitting: Cardiology

## 2019-09-13 VITALS — BP 122/74 | HR 65 | Ht 72.0 in | Wt 175.0 lb

## 2019-09-13 DIAGNOSIS — I4821 Permanent atrial fibrillation: Secondary | ICD-10-CM | POA: Diagnosis not present

## 2019-09-13 DIAGNOSIS — N185 Chronic kidney disease, stage 5: Secondary | ICD-10-CM | POA: Diagnosis not present

## 2019-09-13 DIAGNOSIS — I5042 Chronic combined systolic (congestive) and diastolic (congestive) heart failure: Secondary | ICD-10-CM

## 2019-09-13 NOTE — Progress Notes (Signed)
Cardiology Office Note  Date: 09/13/2019   ID: Bland, Rudzinski Jun 08, 1950, MRN [id number]  PCP:  Ailene Ards, NP  Cardiologist:  Rozann Lesches, MD Electrophysiologist:  None   Chief Complaint  Patient presents with  . Cardiac follow-up    History of Present Illness: Lawrence Bonilla is a 69 y.o. male last seen in January.  Interval follow-up noted in the heart failure clinic in March, I reviewed the note. He presents today for routine visit.  Actually, his weight is down about 10 pounds, he states that he has been moving about a little bit better, still limited by chronic leg pain and uses a cane.  He has NYHA class III dyspnea, no chest pain or palpitations.  Denies any syncope.  I reviewed his recent lab work obtained by PCP and outlined below.  Creatinine 4.29, he continues to follow with nephrology, recently had vein mapping in anticipation of dialysis access.  TSH also found to be 61.65 and has been started back on thyroid replacement by Dr. Anastasio Champion.  We went over his medications today, somewhat difficult to discern how he has been taking them, but it sounds like he has actually been taking metolazone every day at least for the last month.  This could explain his weight loss.  Otherwise he seems to be tolerating the lower dose of Toprol-XL and his heart rate is controlled today.  Past Medical History:  Diagnosis Date  . Atrial fibrillation ([place])   . CKD (chronic kidney disease) stage 3, GFR 30-59 ml/min   . Congestive heart failure (CHF) ([place])   . DM type 2 (diabetes mellitus, type 2) ([place])   . Essential hypertension   . Hyperlipidemia   . Hypothyroidism   . Lymphedema   . OSA (obstructive sleep apnea)   . PVD (peripheral vascular disease) ([place])   . Secondary cardiomyopathy ([place])   . Vitamin D deficiency disease     Past Surgical History:  Procedure Laterality Date  . APPENDECTOMY    . BARIATRIC SURGERY    . RIGHT HEART CATH N/A 05/02/2019   Procedure:  RIGHT HEART CATH;  Surgeon: Jolaine Artist, MD;  Location: [facility];  Service: Cardiovascular;  Laterality: N/A;    Current Outpatient Medications  Medication Sig Dispense Refill  . apixaban (ELIQUIS) 5 MG TABS tablet Take 1 tablet (5 mg total) by mouth 2 (two) times daily. 60 tablet 3  . atorvastatin (LIPITOR) 40 MG tablet Take 1 tablet (40 mg total) by mouth daily. 90 tablet 0  . calcitRIOL (ROCALTROL) 0.25 MCG capsule Take 1 capsule (0.25 mcg total) by mouth daily. (Patient taking differently: Take 0.5 mcg by mouth daily. ) 30 capsule 3  . CALCIUM PO Take 600 mg by mouth daily. Vitamin D3    . Ferrous Sulfate (IRON PO) Take 65 mg by mouth daily.     . furosemide (LASIX) 40 MG tablet Take 2 tablets (80 mg total) by mouth 2 (two) times daily. 120 tablet 3  . hydrALAZINE (APRESOLINE) 10 MG tablet Take 1 tablet (10 mg total) by mouth 2 (two) times daily. 60 tablet 4  . isosorbide mononitrate (IMDUR) 30 MG 24 hr tablet TAKE 1 TABLET(30 MG) BY MOUTH DAILY 90 tablet 0  . loperamide (IMODIUM A-D) 2 MG tablet Take 1 tablet (2 mg total) by mouth 4 (four) times daily as needed for diarrhea or loose stools. 30 tablet 0  . metolazone (ZAROXOLYN) 2.5 MG tablet TAKE 1 TABLET  BY MOUTH DAILY ON SATURDAY AND SUNDAY TO GET WEIGHT DOWN 5-6 LBS 25 tablet 1  . metoprolol succinate (TOPROL-XL) 50 MG 24 hr tablet Take 1 tablet (50 mg total) by mouth daily. Take with or immediately following a meal. 30 tablet 5   No current facility-administered medications for this visit.   Allergies:  Patient has no known allergies.   ROS:   No spontaneous bleeding problems.  Physical Exam: VS:  BP 122/74   Pulse 65   Ht 6' (1.829 m)   Wt 175 lb (79.4 kg)   SpO2 92%   BMI 23.73 kg/m , BMI Body mass index is 23.73 kg/m.  Wt Readings from Last 3 Encounters:  09/13/19 175 lb (79.4 kg)  09/09/19 174 lb 6.4 oz (79.1 kg)  08/27/19 190 lb (86.2 kg)    General: Chronically ill-appearing male in no  distress. HEENT: Conjunctiva and lids normal, wearing a mask. Neck: Supple, no elevated JVP or carotid bruits, no thyromegaly. Lungs: Decreased at right base, no wheezing, nonlabored breathing at rest. Cardiac: Irregularly irregular, no S3 or significant systolic murmur. Abdomen: Soft, bowel sounds present. Extremities: Chronic appearing lymphedema less than on prior exam.  ECG:  An ECG dated 05/02/2019 was personally reviewed today and demonstrated:  Rate controlled atrial fibrillation with low voltage in the limb leads, nonspecific ST-T changes.  Recent Labwork: 01/28/2019: Magnesium 2.4 04/26/2019: B Natriuretic Peptide >4,500.0; Platelets 91 05/02/2019: Hemoglobin 12.2 09/09/2019: ALT 14; AST 22; BUN 89; Creat 4.29; Potassium 4.1; Sodium 142; TSH 61.65   Other Studies Reviewed Today:  Right heart catheterization 05/02/2019: Findings:  RA = 18 RV = 66/14 PA = 69/30 (42) PCW = 24 Fick cardiac output/index = 4.1/1.9 Thermo CO/CI = 4.0/1.8 PVR = 4.5 WU Ao sat = 99% PA sat = 55%, 56%  Echocardiogram 01/10/2019: 1. The left ventricle has severely reduced systolic function, with an  ejection fraction of 25-30%. The cavity size was mildly dilated. Left  ventricular diastolic Doppler parameters are indeterminate.  2. The right ventricle has mildly reduced systolic function. The cavity  was moderately enlarged. There is no increase in right ventricular wall  thickness.  3. Left atrial size was severely dilated.  4. Right atrial size was severely dilated.  5. No evidence of mitral valve stenosis.  6. Tricuspid valve regurgitation is moderate.  7. The aortic valve is tricuspid. No stenosis of the aortic valve.  8. The aorta is normal unless otherwise noted.  9. The aortic root is normal in size and structure.  10. Pulmonary hypertension is moderately elevated, PASP is 63 mmHg.   Assessment and Plan:  1.  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  He continues to follow in the advanced heart failure clinic with next visit in mid June.  Fluid status improved and it sounds like he may have actually been taking metolazone daily although with his renal function this is not optimal.  For now I have recommended that he continue present dose of Lasix 80 mg twice daily, cut metolazone back to Saturday and Sunday which she had done in the past (might even be able to use this as needed if he can weigh himself and follow through with dose adjustments based on weight change), continue Toprol-XL 50 mg daily, and continue low-dose hydralazine with Imdur.  2.  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  He continues to follow-up with nephrology, underwent recent vein mapping in anticipation of hemodialysis access.  It does  not sound like a firm decision has been made yet regarding hemodialysis, he himself seems to be uncertain.  3.  Chronic hypoxic respiratory failure with severe OSA/OHS.  Continue supplemental oxygen.  4.  Permanent atrial fibrillation, heart rate control remains adequate following reduction and Toprol-XL dosing.  Continue Eliquis for stroke prophylaxis.  5.  Chronic lymphedema.  Medication Adjustments/Labs and Tests Ordered: Current medicines are reviewed at length with the patient today.  Concerns regarding medicines are outlined above.   Tests Ordered: No orders of the defined types were placed in this encounter.   Medication Changes: No orders of the defined types were placed in this encounter.   Disposition:  Follow up 3 months in the [place] office.  Signed, [name], MD, [facility] 09/13/2019 1:28 PM    [facility] [place], [place], [place] [address] Phone: [phone]; Fax: [phone]

## 2019-09-13 NOTE — Patient Instructions (Addendum)
Medication Instructions:   Your physician has recommended you make the following change in your medication:   Please resume taking metolazone 2.5 mg one tablet on Saturday and Sunday only.  Continue other medications the same  Labwork:  NONE  Testing/Procedures:  NONE  Follow-Up:  Your physician recommends that you schedule a follow-up appointment in: 3 months (office).  Any Other Special Instructions Will Be Listed Below (If Applicable).  If you need a refill on your cardiac medications before your next appointment, please call your pharmacy.

## 2019-09-19 DIAGNOSIS — G4733 Obstructive sleep apnea (adult) (pediatric): Secondary | ICD-10-CM | POA: Diagnosis not present

## 2019-09-25 ENCOUNTER — Telehealth (INDEPENDENT_AMBULATORY_CARE_PROVIDER_SITE_OTHER): Payer: Self-pay

## 2019-09-25 MED ORDER — ISOSORBIDE MONONITRATE ER 30 MG PO TB24: ORAL_TABLET | ORAL | 0 refills | Status: DC

## 2019-09-25 NOTE — Telephone Encounter (Signed)
LeighAnn Mykel Sponaugle, CMA  

## 2019-09-30 ENCOUNTER — Other Ambulatory Visit: Payer: Self-pay

## 2019-09-30 ENCOUNTER — Encounter (HOSPITAL_COMMUNITY): Payer: Self-pay | Admitting: *Deleted

## 2019-09-30 ENCOUNTER — Emergency Department (HOSPITAL_COMMUNITY)
Admission: EM | Admit: 2019-09-30 | Discharge: 2019-09-30 | Disposition: A | Discharge status: Home / Self Care | Payer: Medicare HMO | Attending: Emergency Medicine | Admitting: Emergency Medicine

## 2019-09-30 DIAGNOSIS — X58XXXA Exposure to other specified factors, initial encounter: Secondary | ICD-10-CM | POA: Insufficient documentation

## 2019-09-30 DIAGNOSIS — I130 Hypertensive heart and chronic kidney disease with heart failure and stage 1 through stage 4 chronic kidney disease, or unspecified chronic kidney disease: Secondary | ICD-10-CM | POA: Diagnosis not present

## 2019-09-30 DIAGNOSIS — Z79899 Other long term (current) drug therapy: Secondary | ICD-10-CM | POA: Insufficient documentation

## 2019-09-30 DIAGNOSIS — I509 Heart failure, unspecified: Secondary | ICD-10-CM | POA: Insufficient documentation

## 2019-09-30 DIAGNOSIS — S81802A Unspecified open wound, left lower leg, initial encounter: Secondary | ICD-10-CM | POA: Insufficient documentation

## 2019-09-30 DIAGNOSIS — Y999 Unspecified external cause status: Secondary | ICD-10-CM | POA: Insufficient documentation

## 2019-09-30 DIAGNOSIS — Y929 Unspecified place or not applicable: Secondary | ICD-10-CM | POA: Diagnosis not present

## 2019-09-30 DIAGNOSIS — Z7901 Long term (current) use of anticoagulants: Secondary | ICD-10-CM | POA: Insufficient documentation

## 2019-09-30 DIAGNOSIS — N183 Chronic kidney disease, stage 3 unspecified: Secondary | ICD-10-CM | POA: Diagnosis not present

## 2019-09-30 DIAGNOSIS — Y939 Activity, unspecified: Secondary | ICD-10-CM | POA: Diagnosis not present

## 2019-09-30 DIAGNOSIS — E1122 Type 2 diabetes mellitus with diabetic chronic kidney disease: Secondary | ICD-10-CM | POA: Insufficient documentation

## 2019-09-30 MED ORDER — DOXYCYCLINE HYCLATE 100 MG PO CAPS: 100.0000 mg | ORAL_CAPSULE | Freq: Two times a day (BID) | ORAL | 0 refills | Status: AC

## 2019-09-30 NOTE — ED Provider Notes (Signed)
[facility] EMERGENCY DEPARTMENT Provider Note   CSN: [id number] Arrival date & time: 09/30/19  1041     History Chief Complaint  Patient presents with  . Laceration    Lawrence Bonilla is a 69 y.o. male with past medical history significant for a fib on eliquis, CKD stage 3, CHF, hypertension, lymphedema presents to emergency department today with chief complain of laceration x 6 days ago. Patient states he first noticed a scab on his left leg without known injury or fall. He frequently wraps his legs for his lymphedema. He states after wrapping legs he noticed the scab had fallen off and was wound was bleeding. The wound has continued to have slow oozing blood. He and spouse have been applying dressings daily. No medications for his symptoms prior to arrival. He denies any pain, fever, chills, numbness, weakness, tingling, decreased sensation in his left leg.    Past Medical History:  Diagnosis Date  . Atrial fibrillation ([place])   . CKD (chronic kidney disease) stage 3, GFR 30-59 ml/min   . Congestive heart failure (CHF) ([place])   . DM type 2 (diabetes mellitus, type 2) ([place])   . Essential hypertension   . Hyperlipidemia   . Hypothyroidism   . Lymphedema   . OSA (obstructive sleep apnea)   . PVD (peripheral vascular disease) ([place])   . Secondary cardiomyopathy ([place])   . Vitamin D deficiency disease     Patient Active Problem List   Diagnosis Date Noted  . Hypothyroidism, adult 02/06/2019  . DM type 2 (diabetes mellitus, type 2) ([place]) 02/06/2019  . Vitamin D deficiency disease 02/06/2019  . Acute on chronic systolic CHF (congestive heart failure) ([place]) 01/28/2019  . CKD (chronic kidney disease), stage IV ([place]) 01/28/2019  . Pleural effusion on right 01/28/2019  . Stroke ([place]) 01/28/2019  . Supratherapeutic INR 01/28/2019    Past Surgical History:  Procedure Laterality Date  . APPENDECTOMY    . BARIATRIC SURGERY    . RIGHT HEART CATH N/A 05/02/2019   Procedure: RIGHT HEART  CATH;  Surgeon: Jolaine Artist, MD;  Location: [facility];  Service: Cardiovascular;  Laterality: N/A;       Family History  Problem Relation Age of Onset  . Asthma Mother     Social History   Tobacco Use  . Smoking status: Never Smoker  . Smokeless tobacco: Never Used  Substance Use Topics  . Alcohol use: Yes    Comment: 2 SCOTCH   . Drug use: Never    Home Medications Prior to Admission medications   Medication Sig Start Date End Date Taking? Authorizing Provider  apixaban (ELIQUIS) 5 MG TABS tablet Take 1 tablet (5 mg total) by mouth 2 (two) times daily. 09/03/19  Yes Gosrani, Nimish C, MD  atorvastatin (LIPITOR) 40 MG tablet Take 1 tablet (40 mg total) by mouth daily. 07/17/19 10/15/19 Yes Gosrani, Nimish C, MD  calcitRIOL (ROCALTROL) 0.5 MCG capsule Take 0.5 mcg by mouth daily. 08/12/19  Yes [provider]  CALCIUM PO Take 600 mg by mouth daily. Vitamin D3   Yes [provider]  Ferrous Sulfate (IRON PO) Take 65 mg by mouth daily.    Yes [provider]  furosemide (LASIX) 40 MG tablet Take 2 tablets (80 mg total) by mouth 2 (two) times daily. 09/03/19  Yes Gosrani, Nimish C, MD  hydrALAZINE (APRESOLINE) 10 MG tablet Take 1 tablet (10 mg total) by mouth 2 (two) times daily. 07/17/19  Yes Gosrani,  Nimish C, MD  isosorbide mononitrate (IMDUR) 30 MG 24 hr tablet TAKE 1 TABLET(30 MG) BY MOUTH DAILY 09/25/19  Yes Gosrani, Nimish C, MD  loperamide (IMODIUM A-D) 2 MG tablet Take 1 tablet (2 mg total) by mouth 4 (four) times daily as needed for diarrhea or loose stools. 05/15/19  Yes Gosrani, Nimish C, MD  metolazone (ZAROXOLYN) 2.5 MG tablet TAKE 1 TABLET BY MOUTH DAILY ON SATURDAY AND SUNDAY TO GET WEIGHT DOWN 5-6 LBS 07/17/19  Yes Gosrani, Nimish C, MD  metoprolol succinate (TOPROL-XL) 50 MG 24 hr tablet Take 1 tablet (50 mg total) by mouth daily. Take with or immediately following a meal. 08/07/19  Yes Bensimhon, Shaune Pascal, MD  calcitRIOL (ROCALTROL)  0.25 MCG capsule Take 1 capsule (0.25 mcg total) by mouth daily. Patient not taking: Reported on 09/30/2019 07/17/19   Doree Albee, MD  doxycycline (VIBRAMYCIN) 100 MG capsule Take 1 capsule (100 mg total) by mouth 2 (two) times daily for 5 days. 09/30/19 10/05/19  Evora Schechter, Harley Hallmark, PA-C    Allergies    Patient has no known allergies.  Review of Systems   Review of Systems All other systems are reviewed and are negative for acute change except as noted in the HPI.  Physical Exam Updated Vital Signs BP 131/82 (BP Location: Right Arm)   Pulse 62   Temp 97.7 F (36.5 C) (Oral)   Ht 6' (1.829 m)   Wt 78.5 kg   SpO2 100%   BMI 23.46 kg/m   Physical Exam Vitals and nursing note reviewed.  Constitutional:      Appearance: He is well-developed. He is not ill-appearing or toxic-appearing.  HENT:     Head: Normocephalic and atraumatic.     Nose: Nose normal.  Eyes:     General: No scleral icterus.       Right eye: No discharge.        Left eye: No discharge.     Conjunctiva/sclera: Conjunctivae normal.  Neck:     Vascular: No JVD.  Cardiovascular:     Rate and Rhythm: Normal rate and regular rhythm.     Pulses: Normal pulses.     Heart sounds: Normal heart sounds.  Pulmonary:     Effort: Pulmonary effort is normal.     Breath sounds: Normal breath sounds.  Abdominal:     General: There is no distension.  Musculoskeletal:        General: Normal range of motion.     Cervical back: Normal range of motion.       Legs:     Comments: 2 x 1 cm skin tear with oozing blood. Minimal surrounding erythema. No purulent drainage. No palpable fluctuance.   Skin:    General: Skin is warm and dry.  Neurological:     Mental Status: He is oriented to person, place, and time.     GCS: GCS eye subscore is 4. GCS verbal subscore is 5. GCS motor subscore is 6.     Comments: Fluent speech, no facial droop.  Psychiatric:        Behavior: Behavior normal.       ED Results /  Procedures / Treatments   Labs (all labs ordered are listed, but only abnormal results are displayed) Labs Reviewed - No data to display  EKG None  Radiology No results found.  Procedures Procedures (including critical care time)  Medications Ordered in ED Medications - No data to display  ED Course  I have  reviewed the triage vital signs and the nursing notes.  Pertinent labs & imaging results that were available during my care of the patient were reviewed by me and considered in my medical decision making (see chart for details).    MDM Rules/Calculators/A&P                      Patient seen and examined. Patient presents awake, alert, hemodynamically stable, afebrile, non toxic. No tachycardia or hypoxia. Patient has skin tear on lateral aspect of left lower extremity. There is slow oozing bleeding with small clot formed at base of tear. There is minimal surrounding erythema. No purulent drainage. Hemostasis achieved with direct pressure. Pressure dressing applied. Will cover for possible infection with short course of doxycyline as patient has history of CKD want to avoid Keflex.   The patient appears reasonably screened and/or stabilized for discharge and I doubt any other medical condition or other [facility] requiring further screening, evaluation, or treatment in the ED at this time prior to discharge. The patient is safe for discharge with strict return precautions discussed. Recommend pcp follow up in 2 days for wound check. The patient was discussed with and seen by Dr. Ashok Cordia who agrees with the treatment plan.   Portions of this note were generated with [employer]obbyist. Dictation errors may occur despite best attempts at proofreading.   Final Clinical Impression(s) / ED Diagnoses Final diagnoses:  Wound of left lower extremity, initial encounter    Rx / DC Orders ED Discharge Orders         Ordered    doxycycline (VIBRAMYCIN) 100 MG capsule  2 times daily      09/30/19 1506           Tamey Wanek, Harley Hallmark, PA-C 09/30/19 1528    Lajean Saver, MD 10/01/19 [phone]

## 2019-09-30 NOTE — Discharge Instructions (Signed)
You have been seen today for leg wound. Please read and follow all provided instructions. Return to the emergency room for worsening condition or new concerning symptoms including fever, chills, pus draining from the wound, worsening redness or swelling.  1. Medications:  Prescription for doxycycline sent to your pharmacy.  This is an antibiotic to help treat skin infections.  Please take as prescribed.  Continue usual home medications Take medications as prescribed. Please review all of the medicines and only take them if you do not have an allergy to them.   2. Treatment: Wear the bandage on your leg for 24 hours.  After that continue daily wound care  3. Follow Up:  Please follow up with primary care provider in 2 days for wound check.   It is also a possibility that you have an allergic reaction to any of the medicines that you have been prescribed - Everybody reacts differently to medications and while MOST people have no trouble with most medicines, you may have a reaction such as nausea, vomiting, rash, swelling, shortness of breath. If this is the case, please stop taking the medicine immediately and contact your physician.  ?

## 2019-09-30 NOTE — ED Triage Notes (Signed)
States he has a laceration on left leg that has been bleeding intermittent for the past 6 days. Bandaged on arrival, does take blood thinner.

## 2019-10-07 ENCOUNTER — Other Ambulatory Visit (INDEPENDENT_AMBULATORY_CARE_PROVIDER_SITE_OTHER): Payer: Self-pay

## 2019-10-07 MED ORDER — APIXABAN 5 MG PO TABS: 5.0000 mg | ORAL_TABLET | Freq: Two times a day (BID) | ORAL | 3 refills | Status: DC

## 2019-10-08 DIAGNOSIS — N184 Chronic kidney disease, stage 4 (severe): Secondary | ICD-10-CM | POA: Diagnosis not present

## 2019-10-11 DIAGNOSIS — Q6102 Congenital multiple renal cysts: Secondary | ICD-10-CM | POA: Diagnosis not present

## 2019-10-11 DIAGNOSIS — E876 Hypokalemia: Secondary | ICD-10-CM | POA: Diagnosis not present

## 2019-10-11 DIAGNOSIS — R7303 Prediabetes: Secondary | ICD-10-CM | POA: Diagnosis not present

## 2019-10-11 DIAGNOSIS — N184 Chronic kidney disease, stage 4 (severe): Secondary | ICD-10-CM | POA: Diagnosis not present

## 2019-10-11 DIAGNOSIS — I5022 Chronic systolic (congestive) heart failure: Secondary | ICD-10-CM | POA: Diagnosis not present

## 2019-10-11 DIAGNOSIS — R809 Proteinuria, unspecified: Secondary | ICD-10-CM | POA: Diagnosis not present

## 2019-10-11 DIAGNOSIS — N2581 Secondary hyperparathyroidism of renal origin: Secondary | ICD-10-CM | POA: Diagnosis not present

## 2019-10-11 DIAGNOSIS — N179 Acute kidney failure, unspecified: Secondary | ICD-10-CM | POA: Diagnosis not present

## 2019-10-11 DIAGNOSIS — I129 Hypertensive chronic kidney disease with stage 1 through stage 4 chronic kidney disease, or unspecified chronic kidney disease: Secondary | ICD-10-CM | POA: Diagnosis not present

## 2019-10-18 DIAGNOSIS — N185 Chronic kidney disease, stage 5: Secondary | ICD-10-CM | POA: Diagnosis not present

## 2019-10-18 DIAGNOSIS — N184 Chronic kidney disease, stage 4 (severe): Secondary | ICD-10-CM | POA: Diagnosis not present

## 2019-10-19 DIAGNOSIS — G4733 Obstructive sleep apnea (adult) (pediatric): Secondary | ICD-10-CM | POA: Diagnosis not present

## 2019-10-23 ENCOUNTER — Inpatient Hospital Stay (HOSPITAL_COMMUNITY): Admission: RE | Admit: 2019-10-23 | Payer: Medicare HMO | Source: Ambulatory Visit | Admitting: Internal Medicine

## 2019-10-25 ENCOUNTER — Other Ambulatory Visit: Payer: Self-pay

## 2019-10-25 DIAGNOSIS — N186 End stage renal disease: Secondary | ICD-10-CM | POA: Diagnosis not present

## 2019-10-25 DIAGNOSIS — Z992 Dependence on renal dialysis: Secondary | ICD-10-CM | POA: Diagnosis not present

## 2019-10-28 DIAGNOSIS — Q6102 Congenital multiple renal cysts: Secondary | ICD-10-CM | POA: Diagnosis not present

## 2019-10-28 DIAGNOSIS — R809 Proteinuria, unspecified: Secondary | ICD-10-CM | POA: Diagnosis not present

## 2019-10-28 DIAGNOSIS — E876 Hypokalemia: Secondary | ICD-10-CM | POA: Diagnosis not present

## 2019-10-28 DIAGNOSIS — I120 Hypertensive chronic kidney disease with stage 5 chronic kidney disease or end stage renal disease: Secondary | ICD-10-CM | POA: Diagnosis not present

## 2019-10-28 DIAGNOSIS — N2581 Secondary hyperparathyroidism of renal origin: Secondary | ICD-10-CM | POA: Diagnosis not present

## 2019-10-28 DIAGNOSIS — R7303 Prediabetes: Secondary | ICD-10-CM | POA: Diagnosis not present

## 2019-10-28 DIAGNOSIS — N185 Chronic kidney disease, stage 5: Secondary | ICD-10-CM | POA: Diagnosis not present

## 2019-10-28 DIAGNOSIS — I5022 Chronic systolic (congestive) heart failure: Secondary | ICD-10-CM | POA: Diagnosis not present

## 2019-10-29 DIAGNOSIS — N2581 Secondary hyperparathyroidism of renal origin: Secondary | ICD-10-CM | POA: Diagnosis not present

## 2019-10-29 DIAGNOSIS — Z992 Dependence on renal dialysis: Secondary | ICD-10-CM | POA: Diagnosis not present

## 2019-10-29 DIAGNOSIS — N186 End stage renal disease: Secondary | ICD-10-CM | POA: Diagnosis not present

## 2019-10-29 NOTE — Telephone Encounter (Signed)
See 05/13/19 note

## 2019-10-31 ENCOUNTER — Encounter (HOSPITAL_COMMUNITY): Payer: Self-pay | Admitting: Vascular Surgery

## 2019-10-31 ENCOUNTER — Other Ambulatory Visit (HOSPITAL_COMMUNITY)
Admission: RE | Admit: 2019-10-31 | Discharge: 2019-10-31 | Disposition: A | Discharge status: Home / Self Care | Payer: Medicare HMO | Source: Ambulatory Visit | Attending: Vascular Surgery | Admitting: Vascular Surgery

## 2019-10-31 ENCOUNTER — Telehealth: Payer: Self-pay

## 2019-10-31 ENCOUNTER — Other Ambulatory Visit: Payer: Self-pay

## 2019-10-31 DIAGNOSIS — Z20822 Contact with and (suspected) exposure to covid-19: Secondary | ICD-10-CM | POA: Insufficient documentation

## 2019-10-31 DIAGNOSIS — Z01812 Encounter for preprocedural laboratory examination: Secondary | ICD-10-CM | POA: Diagnosis not present

## 2019-10-31 LAB — SARS CORONAVIRUS 2 (TAT 6-24 HRS): SARS Coronavirus 2: NEGATIVE

## 2019-10-31 NOTE — Telephone Encounter (Signed)
Spoke with Dr. Donnetta Hutching. Informed that pt did not hold Eliquis as instructed. Dr. Donnetta Hutching advised surgery would have to be postponed.   Contacted pt to reschedule surgery for 11/04/19 at 730am- arrive at 62 at [facility] admitting. Instructed pt to hold his Eliquis 3 days prior to procedure- last dose today. Pt verbalized all instructions given back with understanding.   Spoke with Sharee Pimple in [place]- scheduling. Case moved from 6/11 to 11/04/19- room 16. Minette Brine, RN

## 2019-10-31 NOTE — Telephone Encounter (Signed)
Telephone call from [facility] with [facility]-Preadmission testing requesting instructions about pts Eliquis- Pt stated he didn't get any instructions.   Surgery appt was arranged and instructions provided to [facility] on 10/24/19.   Contacted Shaqueena with [facility] who confirmed that pt was giving surgery instructions including to hold his Eliquis 3 days prior and instructed to write down information.   Contacted patient. No answer. LMOM to return call to confirm whether he has held Eliquis as instructed. Contacted Esther with PAT and informed her of information above. Voiced understanding and also continue to reach pt. Minette Brine, RN

## 2019-10-31 NOTE — Anesthesia Preprocedure Evaluation (Addendum)
Anesthesia Evaluation  Patient identified by MRN, date of birth, ID band Patient awake    Reviewed: Allergy & Precautions, NPO status , Patient's Chart, lab work & pertinent test results, reviewed documented beta blocker date and time   History of Anesthesia Complications Negative for: history of anesthetic complications  Airway Mallampati: II  TM Distance: >3 FB Neck ROM: Full    Dental  (+) Dental Advisory Given, Chipped   Pulmonary sleep apnea and Oxygen sleep apnea , COPD,  oxygen dependent, PE 10/31/2019 SARS coronavirus NEG   breath sounds clear to auscultation       Cardiovascular hypertension, Pt. on medications and Pt. on home beta blockers + Peripheral Vascular Disease and +CHF  + dysrhythmias Atrial Fibrillation  Rhythm:Regular Rate:Normal  RHC 05/02/19: RA = 18 RV = 66/14 PA = 69/30 (42) PCW = 24 Fick cardiac output/index = 4.1/1.9 Thermo CO/CI = 4.0/1.8 PVR = 4.5 WU Ao sat = 99% PA sat = 55%, 56% Assessment: 1. Moderately elevated biventricular pressures with low cardiac output with restrictive cardiomypathy   Echo 01/10/19: 1. The left ventricle has severely reduced systolic function, with an  ejection fraction of 25-30%. The cavity size was mildly dilated. Left  ventricular diastolic Doppler parameters are indeterminate.  2. The right ventricle has mildly reduced systolic function. The cavity  was moderately enlarged. There is no increase in right ventricular wall  thickness.  3. Left atrial size was severely dilated.  4. Right atrial size was severely dilated.  5. No evidence of mitral valve stenosis.  6. Tricuspid valve regurgitation is moderate.  7. The aortic valve is tricuspid. No stenosis of the aortic valve.  8. The aorta is normal unless otherwise noted.  9. The aortic root is normal in size and structure.  10. Pulmonary hypertension is moderately elevated, PASP is 63 mmHg.     Neuro/Psych  Headaches, CVA, No Residual Symptoms    GI/Hepatic Neg liver ROS, S/p gastric bypass   Endo/Other  diabetes (diet controlled, glu 79)  Renal/GU ESRF and DialysisRenal disease (K+ 3.6)     Musculoskeletal   Abdominal   Peds  Hematology eliquis   Anesthesia Other Findings   Reproductive/Obstetrics                           Anesthesia Physical Anesthesia Plan  ASA: III  Anesthesia Plan: MAC   Post-op Pain Management:    Induction:   PONV Risk Score and Plan: 1 and Ondansetron and Treatment may vary due to age or medical condition  Airway Management Planned: Simple Face Mask and Natural Airway  Additional Equipment:   Intra-op Plan:   Post-operative Plan:   Informed Consent: I have reviewed the patients History and Physical, chart, labs and discussed the procedure including the risks, benefits and alternatives for the proposed anesthesia with the patient or authorized representative who has indicated his/her understanding and acceptance.     Dental advisory given  Plan Discussed with: CRNA and Surgeon  Anesthesia Plan Comments: (PAT note written 10/31/2019 by Myra Gianotti, PA-C. )      Anesthesia Quick Evaluation

## 2019-10-31 NOTE — Progress Notes (Signed)
Nurse lvm with Larene Beach, Surgical Coordinator to follow up with pt regarding Pre-op Eliquis instructions.

## 2019-10-31 NOTE — Progress Notes (Signed)
Anesthesia Chart Review: Lawrence Bonilla   Case: [id number] Date/Time: 11/01/19 0715   Procedure: INSERTION OF ARTERIOVENOUS (AV) GORE-TEX GRAFT ARM (Left )   Anesthesia type: Monitor Anesthesia Care   Pre-op diagnosis: CHRONIC KIDNEY DISEASE STAGE 4   Location: [facility] ROOM 11 / [facility]   Surgeons: Rosetta Posner, MD      DISCUSSION: Patient is a 69 year old male scheduled for the above procedure.  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  He moved to [place] from [place], [place] in April 2020.  Last evaluated by cardiologist Rozann Lesches, MD on 09/13/19 for routine cardiology follow-up.  A. fib controlled.  Fluid status had improved, medications adjusted.  Aware renal function worsening and considering plans for hemodialysis access procedure.  51-month follow-up planned.  Patient reported that he had not been given perioperative instructions regarding Elilquis, so I called and spoke with staff at Dr. Pennie Rushing will clarify and contact Lawrence Bonilla.  Preoperative COVID-19 test is scheduled for 10/31/19 at 12:00 PM. He is a same day work-up, so he will get labs and anesthesia team evaluation on the day of surgery.   VS:   Wt Readings from Last 3 Encounters:  09/30/19 78.5 kg  09/13/19 79.4 kg  09/09/19 79.1 kg   BP Readings from Last 3 Encounters:  09/30/19 120/73  09/13/19 122/74  09/09/19 130/70   Pulse Readings from Last 3 Encounters:  09/30/19 61  09/13/19 65  09/09/19 61    PROVIDERS: Ailene Ards, NP is listed as PCP; however, seen by Hurshel Party, MD last on 09/09/19. Rozann Lesches, MD is cardiologist - Glori Bickers, MD is [facility] cardiologist.  Last evaluation 08/07/2019.  He suspected restrictive cardiomyopathy was due to longstanding uncontrolled hypertension and probable OSA/OHS ("TTR amyloid a consideration but making diagnosis at this point would not change our management".  Not currently a candidate for coronary angiography or cMRI with CKD. Harrie Jeans, MD is nephrologist - Fransico Him, MD is OSA cardiologist. "PSG on 04/04/19 AHI 56 with 27% CSA."   LABS: He is for labs on the day of surgery. As of 09/09/19 ,Cr 4.29, A1c 5.2%, TSH 61.65 (high and prescribed NP thyroid 60 mg daily by Dr. Anastasio Champion). H/H 12.2/36.0.    IMAGES: CXR 02/18/19 (in setting of admission for acute CHF): IMPRESSION: 1. Cardiomegaly with vascular congestion and mild interstitial edema. 2. Bilateral pleural effusions, small moderate in size. Effusion appears stable on the right and slightly increased on the left. Persistent basilar airspace disease, slightly worse at the left base.  VQ Scan 01/29/19 (in setting of admission for acute CHF): IMPRESSION: Segmental level perfusion defects in each lower lobe, a finding placing this study in a high probability category based on PIOPED II criteria.  MRI Brain 01/29/19 (in setting of admission for acute CHF): IMPRESSION: 1. No acute intracranial abnormality identified. 2. Chronic right PCA territory infarct involving the parasagittal right occipital lobe. 3. Age-related cerebral atrophy with moderate chronic microvascular ischemic disease.   EKG: 05/02/19: Atrial fibrillation at 63 bpm Left axis deviation Nonspecific ST and T wave abnormality Prolonged QT (QT 476/QT 487 ms) Abnormal ECG Confirmed  by Cherlynn Kaiser [phone]) on 05/05/2019 11:43:17 AM   CV: RHC 05/02/19: Findings:  RA = 18 RV = 66/14 PA = 69/30 (42) PCW = 24 Fick cardiac output/index = 4.1/1.9 Thermo CO/CI =  4.0/1.8 PVR = 4.5 WU Ao sat = 99% PA sat = 55%, 56%  Assessment: 1. Moderately elevated biventricular pressures with low cardiac output with restrictive cardiomypathy Plan/Discussion: Options very limited with CKD and other comorbidities. Will push diuresis gently.  Glori Bickers, MD  12:39 PM   Carotid US 01/28/19: IMPRESSION: Unremarkable carotid Doppler ultrasound.   Echo 01/10/19: IMPRESSIONS  1. The left ventricle has severely reduced systolic function, with an  ejection fraction of 25-30%. The cavity size was mildly dilated. Left  ventricular diastolic Doppler parameters are indeterminate.  2. The right ventricle has mildly reduced systolic function. The cavity  was moderately enlarged. There is no increase in right ventricular wall  thickness.  3. Left atrial size was severely dilated.  4. Right atrial size was severely dilated.  5. No evidence of mitral valve stenosis.  6. Tricuspid valve regurgitation is moderate.  7. The aortic valve is tricuspid. No stenosis of the aortic valve.  8. The aorta is normal unless otherwise noted.  9. The aortic root is normal in size and structure.  10. Pulmonary hypertension is moderately elevated, PASP is 63 mmHg.  (Comparison: LVEF 40-45% 05/25/16)   Past Medical History:  Diagnosis Date  . Atrial fibrillation ([place])   . CKD (chronic kidney disease) stage 3, GFR 30-59 ml/min   . Congestive heart failure (CHF) ([place])   . DM type 2 (diabetes mellitus, type 2) ([place])   . Essential hypertension   . Hyperlipidemia   . Hypothyroidism   . Lymphedema   . OSA (obstructive sleep apnea)   . PVD (peripheral vascular disease) ([place])   . Secondary cardiomyopathy ([place])   . Vitamin D deficiency disease     Past Surgical History:  Procedure Laterality Date  . APPENDECTOMY    . BARIATRIC SURGERY    . RIGHT HEART CATH N/A 05/02/2019   Procedure: RIGHT HEART CATH;  Surgeon: Jolaine Artist, MD;  Location: [place] [facility];   Service: Cardiovascular;  Laterality: N/A;    MEDICATIONS: No current facility-administered medications for this encounter.   Marland Kitchen apixaban (ELIQUIS) 5 MG TABS tablet  . Calcium Carb-Cholecalciferol (CALCIUM 600 + D PO)  . ferrous sulfate 325 (65 FE) MG tablet  . furosemide (LASIX) 40 MG tablet  . hydrALAZINE (APRESOLINE) 10 MG tablet  . isosorbide mononitrate (IMDUR) 30 MG 24 hr tablet  . metoprolol succinate (TOPROL-XL) 50 MG 24 hr tablet  . thyroid (ARMOUR) 60 MG tablet  . atorvastatin (LIPITOR) 40 MG tablet  . calcitRIOL (ROCALTROL) 0.25 MCG capsule  . metolazone (ZAROXOLYN) 2.5 MG tablet     Myra Gianotti, PA-C Surgical Short Stay/Anesthesiology [facility] Phone [phone] [facility] Phone [phone] 10/31/2019 11:34 AM

## 2019-10-31 NOTE — Progress Notes (Signed)
Pt denies SOB and chest pain. Pt stated that he is under the care of Dr. Haroldine Laws, Cardiology and Dr. Anastasio Champion, PCP. Pt stated that he will call Dr. Royce Macadamia, Nephrologist, at dialysis center to clarify instructions regarding Hydralazine. Pt instructed to call Dr. Luther Parody office, if he has not heard from them by 3:00 PM, regarding pre-op Eliquis instructions.  Pt stated that a stress test was performed > 10 years ago. Pt stated that he does not take diabetes medication nor does he check his CBG.  Pt made aware to stop taking vitamins, fish oil and herbal medications. Do not take any NSAIDs ie: Ibuprofen, Advil, Naproxen (Aleve), Motrin, BC and Goody Powder. Pt reminded to quarantine. Pt verbalized understanding of all pre-op instructions. PA, Anesthesiology, asked to review pt history.

## 2019-11-02 DIAGNOSIS — N186 End stage renal disease: Secondary | ICD-10-CM | POA: Diagnosis not present

## 2019-11-02 DIAGNOSIS — N2581 Secondary hyperparathyroidism of renal origin: Secondary | ICD-10-CM | POA: Diagnosis not present

## 2019-11-02 DIAGNOSIS — Z992 Dependence on renal dialysis: Secondary | ICD-10-CM | POA: Diagnosis not present

## 2019-11-04 ENCOUNTER — Ambulatory Visit (HOSPITAL_COMMUNITY): Payer: Medicare HMO | Admitting: Vascular Surgery

## 2019-11-04 ENCOUNTER — Encounter (HOSPITAL_COMMUNITY)
Admission: RE | Disposition: A | Discharge status: Home / Self Care | Payer: Self-pay | Source: Home / Self Care | Attending: Vascular Surgery

## 2019-11-04 ENCOUNTER — Other Ambulatory Visit: Payer: Self-pay

## 2019-11-04 ENCOUNTER — Ambulatory Visit (HOSPITAL_COMMUNITY)
Admission: RE | Admit: 2019-11-04 | Discharge: 2019-11-04 | Disposition: A | Discharge status: Home / Self Care | Payer: Medicare HMO | Attending: Vascular Surgery | Admitting: Vascular Surgery

## 2019-11-04 ENCOUNTER — Encounter (HOSPITAL_COMMUNITY): Payer: Self-pay | Admitting: Vascular Surgery

## 2019-11-04 DIAGNOSIS — Z9884 Bariatric surgery status: Secondary | ICD-10-CM | POA: Insufficient documentation

## 2019-11-04 DIAGNOSIS — E785 Hyperlipidemia, unspecified: Secondary | ICD-10-CM | POA: Insufficient documentation

## 2019-11-04 DIAGNOSIS — J9611 Chronic respiratory failure with hypoxia: Secondary | ICD-10-CM | POA: Diagnosis not present

## 2019-11-04 DIAGNOSIS — Z79890 Hormone replacement therapy (postmenopausal): Secondary | ICD-10-CM | POA: Diagnosis not present

## 2019-11-04 DIAGNOSIS — I132 Hypertensive heart and chronic kidney disease with heart failure and with stage 5 chronic kidney disease, or end stage renal disease: Secondary | ICD-10-CM | POA: Diagnosis not present

## 2019-11-04 DIAGNOSIS — I4821 Permanent atrial fibrillation: Secondary | ICD-10-CM | POA: Insufficient documentation

## 2019-11-04 DIAGNOSIS — I5023 Acute on chronic systolic (congestive) heart failure: Secondary | ICD-10-CM | POA: Diagnosis not present

## 2019-11-04 DIAGNOSIS — E039 Hypothyroidism, unspecified: Secondary | ICD-10-CM | POA: Insufficient documentation

## 2019-11-04 DIAGNOSIS — J449 Chronic obstructive pulmonary disease, unspecified: Secondary | ICD-10-CM | POA: Diagnosis not present

## 2019-11-04 DIAGNOSIS — G4733 Obstructive sleep apnea (adult) (pediatric): Secondary | ICD-10-CM | POA: Insufficient documentation

## 2019-11-04 DIAGNOSIS — E559 Vitamin D deficiency, unspecified: Secondary | ICD-10-CM | POA: Diagnosis not present

## 2019-11-04 DIAGNOSIS — E1151 Type 2 diabetes mellitus with diabetic peripheral angiopathy without gangrene: Secondary | ICD-10-CM | POA: Insufficient documentation

## 2019-11-04 DIAGNOSIS — N185 Chronic kidney disease, stage 5: Secondary | ICD-10-CM | POA: Diagnosis not present

## 2019-11-04 DIAGNOSIS — I5042 Chronic combined systolic (congestive) and diastolic (congestive) heart failure: Secondary | ICD-10-CM | POA: Diagnosis not present

## 2019-11-04 DIAGNOSIS — Z9981 Dependence on supplemental oxygen: Secondary | ICD-10-CM | POA: Diagnosis not present

## 2019-11-04 DIAGNOSIS — I130 Hypertensive heart and chronic kidney disease with heart failure and stage 1 through stage 4 chronic kidney disease, or unspecified chronic kidney disease: Secondary | ICD-10-CM | POA: Diagnosis not present

## 2019-11-04 DIAGNOSIS — I429 Cardiomyopathy, unspecified: Secondary | ICD-10-CM | POA: Diagnosis not present

## 2019-11-04 DIAGNOSIS — E1122 Type 2 diabetes mellitus with diabetic chronic kidney disease: Secondary | ICD-10-CM | POA: Insufficient documentation

## 2019-11-04 DIAGNOSIS — Z79899 Other long term (current) drug therapy: Secondary | ICD-10-CM | POA: Diagnosis not present

## 2019-11-04 DIAGNOSIS — E119 Type 2 diabetes mellitus without complications: Secondary | ICD-10-CM | POA: Diagnosis not present

## 2019-11-04 DIAGNOSIS — N184 Chronic kidney disease, stage 4 (severe): Secondary | ICD-10-CM | POA: Diagnosis not present

## 2019-11-04 DIAGNOSIS — N186 End stage renal disease: Secondary | ICD-10-CM | POA: Diagnosis not present

## 2019-11-04 HISTORY — DX: Headache, unspecified: R51.9

## 2019-11-04 HISTORY — DX: Cerebral infarction, unspecified: I63.9

## 2019-11-04 HISTORY — DX: Presence of spectacles and contact lenses: Z97.3

## 2019-11-04 HISTORY — PX: AV FISTULA PLACEMENT: SHX1204

## 2019-11-04 HISTORY — DX: Other pulmonary embolism without acute cor pulmonale: I26.99

## 2019-11-04 LAB — POCT I-STAT, CHEM 8
BUN: 59 mg/dL — ABNORMAL HIGH (ref 8–23)
Calcium, Ion: 1.06 mmol/L — ABNORMAL LOW (ref 1.15–1.40)
Chloride: 102 mmol/L (ref 98–111)
Creatinine, Ser: 4.3 mg/dL — ABNORMAL HIGH (ref 0.61–1.24)
Glucose, Bld: 87 mg/dL (ref 70–99)
HCT: 25 % — ABNORMAL LOW (ref 39.0–52.0)
Hemoglobin: 8.5 g/dL — ABNORMAL LOW (ref 13.0–17.0)
Potassium: 3.6 mmol/L (ref 3.5–5.1)
Sodium: 141 mmol/L (ref 135–145)
TCO2: 23 mmol/L (ref 22–32)

## 2019-11-04 LAB — GLUCOSE, CAPILLARY
Glucose-Capillary: 79 mg/dL (ref 70–99)
Glucose-Capillary: 73 mg/dL (ref 70–99)

## 2019-11-04 LAB — PROTIME-INR
INR: 1.2 (ref 0.8–1.2)
Prothrombin Time: 14.6 seconds (ref 11.4–15.2)

## 2019-11-04 SURGERY — ARTERIOVENOUS (AV) FISTULA CREATION
Anesthesia: Monitor Anesthesia Care | Site: Arm Lower | Laterality: Left

## 2019-11-04 MED ORDER — MIDAZOLAM HCL 5 MG/5ML IJ SOLN
INTRAMUSCULAR | Status: DC | PRN
  Administered 2019-11-04: 2 mg via INTRAVENOUS

## 2019-11-04 MED ORDER — PROPOFOL 500 MG/50ML IV EMUL
INTRAVENOUS | Status: DC | PRN
  Administered 2019-11-04: 75 ug/kg/min via INTRAVENOUS

## 2019-11-04 MED ORDER — CHLORHEXIDINE GLUCONATE 0.12 % MT SOLN
OROMUCOSAL | Status: AC
  Administered 2019-11-04: 15 mL via OROMUCOSAL
  Filled 2019-11-04: qty 15

## 2019-11-04 MED ORDER — MIDAZOLAM HCL 2 MG/2ML IJ SOLN: 0.5000 mg | Freq: Once | INTRAMUSCULAR | Status: DC | PRN

## 2019-11-04 MED ORDER — LIDOCAINE-EPINEPHRINE 0.5 %-1:200000 IJ SOLN
INTRAMUSCULAR | Status: DC | PRN
  Administered 2019-11-04: 8 mL

## 2019-11-04 MED ORDER — HYDROCODONE-ACETAMINOPHEN 5-325 MG PO TABS: 1.0000 | ORAL_TABLET | Freq: Four times a day (QID) | ORAL | 0 refills | Status: DC | PRN

## 2019-11-04 MED ORDER — PROMETHAZINE HCL 25 MG/ML IJ SOLN: 6.2500 mg | INTRAMUSCULAR | Status: DC | PRN

## 2019-11-04 MED ORDER — 0.9 % SODIUM CHLORIDE (POUR BTL) OPTIME
TOPICAL | Status: DC | PRN
  Administered 2019-11-04: 1000 mL

## 2019-11-04 MED ORDER — FENTANYL CITRATE (PF) 250 MCG/5ML IJ SOLN
INTRAMUSCULAR | Status: AC
  Filled 2019-11-04: qty 5

## 2019-11-04 MED ORDER — SODIUM CHLORIDE 0.9 % IV SOLN: INTRAVENOUS | Status: DC

## 2019-11-04 MED ORDER — FENTANYL CITRATE (PF) 100 MCG/2ML IJ SOLN: 25.0000 ug | INTRAMUSCULAR | Status: DC | PRN

## 2019-11-04 MED ORDER — CHLORHEXIDINE GLUCONATE 0.12 % MT SOLN: 15.0000 mL | Freq: Once | OROMUCOSAL | Status: AC

## 2019-11-04 MED ORDER — MEPERIDINE HCL 25 MG/ML IJ SOLN: 6.2500 mg | INTRAMUSCULAR | Status: DC | PRN

## 2019-11-04 MED ORDER — CEFAZOLIN SODIUM-DEXTROSE 2-4 GM/100ML-% IV SOLN
INTRAVENOUS | Status: AC
  Filled 2019-11-04: qty 100

## 2019-11-04 MED ORDER — FENTANYL CITRATE (PF) 100 MCG/2ML IJ SOLN
INTRAMUSCULAR | Status: DC | PRN
  Administered 2019-11-04: 50 ug via INTRAVENOUS

## 2019-11-04 MED ORDER — ORAL CARE MOUTH RINSE: 15.0000 mL | Freq: Once | OROMUCOSAL | Status: AC

## 2019-11-04 MED ORDER — CHLORHEXIDINE GLUCONATE 4 % EX LIQD: 60.0000 mL | Freq: Once | CUTANEOUS | Status: DC

## 2019-11-04 MED ORDER — PHENYLEPHRINE 40 MCG/ML (10ML) SYRINGE FOR IV PUSH (FOR BLOOD PRESSURE SUPPORT)
PREFILLED_SYRINGE | INTRAVENOUS | Status: DC | PRN
  Administered 2019-11-04 (×2): 40 ug via INTRAVENOUS

## 2019-11-04 MED ORDER — CEFAZOLIN SODIUM-DEXTROSE 2-4 GM/100ML-% IV SOLN
2.0000 g | INTRAVENOUS | Status: AC
  Administered 2019-11-04: 2 g via INTRAVENOUS

## 2019-11-04 MED ORDER — ONDANSETRON HCL 4 MG/2ML IJ SOLN
INTRAMUSCULAR | Status: AC
  Filled 2019-11-04: qty 2

## 2019-11-04 MED ORDER — LIDOCAINE-EPINEPHRINE 0.5 %-1:200000 IJ SOLN
INTRAMUSCULAR | Status: AC
  Filled 2019-11-04: qty 1

## 2019-11-04 MED ORDER — MIDAZOLAM HCL 2 MG/2ML IJ SOLN
INTRAMUSCULAR | Status: AC
  Filled 2019-11-04: qty 2

## 2019-11-04 MED ORDER — ONDANSETRON HCL 4 MG/2ML IJ SOLN
INTRAMUSCULAR | Status: DC | PRN
  Administered 2019-11-04: 4 mg via INTRAVENOUS

## 2019-11-04 MED ORDER — PHENYLEPHRINE 40 MCG/ML (10ML) SYRINGE FOR IV PUSH (FOR BLOOD PRESSURE SUPPORT)
PREFILLED_SYRINGE | INTRAVENOUS | Status: AC
  Filled 2019-11-04: qty 10

## 2019-11-04 MED ORDER — SODIUM CHLORIDE 0.9 % IV SOLN
INTRAVENOUS | Status: DC | PRN
  Administered 2019-11-04: 500 mL

## 2019-11-04 MED ORDER — SODIUM CHLORIDE 0.9 % IV SOLN
INTRAVENOUS | Status: AC
  Filled 2019-11-04: qty 1.2

## 2019-11-04 SURGICAL SUPPLY — 30 items
ARMBAND PINK RESTRICT EXTREMIT (MISCELLANEOUS) ×8 IMPLANT
CANISTER SUCT 3000ML PPV (MISCELLANEOUS) ×4 IMPLANT
CANNULA VESSEL 3MM 2 BLNT TIP (CANNULA) ×4 IMPLANT
CLIP LIGATING EXTRA MED SLVR (CLIP) ×4 IMPLANT
CLIP LIGATING EXTRA SM BLUE (MISCELLANEOUS) ×4 IMPLANT
COVER PROBE W GEL 5X96 (DRAPES) ×4 IMPLANT
COVER WAND RF STERILE (DRAPES) ×4 IMPLANT
DECANTER SPIKE VIAL GLASS SM (MISCELLANEOUS) ×4 IMPLANT
DERMABOND ADVANCED (GAUZE/BANDAGES/DRESSINGS) ×2
DERMABOND ADVANCED .7 DNX12 (GAUZE/BANDAGES/DRESSINGS) ×2 IMPLANT
ELECT REM PT RETURN 9FT ADLT (ELECTROSURGICAL) ×4
ELECTRODE REM PT RTRN 9FT ADLT (ELECTROSURGICAL) ×2 IMPLANT
GAUZE SPONGE 4X4 12PLY STRL (GAUZE/BANDAGES/DRESSINGS) ×4 IMPLANT
GLOVE SS BIOGEL STRL SZ 7.5 (GLOVE) ×2 IMPLANT
GLOVE SUPERSENSE BIOGEL SZ 7.5 (GLOVE) ×2
GOWN STRL REUS W/ TWL LRG LVL3 (GOWN DISPOSABLE) ×6 IMPLANT
GOWN STRL REUS W/TWL LRG LVL3 (GOWN DISPOSABLE) ×6
KIT BASIN OR (CUSTOM PROCEDURE TRAY) ×4 IMPLANT
KIT TURNOVER KIT B (KITS) ×4 IMPLANT
NS IRRIG 1000ML POUR BTL (IV SOLUTION) ×4 IMPLANT
PACK CV ACCESS (CUSTOM PROCEDURE TRAY) ×4 IMPLANT
PAD ARMBOARD 7.5X6 YLW CONV (MISCELLANEOUS) ×8 IMPLANT
SUT PROLENE 6 0 CC (SUTURE) ×12 IMPLANT
SUT SILK 2 0 PERMA HAND 18 BK (SUTURE) IMPLANT
SUT VIC AB 3-0 SH 27 (SUTURE) ×4
SUT VIC AB 3-0 SH 27X BRD (SUTURE) ×4 IMPLANT
SYR TOOMEY 50ML (SYRINGE) IMPLANT
TOWEL GREEN STERILE (TOWEL DISPOSABLE) ×4 IMPLANT
UNDERPAD 30X36 HEAVY ABSORB (UNDERPADS AND DIAPERS) ×4 IMPLANT
WATER STERILE IRR 1000ML POUR (IV SOLUTION) ×4 IMPLANT

## 2019-11-04 NOTE — Anesthesia Procedure Notes (Signed)
Procedure Name: MAC Date/Time: 11/04/2019 7:35 AM Performed by: Moshe Salisbury, CRNA Pre-anesthesia Checklist: Patient identified, Emergency Drugs available, Suction available, Patient being monitored and Timeout performed Patient Re-evaluated:Patient Re-evaluated prior to induction Oxygen Delivery Method: Nasal cannula Dental Injury: Teeth and Oropharynx as per pre-operative assessment

## 2019-11-04 NOTE — Anesthesia Postprocedure Evaluation (Signed)
Anesthesia Post Note  Patient: Lawrence Bonilla  Procedure(s) Performed: left radiocephalic ARTERIOVENOUS (AV) FISTULA CREATION (Left Arm Lower)     Patient location during evaluation: PACU Anesthesia Type: MAC Level of consciousness: awake and alert, patient cooperative and oriented Pain management: pain level controlled Vital Signs Assessment: post-procedure vital signs reviewed and stable Respiratory status: spontaneous breathing, respiratory function stable, nonlabored ventilation and patient connected to nasal cannula oxygen Cardiovascular status: blood pressure returned to baseline and stable Postop Assessment: no apparent nausea or vomiting Anesthetic complications: no    Last Vitals:  Vitals:   11/04/19 0930 11/04/19 0945  BP: (!) 128/55 117/60  Pulse: 68 67  Resp: 17 18  Temp:  (!) 36.2 C  SpO2: 100% 93%    Last Pain:  Vitals:   11/04/19 0930  TempSrc:   PainSc: 0-No pain                 Ranger Petrich,E. Fitzroy Mikami

## 2019-11-04 NOTE — Discharge Instructions (Signed)
° °  [facility] [place] ° °Discharge Instructions ° °AV Fistula or Graft Surgery for Dialysis Access ° °Please refer to the following instructions for your post-procedure care. Your surgeon or physician assistant will discuss any changes with you. ° °Activity ° °You may drive the day following your surgery, if you are comfortable and no longer taking prescription pain medication. Resume full activity as the soreness in your incision resolves. ° °Bathing/Showering ° °You may shower after you go home. Keep your incision dry for 48 hours. Do not soak in a bathtub, hot tub, or swim until the incision heals completely. You may not shower if you have a hemodialysis catheter. ° °Incision Care ° °Clean your incision with mild soap and water after 48 hours. Pat the area dry with a clean towel. You do not need a bandage unless otherwise instructed. Do not apply any ointments or creams to your incision. You may have skin glue on your incision. Do not peel it off. It will come off on its own in about one week. Your arm may swell a bit after surgery. To reduce swelling use pillows to elevate your arm so it is above your heart. Your doctor will tell you if you need to lightly wrap your arm with an ACE bandage. ° °Diet ° °Resume your normal diet. There are not special food restrictions following this procedure. In order to heal from your surgery, it is CRITICAL to get adequate nutrition. Your body requires vitamins, minerals, and protein. Vegetables are the best source of vitamins and minerals. Vegetables also provide the perfect balance of protein. Processed food has little nutritional value, so try to avoid this. ° °Medications ° °Resume taking all of your medications. If your incision is causing pain, you may take over-the counter pain relievers such as acetaminophen (Tylenol). If you were prescribed a stronger pain medication, please be aware these medications can cause nausea and constipation. Prevent  nausea by taking the medication with a snack or meal. Avoid constipation by drinking plenty of fluids and eating foods with high amount of fiber, such as fruits, vegetables, and grains. Do not take Tylenol if you are taking prescription pain medications. ° ° ° ° °Follow up °Your surgeon may want to see you in the office following your access surgery. If so, this will be arranged at the time of your surgery. ° °Please call us immediately for any of the following conditions: ° °Increased pain, redness, drainage (pus) from your incision site °Fever of 101 degrees or higher °Severe or worsening pain at your incision site °Hand pain or numbness. ° °Reduce your risk of vascular disease: ° °Stop smoking. If you would like help, call [facility] at 1-800-QUIT-NOW (1-800-784-8669) or Will at 336-586-4000 ° °Manage your cholesterol °Maintain a desired weight °Control your diabetes °Keep your blood pressure down ° °Dialysis ° °It will take several weeks to several months for your new dialysis access to be ready for use. Your surgeon will determine when it is OK to use it. Your nephrologist will continue to direct your dialysis. You can continue to use your Permcath until your new access is ready for use. ° °If you have any questions, please call the office at [phone]. ° °

## 2019-11-04 NOTE — Transfer of Care (Signed)
Immediate Anesthesia Transfer of Care Note  Patient: Lawrence Bonilla  Procedure(s) Performed: left radiocephalic ARTERIOVENOUS (AV) FISTULA CREATION (Left Arm Lower)  Patient Location: PACU  Anesthesia Type:MAC  Level of Consciousness: awake and patient cooperative  Airway & Oxygen Therapy: Patient Spontanous Breathing and Patient connected to nasal cannula oxygen  Post-op Assessment: Report given to RN and Post -op Vital signs reviewed and stable  Post vital signs: Reviewed and stable  Last Vitals:  Vitals Value Taken Time  BP 122/65 11/04/19 0900  Temp    Pulse 80 11/04/19 0900  Resp 12 11/04/19 0900  SpO2 97 % 11/04/19 0900  Vitals shown include unvalidated device data.  Last Pain:  Vitals:   11/04/19 0617  TempSrc:   PainSc: 0-No pain         Complications: No complications documented.

## 2019-11-04 NOTE — Op Note (Signed)
    OPERATIVE REPORT  DATE OF SURGERY: 11/04/2019  PATIENT: Lawrence Bonilla, 69 y.o. male MRN: [id number]  DOB: 12-18-1950   PRE-OPERATIVE DIAGNOSIS: End-stage renal disease   POST-OPERATIVE DIAGNOSIS:  Same  PROCEDURE: Left radial cephalic AV fistula  SURGEON:  Curt Jews, M.D.  PHYSICIAN ASSISTANT: Arlee Muslim, PA-C  ANESTHESIA: Local with sedation.  EBL: per anesthesia record  Total I/O In: 200 [I.V.:100; IV Piggyback:100] Out: 25 [Blood:25]  BLOOD ADMINISTERED: none  DRAINS: none  SPECIMEN: none  COUNTS CORRECT:  YES  PATIENT DISPOSITION:  PACU - hemodynamically stable  PROCEDURE DETAILS: Patient was taken operating placed supine position where the area of the left arm was prepped draped you sterile fashion.  On physical exam the patient did have a good sized cephalic vein and in looking with SonoSite ultrasound he did have a good cephalic vein throughout the forearm extending into the upper arm.  Using local anesthesia incision was made between the level of the cephalic vein and the radial artery at the wrist.  The vein was ligated distally and divided.  The vein was gently dilated and was of excellent size.  The radial artery was exposed through the same incision.  The radial artery was occluded proximally distally and was opened with 11 blade sent longstanding with Potts scissors.  The vein was cut to the appropriate length and was sewn end-to-side to the artery with a running 6-0 Prolene suture.  Clamps were removed and excellent thrill was noted.  The patient did have a large branch in the mid upper arm and separate incision was made over this.  This large branch was ligated.  The wounds irrigated with saline.  Hemostasis was obtained with electrocautery.  The wounds were closed with 3-0 Vicryl in the subcutaneous and subcuticular tissue.  Sterile dressing was applied and the patient was transferred to the recovery room in stable condition   Lawrence Bonilla, M.D.,  [facility] 11/04/2019 9:41 AM

## 2019-11-04 NOTE — H&P (Signed)
Rosetta Posner, MD  Physician  Specialty:  Vascular Surgery  Progress Notes    Signed  Encounter Date:  08/27/2019          Signed         Show:Clear all [x] Manual[x] Template[] Copied  Added by: [x] Lauran Romanski, Arvilla Meres, MD  [] Hover for details                                     [facility] [place]  Patient name: Lawrence Bonilla      MRN: [id number]        DOB: 20-Oct-1950          Sex: male  REASON FOR CONSULT: Evaluate for access for hemodialysis  HPI: Lawrence Bonilla is a 69 y.o. male, who is here today for discussion of hemodialysis access options.  He is not on hemodialysis and is not felt to be an imminent need for this.  He is class to stage IV.  He is right-handed.  He does not have a pacemaker.  He is not on anticoagulant      Past Medical History:  Diagnosis Date  . Atrial fibrillation ([place])   . CKD (chronic kidney disease) stage 3, GFR 30-59 ml/min   . Congestive heart failure (CHF) ([place])   . DM type 2 (diabetes mellitus, type 2) ([place])   . Essential hypertension   . Hyperlipidemia   . Hypothyroidism   . Lymphedema   . OSA (obstructive sleep apnea)   . PVD (peripheral vascular disease) ([place])   . Secondary cardiomyopathy ([place])   . Vitamin D deficiency disease          Family History  Problem Relation Age of Onset  . Asthma Mother     SOCIAL HISTORY: Social History        Socioeconomic History  . Marital status: Married    Spouse name: Not on file  . Number of children: Not on file  . Years of education: Not on file  . Highest education level: Not on file  Occupational History  . Not on file  Tobacco Use  . Smoking status: Never Smoker  . Smokeless tobacco: Never Used  Substance and Sexual Activity  . Alcohol use: Yes    Comment: 2 SCOTCH   . Drug use: Never  . Sexual activity: Yes    Partners: Female    Comment: MARRIED FOR 40 YEARS   Other Topics Concern  . 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   Social Determinants of Health      Financial Resource Strain:   . Difficulty of Paying Living Expenses:   Food Insecurity:   . Worried About Charity fundraiser in the Last Year:   . Arboriculturist in the Last Year:   Transportation Needs:   . Film/video editor (Medical):   Marland Kitchen Lack of Transportation (Non-Medical):   Physical Activity:   . Days of Exercise per Week:   . Minutes of Exercise per Session:   Stress:   . Feeling of Stress :   Social Connections:   . Frequency of Communication with Friends and Family:   . Frequency of Social Gatherings with Friends and Family:   . Attends Religious Services:   . Active Member of Clubs or Organizations:   .  Attends Archivist Meetings:   Marland Kitchen Marital Status:   Intimate Partner Violence:   . Fear of Current or Ex-Partner:   . Emotionally Abused:   Marland Kitchen Physically Abused:   . Sexually Abused:     No Known Allergies        Current Outpatient Medications  Medication Sig Dispense Refill  . apixaban (ELIQUIS) 5 MG TABS tablet Take 1 tablet (5 mg total) by mouth 2 (two) times daily. 60 tablet 3  . atorvastatin (LIPITOR) 40 MG tablet Take 1 tablet (40 mg total) by mouth daily. 90 tablet 0  . calcitRIOL (ROCALTROL) 0.25 MCG capsule Take 1 capsule (0.25 mcg total) by mouth daily. 30 capsule 3  . CALCIUM PO Take 600 mg by mouth daily. Vitamin D3    . Ferrous Sulfate (IRON PO) Take 65 mg by mouth daily.     . furosemide (LASIX) 40 MG tablet Take 2 tablets (80 mg total) by mouth 2 (two) times daily. 120 tablet 3  . hydrALAZINE (APRESOLINE) 10 MG tablet Take 1 tablet (10 mg total) by mouth 2 (two) times daily. 60 tablet 4  . isosorbide mononitrate (IMDUR) 30 MG 24 hr tablet TAKE 1 TABLET(30 MG) BY MOUTH DAILY 90 tablet 0  . loperamide (IMODIUM A-D) 2 MG tablet Take 1 tablet (2 mg total) by mouth 4  (four) times daily as needed for diarrhea or loose stools. 30 tablet 0  . metolazone (ZAROXOLYN) 2.5 MG tablet TAKE 1 TABLET BY MOUTH DAILY ON SATURDAY AND SUNDAY TO GET WEIGHT DOWN 5-6 LBS 25 tablet 1  . metoprolol succinate (TOPROL-XL) 50 MG 24 hr tablet Take 1 tablet (50 mg total) by mouth daily. Take with or immediately following a meal. 30 tablet 5   No current facility-administered medications for this visit.    REVIEW OF SYSTEMS:  [X]  denotes positive finding, [ ]  denotes negative finding Cardiac  Comments:  Chest pain or chest pressure:    Shortness of breath upon exertion:    Short of breath when lying flat:    Irregular heart rhythm:        Vascular    Pain in calf, thigh, or hip brought on by ambulation:    Pain in feet at night that wakes you up from your sleep:     Blood clot in your veins:    Leg swelling:         Pulmonary    Oxygen at home:    Productive cough:     Wheezing:         Neurologic    Sudden weakness in arms or legs:     Sudden numbness in arms or legs:     Sudden onset of difficulty speaking or slurred speech:    Temporary loss of vision in one eye:     Problems with dizziness:         Gastrointestinal    Blood in stool:     Vomited blood:         Genitourinary    Burning when urinating:     Blood in urine:        Psychiatric    Major depression:         Hematologic    Bleeding problems:    Problems with blood clotting too easily:        Skin    Rashes or ulcers:        Constitutional    Fever or chills:  PHYSICAL EXAM:    Vitals:   08/27/19 0835  BP: 140/90  Pulse: 80  Resp: 20  SpO2: 93%  Weight: 190 lb (86.2 kg)  Height: 6' (1.829 m)    GENERAL: The patient is a well-nourished male, in no acute distress. The vital signs are documented above. CARDIOVASCULAR: Palpable brachial and radial pulses bilaterally.   Extremely small surface veins bilaterally PULMONARY: There is good air exchange  ABDOMEN: Soft and non-tender  MUSCULOSKELETAL: There are no major deformities or cyanosis. NEUROLOGIC: No focal weakness or paresthesias are detected. SKIN: There are no ulcers or rashes noted. PSYCHIATRIC: The patient has a normal affect.  DATA:  Upper extremity studies revealed normal arterial flow.  His venous studies reveal extremely small cephalic and basilic veins bilaterally  MEDICAL ISSUES: Had long discussion with the patient regarding access for hemodialysis.  I discussed the options of tunneled catheter, AV fistula and AV graft.  He is not a candidate for fistula creation due to extremely small surface veins bilaterally.  I would recommend a left arm AV Gore-Tex graft as his initial access option.  Agree to reserve this until he is in more eminent need for hemodialysis.  We are available to proceed with this as indicated if his renal function deteriorates   Rosetta Posner, MD [facility] [phone] Pager [phone]           Addendum:  The patient has been re-examined and re-evaluated.  The patient's history and physical has been reviewed and is unchanged.    Lawrence Bonilla is a 69 y.o. male is being admitted with CHRONIC KIDNEY DISEASE STAGE 4. All the risks, benefits and other treatment options have been discussed with the patient. The patient has consented to proceed with Procedure(s): INSERTION OF ARTERIOVENOUS (AV) GORE-TEX GRAFT ARM as a surgical intervention.  Lawrence Bonilla 11/04/2019 7:25 AM Vascular and Vein Surgery

## 2019-11-05 ENCOUNTER — Encounter (HOSPITAL_COMMUNITY): Payer: Self-pay | Admitting: Vascular Surgery

## 2019-11-05 DIAGNOSIS — Z992 Dependence on renal dialysis: Secondary | ICD-10-CM | POA: Diagnosis not present

## 2019-11-05 DIAGNOSIS — N186 End stage renal disease: Secondary | ICD-10-CM | POA: Diagnosis not present

## 2019-11-05 DIAGNOSIS — N2581 Secondary hyperparathyroidism of renal origin: Secondary | ICD-10-CM | POA: Diagnosis not present

## 2019-11-07 ENCOUNTER — Encounter (HOSPITAL_COMMUNITY): Payer: Medicare HMO | Admitting: Internal Medicine

## 2019-11-07 ENCOUNTER — Other Ambulatory Visit (INDEPENDENT_AMBULATORY_CARE_PROVIDER_SITE_OTHER): Payer: Self-pay

## 2019-11-07 DIAGNOSIS — Z992 Dependence on renal dialysis: Secondary | ICD-10-CM | POA: Diagnosis not present

## 2019-11-07 DIAGNOSIS — N186 End stage renal disease: Secondary | ICD-10-CM | POA: Diagnosis not present

## 2019-11-07 DIAGNOSIS — N2581 Secondary hyperparathyroidism of renal origin: Secondary | ICD-10-CM | POA: Diagnosis not present

## 2019-11-07 MED ORDER — METOPROLOL SUCCINATE ER 50 MG PO TB24: 50.0000 mg | ORAL_TABLET | Freq: Every day | ORAL | 0 refills | Status: DC

## 2019-11-09 DIAGNOSIS — N2581 Secondary hyperparathyroidism of renal origin: Secondary | ICD-10-CM | POA: Diagnosis not present

## 2019-11-09 DIAGNOSIS — N186 End stage renal disease: Secondary | ICD-10-CM | POA: Diagnosis not present

## 2019-11-09 DIAGNOSIS — Z992 Dependence on renal dialysis: Secondary | ICD-10-CM | POA: Diagnosis not present

## 2019-11-10 DIAGNOSIS — S81822A Laceration with foreign body, left lower leg, initial encounter: Secondary | ICD-10-CM | POA: Diagnosis not present

## 2019-11-10 DIAGNOSIS — W228XXA Striking against or struck by other objects, initial encounter: Secondary | ICD-10-CM | POA: Diagnosis not present

## 2019-11-10 DIAGNOSIS — S81812A Laceration without foreign body, left lower leg, initial encounter: Secondary | ICD-10-CM | POA: Diagnosis not present

## 2019-11-10 DIAGNOSIS — Z23 Encounter for immunization: Secondary | ICD-10-CM | POA: Diagnosis not present

## 2019-11-12 DIAGNOSIS — N186 End stage renal disease: Secondary | ICD-10-CM | POA: Diagnosis not present

## 2019-11-12 DIAGNOSIS — Z992 Dependence on renal dialysis: Secondary | ICD-10-CM | POA: Diagnosis not present

## 2019-11-12 DIAGNOSIS — N2581 Secondary hyperparathyroidism of renal origin: Secondary | ICD-10-CM | POA: Diagnosis not present

## 2019-11-14 DIAGNOSIS — N2581 Secondary hyperparathyroidism of renal origin: Secondary | ICD-10-CM | POA: Diagnosis not present

## 2019-11-14 DIAGNOSIS — Z992 Dependence on renal dialysis: Secondary | ICD-10-CM | POA: Diagnosis not present

## 2019-11-14 DIAGNOSIS — N186 End stage renal disease: Secondary | ICD-10-CM | POA: Diagnosis not present

## 2019-11-16 DIAGNOSIS — N186 End stage renal disease: Secondary | ICD-10-CM | POA: Diagnosis not present

## 2019-11-16 DIAGNOSIS — N2581 Secondary hyperparathyroidism of renal origin: Secondary | ICD-10-CM | POA: Diagnosis not present

## 2019-11-16 DIAGNOSIS — Z992 Dependence on renal dialysis: Secondary | ICD-10-CM | POA: Diagnosis not present

## 2019-11-19 DIAGNOSIS — Z992 Dependence on renal dialysis: Secondary | ICD-10-CM | POA: Diagnosis not present

## 2019-11-19 DIAGNOSIS — G4733 Obstructive sleep apnea (adult) (pediatric): Secondary | ICD-10-CM | POA: Diagnosis not present

## 2019-11-19 DIAGNOSIS — N2581 Secondary hyperparathyroidism of renal origin: Secondary | ICD-10-CM | POA: Diagnosis not present

## 2019-11-19 DIAGNOSIS — N186 End stage renal disease: Secondary | ICD-10-CM | POA: Diagnosis not present

## 2019-11-20 DIAGNOSIS — I129 Hypertensive chronic kidney disease with stage 1 through stage 4 chronic kidney disease, or unspecified chronic kidney disease: Secondary | ICD-10-CM | POA: Diagnosis not present

## 2019-11-20 DIAGNOSIS — N186 End stage renal disease: Secondary | ICD-10-CM | POA: Diagnosis not present

## 2019-11-20 DIAGNOSIS — Z992 Dependence on renal dialysis: Secondary | ICD-10-CM | POA: Diagnosis not present

## 2019-11-21 DIAGNOSIS — N2581 Secondary hyperparathyroidism of renal origin: Secondary | ICD-10-CM | POA: Diagnosis not present

## 2019-11-21 DIAGNOSIS — Z992 Dependence on renal dialysis: Secondary | ICD-10-CM | POA: Diagnosis not present

## 2019-11-21 DIAGNOSIS — N186 End stage renal disease: Secondary | ICD-10-CM | POA: Diagnosis not present

## 2019-11-23 DIAGNOSIS — N2581 Secondary hyperparathyroidism of renal origin: Secondary | ICD-10-CM | POA: Diagnosis not present

## 2019-11-23 DIAGNOSIS — Z992 Dependence on renal dialysis: Secondary | ICD-10-CM | POA: Diagnosis not present

## 2019-11-23 DIAGNOSIS — N186 End stage renal disease: Secondary | ICD-10-CM | POA: Diagnosis not present

## 2019-11-26 ENCOUNTER — Other Ambulatory Visit (INDEPENDENT_AMBULATORY_CARE_PROVIDER_SITE_OTHER): Payer: Self-pay

## 2019-11-26 DIAGNOSIS — N2581 Secondary hyperparathyroidism of renal origin: Secondary | ICD-10-CM | POA: Diagnosis not present

## 2019-11-26 DIAGNOSIS — Z992 Dependence on renal dialysis: Secondary | ICD-10-CM | POA: Diagnosis not present

## 2019-11-26 DIAGNOSIS — N186 End stage renal disease: Secondary | ICD-10-CM | POA: Diagnosis not present

## 2019-11-26 MED ORDER — APIXABAN 5 MG PO TABS: 5.0000 mg | ORAL_TABLET | Freq: Two times a day (BID) | ORAL | 3 refills | Status: DC

## 2019-11-26 MED ORDER — THYROID 60 MG PO TABS: 60.0000 mg | ORAL_TABLET | Freq: Every day | ORAL | 0 refills | Status: DC

## 2019-11-26 NOTE — Telephone Encounter (Signed)
Lawrence Bonilla Is calling stating that he has misplaced his Eiliquis and he needs a new Rx sent in to [facility] on [address] he spoke to his insurance company and is aware he will have to pay full price

## 2019-11-27 ENCOUNTER — Telehealth (INDEPENDENT_AMBULATORY_CARE_PROVIDER_SITE_OTHER): Payer: Self-pay

## 2019-11-28 DIAGNOSIS — Z992 Dependence on renal dialysis: Secondary | ICD-10-CM | POA: Diagnosis not present

## 2019-11-28 DIAGNOSIS — N186 End stage renal disease: Secondary | ICD-10-CM | POA: Diagnosis not present

## 2019-11-28 DIAGNOSIS — N2581 Secondary hyperparathyroidism of renal origin: Secondary | ICD-10-CM | POA: Diagnosis not present

## 2019-11-28 NOTE — Telephone Encounter (Signed)
Yes I would rather him stay on desiccated Armour Thyroid if possible so good Rx coupon would be helpful.

## 2019-11-28 NOTE — Telephone Encounter (Signed)
WILL FAX A CAR OVER FOR THE REFILL

## 2019-11-28 NOTE — Telephone Encounter (Signed)
Nellie sent me this message that insurance requested alternative  Thyroid medication as Armour thyroid is not the preferred agent. Do you want her to offer him the good Rx coupon?

## 2019-11-28 NOTE — Telephone Encounter (Signed)
Nellie, please call this patient and let him know that Dr. Anastasio Champion would prefer for him to stay on the Armour Thyroid if possible.  We can offer him a good Rx coupon to make it more affordable.  If the patient is unwilling to do this we can change to levothyroxine.  Please let Dr. Anastasio Champion know what the patient decides.  Thank you.

## 2019-11-30 DIAGNOSIS — N186 End stage renal disease: Secondary | ICD-10-CM | POA: Diagnosis not present

## 2019-11-30 DIAGNOSIS — N2581 Secondary hyperparathyroidism of renal origin: Secondary | ICD-10-CM | POA: Diagnosis not present

## 2019-11-30 DIAGNOSIS — Z992 Dependence on renal dialysis: Secondary | ICD-10-CM | POA: Diagnosis not present

## 2019-12-03 ENCOUNTER — Other Ambulatory Visit: Payer: Self-pay

## 2019-12-03 DIAGNOSIS — N184 Chronic kidney disease, stage 4 (severe): Secondary | ICD-10-CM

## 2019-12-03 DIAGNOSIS — Z992 Dependence on renal dialysis: Secondary | ICD-10-CM | POA: Diagnosis not present

## 2019-12-03 DIAGNOSIS — N2581 Secondary hyperparathyroidism of renal origin: Secondary | ICD-10-CM | POA: Diagnosis not present

## 2019-12-03 DIAGNOSIS — N186 End stage renal disease: Secondary | ICD-10-CM | POA: Diagnosis not present

## 2019-12-05 DIAGNOSIS — N186 End stage renal disease: Secondary | ICD-10-CM | POA: Diagnosis not present

## 2019-12-05 DIAGNOSIS — N2581 Secondary hyperparathyroidism of renal origin: Secondary | ICD-10-CM | POA: Diagnosis not present

## 2019-12-05 DIAGNOSIS — Z992 Dependence on renal dialysis: Secondary | ICD-10-CM | POA: Diagnosis not present

## 2019-12-07 DIAGNOSIS — N2581 Secondary hyperparathyroidism of renal origin: Secondary | ICD-10-CM | POA: Diagnosis not present

## 2019-12-07 DIAGNOSIS — Z992 Dependence on renal dialysis: Secondary | ICD-10-CM | POA: Diagnosis not present

## 2019-12-07 DIAGNOSIS — N186 End stage renal disease: Secondary | ICD-10-CM | POA: Diagnosis not present

## 2019-12-09 ENCOUNTER — Telehealth: Payer: Self-pay

## 2019-12-09 DIAGNOSIS — N186 End stage renal disease: Secondary | ICD-10-CM | POA: Diagnosis not present

## 2019-12-09 DIAGNOSIS — Z992 Dependence on renal dialysis: Secondary | ICD-10-CM | POA: Diagnosis not present

## 2019-12-09 DIAGNOSIS — N2581 Secondary hyperparathyroidism of renal origin: Secondary | ICD-10-CM | POA: Diagnosis not present

## 2019-12-09 NOTE — Telephone Encounter (Signed)
Telephone call received from pt requesting instructions for appt on 7/20. Attempted to reach pt, no answer. LMOM advising to arrive for appt tomorrow, 7/20 by 1:45PM and no restrictions or additional instructions. Advised to contact office back for any additional questions.

## 2019-12-10 ENCOUNTER — Other Ambulatory Visit: Payer: Self-pay

## 2019-12-10 ENCOUNTER — Ambulatory Visit (HOSPITAL_COMMUNITY)
Admission: RE | Admit: 2019-12-10 | Discharge: 2019-12-10 | Disposition: A | Discharge status: Home / Self Care | Payer: Medicare HMO | Source: Ambulatory Visit | Attending: Vascular Surgery | Admitting: Vascular Surgery

## 2019-12-10 ENCOUNTER — Ambulatory Visit (INDEPENDENT_AMBULATORY_CARE_PROVIDER_SITE_OTHER): Payer: Self-pay | Admitting: Physician Assistant

## 2019-12-10 VITALS — BP 111/69 | HR 63 | Temp 97.6°F | Resp 20 | Ht 72.0 in | Wt 165.3 lb

## 2019-12-10 DIAGNOSIS — N184 Chronic kidney disease, stage 4 (severe): Secondary | ICD-10-CM

## 2019-12-10 DIAGNOSIS — N186 End stage renal disease: Secondary | ICD-10-CM

## 2019-12-10 DIAGNOSIS — Z992 Dependence on renal dialysis: Secondary | ICD-10-CM

## 2019-12-10 NOTE — Progress Notes (Signed)
POST OPERATIVE OFFICE NOTE    CC:  F/u for surgery  HPI:  This is a 69 y.o. male who is s/p RC AVF on 11/04/2019 by Dr. Donnetta Hutching.    Pt returns today for follow up.  He has started HD since his fistula was created at [facility] in [place] on T/T/S.  He denies any pain in his left hand.    No Known Allergies  Current Outpatient Medications  Medication Sig Dispense Refill  . apixaban (ELIQUIS) 5 MG TABS tablet Take 1 tablet (5 mg total) by mouth 2 (two) times daily. 60 tablet 3  . AURYXIA 1 GM 210 MG(Fe) tablet Take 210 mg by mouth 3 (three) times daily.    . calcitRIOL (ROCALTROL) 0.25 MCG capsule Take 1 capsule (0.25 mcg total) by mouth daily. 30 capsule 3  . Calcium Carb-Cholecalciferol (CALCIUM 600 + D PO) Take 1 tablet by mouth daily.    . ferrous sulfate 325 (65 FE) MG tablet Take 325 mg by mouth daily with breakfast.    . furosemide (LASIX) 40 MG tablet Take 2 tablets (80 mg total) by mouth 2 (two) times daily. (Patient taking differently: Take 40 mg by mouth daily. ) 120 tablet 3  . heparin 1000 unit/mL SOLN injection Heparin Sodium (Porcine) 1,000 Units/mL Catheter Lock Arterial    . hydrALAZINE (APRESOLINE) 10 MG tablet Take 1 tablet (10 mg total) by mouth 2 (two) times daily. 60 tablet 4  . HYDROcodone-acetaminophen (NORCO) 5-325 MG tablet Take 1 tablet by mouth every 6 (six) hours as needed for moderate pain. 10 tablet 0  . isosorbide mononitrate (IMDUR) 30 MG 24 hr tablet TAKE 1 TABLET(30 MG) BY MOUTH DAILY (Patient taking differently: Take 30 mg by mouth daily. ) 90 tablet 0  . Methoxy PEG-Epoetin Beta (MIRCERA IJ) Mircera    . metolazone (ZAROXOLYN) 2.5 MG tablet TAKE 1 TABLET BY MOUTH DAILY ON SATURDAY AND SUNDAY TO GET WEIGHT DOWN 5-6 LBS 25 tablet 1  . metoprolol succinate (TOPROL-XL) 50 MG 24 hr tablet Take 1 tablet (50 mg total) by mouth daily. Take with or immediately following a meal. 90 tablet 0  . thyroid (ARMOUR) 60 MG tablet Take 1 tablet (60 mg total) by mouth  daily before breakfast. 90 tablet 0  . VITAMIN D PO Take by mouth.    Marland Kitchen atorvastatin (LIPITOR) 40 MG tablet Take 1 tablet (40 mg total) by mouth daily. (Patient not taking: Reported on 10/28/2019) 90 tablet 0   No current facility-administered medications for this visit.     ROS:  See HPI  Physical Exam:  Today's Vitals   12/10/19 1614  BP: 111/69  Pulse: 63  Resp: 20  Temp: 97.6 F (36.4 C)  TempSrc: Temporal  SpO2: 100%  Weight: 165 lb 4.8 oz (75 kg)  Height: 6' (1.829 m)   Body mass index is 22.42 kg/m.   Incision:  Healed nicely. Extremities:  There is an excellent thrill in the fistula and easy to palpate.  Motor and sensory are in tact left hand. His left radial artery is easily palpable.   Dialysis duplex 12/10/2019: +------------+----------+-------------+----------+-------------+  OUTFLOW VEINPSV (cm/s)Diameter (cm)Depth (cm) Describe    +------------+----------+-------------+----------+-------------+  Prox Forearm  141    0.45     0.56  branch 0.452   +------------+----------+-------------+----------+-------------+  Mid Forearm   207    0.49     0.72  branch 0.42cm  +------------+----------+-------------+----------+-------------+  Dist Forearm  440    0.30     0.62           +------------+----------+-------------+----------+-------------+  Assessment/Plan:  This is a 69 y.o. male who is s/p: RC AVF on 11/04/2019 by Dr. Donnetta Hutching.     -pt now on dialysis TTS at [facility] in [place].  His catheter is working well.  This was put in by [facility].  -his fistula is slow to mature and does have a couple of branches.   Discussed with Dr. Donnetta Hutching and will have pt return in 6 weeks with repeat duplex to evaluate maturity.  Discussed with pt use squeeze ball to exercise to help maturation.   -discussed with him that he may need a branch ligation if it does not mature.  He understands that the fistula will need  maintenance over the life of the fistula and will not last forever and most likely will require new access at some point.  He expresses understanding.  -pt is on Eliquis  Leontine Locket, [facility] [phone]  Clinic MD:  Early

## 2019-12-12 ENCOUNTER — Other Ambulatory Visit: Payer: Self-pay

## 2019-12-12 DIAGNOSIS — N2581 Secondary hyperparathyroidism of renal origin: Secondary | ICD-10-CM | POA: Diagnosis not present

## 2019-12-12 DIAGNOSIS — Z992 Dependence on renal dialysis: Secondary | ICD-10-CM | POA: Diagnosis not present

## 2019-12-12 DIAGNOSIS — N186 End stage renal disease: Secondary | ICD-10-CM | POA: Diagnosis not present

## 2019-12-14 DIAGNOSIS — Z992 Dependence on renal dialysis: Secondary | ICD-10-CM | POA: Diagnosis not present

## 2019-12-14 DIAGNOSIS — N186 End stage renal disease: Secondary | ICD-10-CM | POA: Diagnosis not present

## 2019-12-14 DIAGNOSIS — N2581 Secondary hyperparathyroidism of renal origin: Secondary | ICD-10-CM | POA: Diagnosis not present

## 2019-12-16 ENCOUNTER — Other Ambulatory Visit: Payer: Self-pay | Admitting: *Deleted

## 2019-12-16 DIAGNOSIS — Z992 Dependence on renal dialysis: Secondary | ICD-10-CM

## 2019-12-17 ENCOUNTER — Other Ambulatory Visit: Payer: Self-pay

## 2019-12-17 DIAGNOSIS — N184 Chronic kidney disease, stage 4 (severe): Secondary | ICD-10-CM

## 2019-12-17 DIAGNOSIS — Z992 Dependence on renal dialysis: Secondary | ICD-10-CM | POA: Diagnosis not present

## 2019-12-17 DIAGNOSIS — N2581 Secondary hyperparathyroidism of renal origin: Secondary | ICD-10-CM | POA: Diagnosis not present

## 2019-12-17 DIAGNOSIS — N186 End stage renal disease: Secondary | ICD-10-CM | POA: Diagnosis not present

## 2019-12-19 DIAGNOSIS — Z992 Dependence on renal dialysis: Secondary | ICD-10-CM | POA: Diagnosis not present

## 2019-12-19 DIAGNOSIS — G4733 Obstructive sleep apnea (adult) (pediatric): Secondary | ICD-10-CM | POA: Diagnosis not present

## 2019-12-19 DIAGNOSIS — N2581 Secondary hyperparathyroidism of renal origin: Secondary | ICD-10-CM | POA: Diagnosis not present

## 2019-12-19 DIAGNOSIS — N186 End stage renal disease: Secondary | ICD-10-CM | POA: Diagnosis not present

## 2019-12-21 ENCOUNTER — Other Ambulatory Visit (INDEPENDENT_AMBULATORY_CARE_PROVIDER_SITE_OTHER): Payer: Self-pay | Admitting: Internal Medicine

## 2019-12-21 DIAGNOSIS — N186 End stage renal disease: Secondary | ICD-10-CM | POA: Diagnosis not present

## 2019-12-21 DIAGNOSIS — Z992 Dependence on renal dialysis: Secondary | ICD-10-CM | POA: Diagnosis not present

## 2019-12-21 DIAGNOSIS — I129 Hypertensive chronic kidney disease with stage 1 through stage 4 chronic kidney disease, or unspecified chronic kidney disease: Secondary | ICD-10-CM | POA: Diagnosis not present

## 2019-12-21 DIAGNOSIS — N2581 Secondary hyperparathyroidism of renal origin: Secondary | ICD-10-CM | POA: Diagnosis not present

## 2019-12-23 ENCOUNTER — Other Ambulatory Visit: Payer: Self-pay

## 2019-12-23 ENCOUNTER — Ambulatory Visit (INDEPENDENT_AMBULATORY_CARE_PROVIDER_SITE_OTHER): Payer: Medicare HMO | Admitting: Cardiology

## 2019-12-23 ENCOUNTER — Encounter: Payer: Self-pay | Admitting: Cardiology

## 2019-12-23 VITALS — BP 98/60 | HR 57 | Ht 72.0 in | Wt 168.6 lb

## 2019-12-23 DIAGNOSIS — I4821 Permanent atrial fibrillation: Secondary | ICD-10-CM | POA: Diagnosis not present

## 2019-12-23 DIAGNOSIS — N185 Chronic kidney disease, stage 5: Secondary | ICD-10-CM

## 2019-12-23 DIAGNOSIS — I5042 Chronic combined systolic (congestive) and diastolic (congestive) heart failure: Secondary | ICD-10-CM | POA: Diagnosis not present

## 2019-12-23 NOTE — Progress Notes (Signed)
Cardiology Office Note  Date: 12/23/2019   ID: Lawrence Bonilla, DOB 01/23/1951, MRN [id number]  PCP:  Lawrence Ards, NP  Cardiologist:  Lawrence Lesches, MD Electrophysiologist:  None   Chief Complaint  Patient presents with  . Cardiac follow-up    History of Present Illness: Lawrence Bonilla is a 69 y.o. male last seen in April.  He presents for a routine visit.  He does not report any progressive shortness of breath with typical ADLs, his weight has been stable. He did miss his follow-up visit in the Heart Failure clinic for June.  Per chart review he has pending follow-up with Dr. Donnetta Bonilla for discussion of creation of AV fistula in anticipation of hemodialysis ultimately.  He is also due to see his PCP Dr. Anastasio Bonilla.  I reviewed his medications which are stable from a cardiac perspective.  It does not look like he is taking any thyroid replacement therapy at this time, states that he got a letter from his insurance company that told him to stop taking what he had previously been on.  I asked him to address this with Dr. Anastasio Bonilla.  Follow-up lab work from June is outlined below.  Past Medical History:  Diagnosis Date  . Atrial fibrillation ([place])   . CKD (chronic kidney disease) stage 3, GFR 30-59 ml/min   . Congestive heart failure (CHF) ([place])   . DM type 2 (diabetes mellitus, type 2) ([place])   . Essential hypertension   . Headache   . Hyperlipidemia   . Hypothyroidism   . Lymphedema   . OSA on CPAP   . PE (pulmonary thromboembolism) ([place])    High probability for PE on VQ scan 01/29/19  . PVD (peripheral vascular disease) ([place])   . Secondary cardiomyopathy ([place])   . Stroke ([place])   . Vitamin D deficiency disease   . Wears glasses     Past Surgical History:  Procedure Laterality Date  . APPENDECTOMY    . AV FISTULA PLACEMENT Left 11/04/2019   Procedure: left radiocephalic ARTERIOVENOUS (AV) FISTULA CREATION;  Surgeon: Rosetta Posner, MD;  Location: [place];  Service: Vascular;   Laterality: Left;  . BARIATRIC SURGERY    . CATARACT EXTRACTION W/ INTRAOCULAR LENS  IMPLANT, BILATERAL    . HERNIA REPAIR    . RIGHT HEART CATH N/A 05/02/2019   Procedure: RIGHT HEART CATH;  Surgeon: Jolaine Artist, MD;  Location: [facility];  Service: Cardiovascular;  Laterality: N/A;  . TONSILLECTOMY    . WISDOM TOOTH EXTRACTION      Current Outpatient Medications  Medication Sig Dispense Refill  . apixaban (ELIQUIS) 5 MG TABS tablet Take 1 tablet (5 mg total) by mouth 2 (two) times daily. 60 tablet 3  . atorvastatin (LIPITOR) 40 MG tablet Take 1 tablet (40 mg total) by mouth daily. 90 tablet 0  . AURYXIA 1 GM 210 MG(Fe) tablet Take 210 mg by mouth 3 (three) times daily.    . calcitRIOL (ROCALTROL) 0.25 MCG capsule Take 1 capsule (0.25 mcg total) by mouth daily. 30 capsule 3  . Calcium Carb-Cholecalciferol (CALCIUM 600 + D PO) Take 1 tablet by mouth daily.     . furosemide (LASIX) 40 MG tablet Take 2 tablets (80 mg total) by mouth 2 (two) times daily. 120 tablet 3  . heparin 1000 unit/mL SOLN injection Heparin Sodium (Porcine) 1,000 Units/mL Catheter Lock Arterial    . hydrALAZINE (APRESOLINE) 10 MG tablet TAKE 1 TABLET(10 MG) BY  MOUTH TWICE DAILY 60 tablet 2  . HYDROcodone-acetaminophen (NORCO) 5-325 MG tablet Take 1 tablet by mouth every 6 (six) hours as needed for moderate pain. 10 tablet 0  . isosorbide mononitrate (IMDUR) 30 MG 24 hr tablet TAKE 1 TABLET(30 MG) BY MOUTH DAILY 90 tablet 0  . Methoxy PEG-Epoetin Beta (MIRCERA IJ) Mircera    . metoprolol succinate (TOPROL-XL) 50 MG 24 hr tablet Take 1 tablet (50 mg total) by mouth daily. Take with or immediately following a meal. 90 tablet 0  . VITAMIN D PO Take by mouth.     No current facility-administered medications for this visit.   Allergies:  Patient has no known allergies.   ROS:   No orthopnea or PND.  Physical Exam: VS:  BP 98/60   Pulse (!) 57   Ht 6' (1.829 m)   Wt 168 lb 9.6 oz (76.5 kg)   SpO2 98%    BMI 22.87 kg/m , BMI Body mass index is 22.87 kg/m.  Wt Readings from Last 3 Encounters:  12/23/19 168 lb 9.6 oz (76.5 kg)  12/10/19 165 lb 4.8 oz (75 kg)  11/04/19 168 lb (76.2 kg)    General: Chronically ill-appearing male, appears comfortable at rest. HEENT: Conjunctiva and lids normal, wearing a mask. Neck: Supple, no elevated JVP or carotid bruits, no thyromegaly. Lungs: Clear without wheezing, nonlabored breathing at rest. Cardiac: Irregularly irregular, no S3 or significant systolic murmur, no pericardial rub. Abdomen: Soft, bowel sounds present. Extremities: Chronic appearing lymphedema, generally improved..  ECG:  An ECG dated 05/02/2019 was personally reviewed today and demonstrated:  Rate controlled atrial fibrillation with low voltage in the limb leads, nonspecific ST-T changes.  Recent Labwork: 01/28/2019: Magnesium 2.4 04/26/2019: B Natriuretic Peptide >4,500.0; Platelets 91 09/09/2019: ALT 14; AST 22; TSH 61.65 11/04/2019: BUN 59; Creatinine, Ser 4.30; Hemoglobin 8.5; Potassium 3.6; Sodium 141   Other Studies Reviewed Today:  Right heart catheterization 05/02/2019: Findings:  RA = 18 RV = 66/14 PA = 69/30 (42) PCW = 24 Fick cardiac output/index = 4.1/1.9 Thermo CO/CI = 4.0/1.8 PVR = 4.5 WU Ao sat = 99% PA sat = 55%, 56%  Echocardiogram 01/10/2019: 1. The left ventricle has severely reduced systolic function, with an  ejection fraction of 25-30%. The cavity size was mildly dilated. Left  ventricular diastolic Doppler parameters are indeterminate.  2. The right ventricle has mildly reduced systolic function. The cavity  was moderately enlarged. There is no increase in right ventricular wall  thickness.  3. Left atrial size was severely dilated.  4. Right atrial size was severely dilated.  5. No evidence of mitral valve stenosis.  6. Tricuspid valve regurgitation is moderate.  7. The aortic valve is tricuspid. No stenosis of the aortic valve.  8.  The aorta is normal unless otherwise noted.  9. The aortic root is normal in size and structure.  10. Pulmonary hypertension is moderately elevated, PASP is 63 mmHg.   Assessment and Plan:  1.  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  LVEF approximately 25 to 30% by most recent assessment.  Weight has been stable on current regimen.  Continue hydralazine, Imdur, Toprol-XL, and Lasix.  Recent lab work reviewed.  2.  CKD stage V.  He continues to follow with Nephrology and is contemplating creation of AV fistula, visit scheduled with Dr. Donnetta Bonilla.  3.  Permanent atrial fibrillation.  He reports no palpitations, heart rate control is adequate on Toprol-XL.  Continue Eliquis for stroke prophylaxis.  Medication Adjustments/Labs and Tests Ordered: Current medicines are reviewed at length with the patient today.  Concerns regarding medicines are outlined above.   Tests Ordered: No orders of the defined types were placed in this encounter.   Medication Changes: No orders of the defined types were placed in this encounter.   Disposition:  Follow up 3 months in the [place] office.  Signed, [name], MD, [facility] 12/23/2019 11:52 AM    [facility] [place], [place], [place] [address] Phone: [phone]; Fax: [phone]

## 2019-12-23 NOTE — Patient Instructions (Addendum)
Medication Instructions:   Your physician recommends that you continue on your current medications as directed. Please refer to the Current Medication list given to you today.  Labwork:  NONE  Testing/Procedures:  NONE  Follow-Up:  Your physician recommends that you schedule a follow-up appointment in: 3 months.  Any Other Special Instructions Will Be Listed Below (If Applicable).  If you need a refill on your cardiac medications before your next appointment, please call your pharmacy. 

## 2019-12-25 DIAGNOSIS — N2581 Secondary hyperparathyroidism of renal origin: Secondary | ICD-10-CM | POA: Diagnosis not present

## 2019-12-25 DIAGNOSIS — Z992 Dependence on renal dialysis: Secondary | ICD-10-CM | POA: Diagnosis not present

## 2019-12-25 DIAGNOSIS — N186 End stage renal disease: Secondary | ICD-10-CM | POA: Diagnosis not present

## 2019-12-26 ENCOUNTER — Other Ambulatory Visit (INDEPENDENT_AMBULATORY_CARE_PROVIDER_SITE_OTHER): Payer: Self-pay | Admitting: Internal Medicine

## 2019-12-26 DIAGNOSIS — N186 End stage renal disease: Secondary | ICD-10-CM | POA: Diagnosis not present

## 2019-12-26 DIAGNOSIS — Z992 Dependence on renal dialysis: Secondary | ICD-10-CM | POA: Diagnosis not present

## 2019-12-26 DIAGNOSIS — N2581 Secondary hyperparathyroidism of renal origin: Secondary | ICD-10-CM | POA: Diagnosis not present

## 2019-12-28 DIAGNOSIS — N2581 Secondary hyperparathyroidism of renal origin: Secondary | ICD-10-CM | POA: Diagnosis not present

## 2019-12-28 DIAGNOSIS — Z992 Dependence on renal dialysis: Secondary | ICD-10-CM | POA: Diagnosis not present

## 2019-12-28 DIAGNOSIS — N186 End stage renal disease: Secondary | ICD-10-CM | POA: Diagnosis not present

## 2019-12-30 DIAGNOSIS — S81802A Unspecified open wound, left lower leg, initial encounter: Secondary | ICD-10-CM | POA: Diagnosis not present

## 2019-12-30 DIAGNOSIS — I10 Essential (primary) hypertension: Secondary | ICD-10-CM | POA: Diagnosis not present

## 2019-12-30 DIAGNOSIS — T8133XA Disruption of traumatic injury wound repair, initial encounter: Secondary | ICD-10-CM | POA: Diagnosis not present

## 2019-12-30 DIAGNOSIS — I2510 Atherosclerotic heart disease of native coronary artery without angina pectoris: Secondary | ICD-10-CM | POA: Diagnosis not present

## 2019-12-30 DIAGNOSIS — Z4801 Encounter for change or removal of surgical wound dressing: Secondary | ICD-10-CM | POA: Diagnosis not present

## 2019-12-31 DIAGNOSIS — Z992 Dependence on renal dialysis: Secondary | ICD-10-CM | POA: Diagnosis not present

## 2019-12-31 DIAGNOSIS — N2581 Secondary hyperparathyroidism of renal origin: Secondary | ICD-10-CM | POA: Diagnosis not present

## 2019-12-31 DIAGNOSIS — N186 End stage renal disease: Secondary | ICD-10-CM | POA: Diagnosis not present

## 2020-01-02 DIAGNOSIS — N2581 Secondary hyperparathyroidism of renal origin: Secondary | ICD-10-CM | POA: Diagnosis not present

## 2020-01-02 DIAGNOSIS — Z992 Dependence on renal dialysis: Secondary | ICD-10-CM | POA: Diagnosis not present

## 2020-01-02 DIAGNOSIS — N186 End stage renal disease: Secondary | ICD-10-CM | POA: Diagnosis not present

## 2020-01-04 DIAGNOSIS — Z992 Dependence on renal dialysis: Secondary | ICD-10-CM | POA: Diagnosis not present

## 2020-01-04 DIAGNOSIS — N2581 Secondary hyperparathyroidism of renal origin: Secondary | ICD-10-CM | POA: Diagnosis not present

## 2020-01-04 DIAGNOSIS — N186 End stage renal disease: Secondary | ICD-10-CM | POA: Diagnosis not present

## 2020-01-07 DIAGNOSIS — N2581 Secondary hyperparathyroidism of renal origin: Secondary | ICD-10-CM | POA: Diagnosis not present

## 2020-01-07 DIAGNOSIS — N186 End stage renal disease: Secondary | ICD-10-CM | POA: Diagnosis not present

## 2020-01-07 DIAGNOSIS — Z992 Dependence on renal dialysis: Secondary | ICD-10-CM | POA: Diagnosis not present

## 2020-01-09 DIAGNOSIS — Z992 Dependence on renal dialysis: Secondary | ICD-10-CM | POA: Diagnosis not present

## 2020-01-09 DIAGNOSIS — N2581 Secondary hyperparathyroidism of renal origin: Secondary | ICD-10-CM | POA: Diagnosis not present

## 2020-01-09 DIAGNOSIS — N186 End stage renal disease: Secondary | ICD-10-CM | POA: Diagnosis not present

## 2020-01-11 DIAGNOSIS — N2581 Secondary hyperparathyroidism of renal origin: Secondary | ICD-10-CM | POA: Diagnosis not present

## 2020-01-11 DIAGNOSIS — N186 End stage renal disease: Secondary | ICD-10-CM | POA: Diagnosis not present

## 2020-01-11 DIAGNOSIS — Z992 Dependence on renal dialysis: Secondary | ICD-10-CM | POA: Diagnosis not present

## 2020-01-13 ENCOUNTER — Other Ambulatory Visit (INDEPENDENT_AMBULATORY_CARE_PROVIDER_SITE_OTHER): Payer: Self-pay | Admitting: Internal Medicine

## 2020-01-13 ENCOUNTER — Other Ambulatory Visit: Payer: Self-pay

## 2020-01-13 ENCOUNTER — Ambulatory Visit (HOSPITAL_COMMUNITY)
Admission: RE | Admit: 2020-01-13 | Discharge: 2020-01-13 | Disposition: A | Discharge status: Home / Self Care | Payer: Medicare HMO | Source: Ambulatory Visit | Attending: Vascular Surgery | Admitting: Vascular Surgery

## 2020-01-13 DIAGNOSIS — N186 End stage renal disease: Secondary | ICD-10-CM | POA: Insufficient documentation

## 2020-01-13 DIAGNOSIS — I770 Arteriovenous fistula, acquired: Secondary | ICD-10-CM | POA: Diagnosis not present

## 2020-01-13 DIAGNOSIS — Z992 Dependence on renal dialysis: Secondary | ICD-10-CM | POA: Diagnosis not present

## 2020-01-13 MED ORDER — HYDRALAZINE HCL 10 MG PO TABS: ORAL_TABLET | ORAL | 1 refills | Status: DC

## 2020-01-13 MED ORDER — FUROSEMIDE 40 MG PO TABS: 80.0000 mg | ORAL_TABLET | Freq: Two times a day (BID) | ORAL | 1 refills | Status: DC

## 2020-01-14 ENCOUNTER — Other Ambulatory Visit (INDEPENDENT_AMBULATORY_CARE_PROVIDER_SITE_OTHER): Payer: Self-pay | Admitting: Internal Medicine

## 2020-01-14 DIAGNOSIS — N186 End stage renal disease: Secondary | ICD-10-CM | POA: Diagnosis not present

## 2020-01-14 DIAGNOSIS — N2581 Secondary hyperparathyroidism of renal origin: Secondary | ICD-10-CM | POA: Diagnosis not present

## 2020-01-14 DIAGNOSIS — Z992 Dependence on renal dialysis: Secondary | ICD-10-CM | POA: Diagnosis not present

## 2020-01-14 MED ORDER — ISOSORBIDE MONONITRATE ER 30 MG PO TB24: 30.0000 mg | ORAL_TABLET | Freq: Every day | ORAL | 1 refills | Status: DC

## 2020-01-14 MED ORDER — METOPROLOL SUCCINATE ER 50 MG PO TB24: 50.0000 mg | ORAL_TABLET | Freq: Every day | ORAL | 1 refills | Status: DC

## 2020-01-15 ENCOUNTER — Other Ambulatory Visit (INDEPENDENT_AMBULATORY_CARE_PROVIDER_SITE_OTHER): Payer: Self-pay

## 2020-01-16 ENCOUNTER — Other Ambulatory Visit (INDEPENDENT_AMBULATORY_CARE_PROVIDER_SITE_OTHER): Payer: Self-pay | Admitting: Internal Medicine

## 2020-01-16 DIAGNOSIS — N2581 Secondary hyperparathyroidism of renal origin: Secondary | ICD-10-CM | POA: Diagnosis not present

## 2020-01-16 DIAGNOSIS — Z992 Dependence on renal dialysis: Secondary | ICD-10-CM | POA: Diagnosis not present

## 2020-01-16 DIAGNOSIS — N186 End stage renal disease: Secondary | ICD-10-CM | POA: Diagnosis not present

## 2020-01-16 MED ORDER — FUROSEMIDE 40 MG PO TABS: 80.0000 mg | ORAL_TABLET | Freq: Two times a day (BID) | ORAL | 1 refills | Status: DC

## 2020-01-16 MED ORDER — FUROSEMIDE 40 MG PO TABS: 80.0000 mg | ORAL_TABLET | Freq: Two times a day (BID) | ORAL | 1 refills | Status: AC

## 2020-01-16 MED ORDER — HYDRALAZINE HCL 10 MG PO TABS
ORAL_TABLET | ORAL | 1 refills | Status: DC
Start: 2020-01-16 — End: 2020-03-26

## 2020-01-16 MED ORDER — HYDRALAZINE HCL 10 MG PO TABS: ORAL_TABLET | ORAL | 1 refills | Status: DC

## 2020-01-16 NOTE — Telephone Encounter (Signed)
Okay, I have sent these prescriptions to [facility] on [address].

## 2020-01-16 NOTE — Telephone Encounter (Signed)
Yes refill can be local.

## 2020-01-16 NOTE — Telephone Encounter (Signed)
Called to asked to see if we need to sed as mail order or local pharmacy. Left voicemail, to return call with details.

## 2020-01-18 DIAGNOSIS — N186 End stage renal disease: Secondary | ICD-10-CM | POA: Diagnosis not present

## 2020-01-18 DIAGNOSIS — Z992 Dependence on renal dialysis: Secondary | ICD-10-CM | POA: Diagnosis not present

## 2020-01-18 DIAGNOSIS — N2581 Secondary hyperparathyroidism of renal origin: Secondary | ICD-10-CM | POA: Diagnosis not present

## 2020-01-19 DIAGNOSIS — G4733 Obstructive sleep apnea (adult) (pediatric): Secondary | ICD-10-CM | POA: Diagnosis not present

## 2020-01-20 ENCOUNTER — Ambulatory Visit: Payer: Self-pay | Admitting: Vascular Surgery

## 2020-01-20 ENCOUNTER — Other Ambulatory Visit: Payer: Self-pay

## 2020-01-20 ENCOUNTER — Encounter: Payer: Self-pay | Admitting: Vascular Surgery

## 2020-01-20 ENCOUNTER — Other Ambulatory Visit (HOSPITAL_COMMUNITY): Payer: Medicare HMO

## 2020-01-20 VITALS — BP 101/68 | HR 80 | Temp 98.2°F | Ht 72.0 in | Wt 164.8 lb

## 2020-01-20 DIAGNOSIS — N186 End stage renal disease: Secondary | ICD-10-CM

## 2020-01-20 DIAGNOSIS — Z992 Dependence on renal dialysis: Secondary | ICD-10-CM

## 2020-01-20 NOTE — Progress Notes (Signed)
Patient name: Lawrence Bonilla MRN: [id number] DOB: [birth year] Sex: male  REASON FOR VISIT: Follow-up left radiocephalic AV fistula  HPI: Lawrence Bonilla is a 69 y.o. male here today for follow-up.  Had creation of radiocephalic AV fistula on 01/08/2992 by myself.  He is on hemodialysis via right IJ tunneled catheter.  He was seen in our office for follow-up on 12/10/2019 with moderate maturation of his vein and is seen again today.  He is having successful dialysis via his catheter.  He does have some confusion regarding whether he will need to continue with dialysis after the fistula is completed and is frustrated having dialysis 3 times per week.  I explained that his access route has no bearing on his need for hemodialysis and that in all likelihood will continue lifelong dialysis 3 times per week.  I did explain the risk regarding long-term use of catheter.  Current Outpatient Medications  Medication Sig Dispense Refill   thyroid (ARMOUR THYROID) 60 MG tablet Take 60 mg by mouth daily before breakfast.     apixaban (ELIQUIS) 5 MG TABS tablet Take 1 tablet (5 mg total) by mouth 2 (two) times daily. 60 tablet 3   atorvastatin (LIPITOR) 40 MG tablet Take 1 tablet (40 mg total) by mouth daily. 90 tablet 0   AURYXIA 1 GM 210 MG(Fe) tablet Take 210 mg by mouth 3 (three) times daily. (Patient not taking: Reported on 01/20/2020)     calcitRIOL (ROCALTROL) 0.25 MCG capsule Take 1 capsule (0.25 mcg total) by mouth daily. 30 capsule 3   Calcium Carb-Cholecalciferol (CALCIUM 600 + D PO) Take 1 tablet by mouth daily.      furosemide (LASIX) 40 MG tablet Take 2 tablets (80 mg total) by mouth 2 (two) times daily. 360 tablet 1   heparin 1000 unit/mL SOLN injection Heparin Sodium (Porcine) 1,000 Units/mL Catheter Lock Arterial     hydrALAZINE (APRESOLINE) 10 MG tablet TAKE 1 TABLET(10 MG) BY MOUTH TWICE DAILY 180 tablet 1   HYDROcodone-acetaminophen (NORCO) 5-325 MG tablet  Take 1 tablet by mouth every 6 (six) hours as needed for moderate pain. 10 tablet 0   isosorbide mononitrate (IMDUR) 30 MG 24 hr tablet Take 1 tablet (30 mg total) by mouth daily. 90 tablet 1   Methoxy PEG-Epoetin Beta (MIRCERA IJ) Mircera     metoprolol succinate (TOPROL-XL) 50 MG 24 hr tablet Take 1 tablet (50 mg total) by mouth daily. Take with or immediately following a meal. 90 tablet 1   VITAMIN D PO Take by mouth.     No current facility-administered medications for this visit.     PHYSICAL EXAM: Vitals:   01/20/20 1111  BP: 101/68  Pulse: 80  Temp: 98.2 F (36.8 C)  TempSrc: Skin  SpO2: 96%  Weight: 164 lb 12.8 oz (74.8 kg)  Height: 6' (1.829 m)    GENERAL: The patient is a well-nourished male, in no acute distress. The vital signs are documented above. He does have a very easily visualized cephalic vein fistula.  He is thin and the cephalic vein is very close to the surface of the skin.  The size appears to be approximately 5 mm in diameter  He did have a recent duplex at [facility] showing 3-1/2 to 4/2 mm diameter throughout the course  MEDICAL ISSUES: I discussed options with the patient.  He is to the point where this looks like his fistula does have potential for working.  He is not quite 3  months out.  He does have 2 very large competing branches and I image these with SonoSite as well.  I feel that he would have a much higher likelihood of success if these branches were ligated.  I have recommended ligation of these competing branches as an outpatient and then approximately 1 more month of catheter use prior to attempting use of his fistula.  We will coordinate that with the patient   Rosetta Posner, MD [facility] of [facility] Tel [phone] Pager [phone]

## 2020-01-20 NOTE — H&P (View-Only) (Signed)
Patient name: Lawrence Bonilla MRN: [id number] DOB: 1950/10/05 Sex: male  REASON FOR VISIT: Follow-up left radiocephalic AV fistula  HPI: Lawrence Bonilla is a 69 y.o. male here today for follow-up.  Had creation of radiocephalic AV fistula on 08/22/270 by myself.  He is on hemodialysis via right IJ tunneled catheter.  He was seen in our office for follow-up on 12/10/2019 with moderate maturation of his vein and is seen again today.  He is having successful dialysis via his catheter.  He does have some confusion regarding whether he will need to continue with dialysis after the fistula is completed and is frustrated having dialysis 3 times per week.  I explained that his access route has no bearing on his need for hemodialysis and that in all likelihood will continue lifelong dialysis 3 times per week.  I did explain the risk regarding long-term use of catheter.  Current Outpatient Medications  Medication Sig Dispense Refill  . thyroid (ARMOUR THYROID) 60 MG tablet Take 60 mg by mouth daily before breakfast.    . apixaban (ELIQUIS) 5 MG TABS tablet Take 1 tablet (5 mg total) by mouth 2 (two) times daily. 60 tablet 3  . atorvastatin (LIPITOR) 40 MG tablet Take 1 tablet (40 mg total) by mouth daily. 90 tablet 0  . AURYXIA 1 GM 210 MG(Fe) tablet Take 210 mg by mouth 3 (three) times daily. (Patient not taking: Reported on 01/20/2020)    . calcitRIOL (ROCALTROL) 0.25 MCG capsule Take 1 capsule (0.25 mcg total) by mouth daily. 30 capsule 3  . Calcium Carb-Cholecalciferol (CALCIUM 600 + D PO) Take 1 tablet by mouth daily.     . furosemide (LASIX) 40 MG tablet Take 2 tablets (80 mg total) by mouth 2 (two) times daily. 360 tablet 1  . heparin 1000 unit/mL SOLN injection Heparin Sodium (Porcine) 1,000 Units/mL Catheter Lock Arterial    . hydrALAZINE (APRESOLINE) 10 MG tablet TAKE 1 TABLET(10 MG) BY MOUTH TWICE DAILY 180 tablet 1  . HYDROcodone-acetaminophen (NORCO) 5-325 MG tablet  Take 1 tablet by mouth every 6 (six) hours as needed for moderate pain. 10 tablet 0  . isosorbide mononitrate (IMDUR) 30 MG 24 hr tablet Take 1 tablet (30 mg total) by mouth daily. 90 tablet 1  . Methoxy PEG-Epoetin Beta (MIRCERA IJ) Mircera    . metoprolol succinate (TOPROL-XL) 50 MG 24 hr tablet Take 1 tablet (50 mg total) by mouth daily. Take with or immediately following a meal. 90 tablet 1  . VITAMIN D PO Take by mouth.     No current facility-administered medications for this visit.     PHYSICAL EXAM: Vitals:   01/20/20 1111  BP: 101/68  Pulse: 80  Temp: 98.2 F (36.8 C)  TempSrc: Skin  SpO2: 96%  Weight: 164 lb 12.8 oz (74.8 kg)  Height: 6' (1.829 m)    GENERAL: The patient is a well-nourished male, in no acute distress. The vital signs are documented above. He does have a very easily visualized cephalic vein fistula.  He is thin and the cephalic vein is very close to the surface of the skin.  The size appears to be approximately 5 mm in diameter  He did have a recent duplex at [facility] showing 3-1/2 to 4/2 mm diameter throughout the course  MEDICAL ISSUES: I discussed options with the patient.  He is to the point where this looks like his fistula does have potential for working.  He is not quite 3  months out.  He does have 2 very large competing branches and I image these with SonoSite as well.  I feel that he would have a much higher likelihood of success if these branches were ligated.  I have recommended ligation of these competing branches as an outpatient and then approximately 1 more month of catheter use prior to attempting use of his fistula.  We will coordinate that with the patient   Rosetta Posner, MD [facility] of [facility] Tel [phone] Pager [phone]

## 2020-01-21 DIAGNOSIS — N2581 Secondary hyperparathyroidism of renal origin: Secondary | ICD-10-CM | POA: Diagnosis not present

## 2020-01-21 DIAGNOSIS — I129 Hypertensive chronic kidney disease with stage 1 through stage 4 chronic kidney disease, or unspecified chronic kidney disease: Secondary | ICD-10-CM | POA: Diagnosis not present

## 2020-01-21 DIAGNOSIS — N186 End stage renal disease: Secondary | ICD-10-CM | POA: Diagnosis not present

## 2020-01-21 DIAGNOSIS — Z992 Dependence on renal dialysis: Secondary | ICD-10-CM | POA: Diagnosis not present

## 2020-01-23 DIAGNOSIS — N2581 Secondary hyperparathyroidism of renal origin: Secondary | ICD-10-CM | POA: Diagnosis not present

## 2020-01-23 DIAGNOSIS — Z992 Dependence on renal dialysis: Secondary | ICD-10-CM | POA: Diagnosis not present

## 2020-01-23 DIAGNOSIS — N186 End stage renal disease: Secondary | ICD-10-CM | POA: Diagnosis not present

## 2020-01-25 DIAGNOSIS — N186 End stage renal disease: Secondary | ICD-10-CM | POA: Diagnosis not present

## 2020-01-25 DIAGNOSIS — Z992 Dependence on renal dialysis: Secondary | ICD-10-CM | POA: Diagnosis not present

## 2020-01-25 DIAGNOSIS — N2581 Secondary hyperparathyroidism of renal origin: Secondary | ICD-10-CM | POA: Diagnosis not present

## 2020-01-28 DIAGNOSIS — Z992 Dependence on renal dialysis: Secondary | ICD-10-CM | POA: Diagnosis not present

## 2020-01-28 DIAGNOSIS — N186 End stage renal disease: Secondary | ICD-10-CM | POA: Diagnosis not present

## 2020-01-28 DIAGNOSIS — N2581 Secondary hyperparathyroidism of renal origin: Secondary | ICD-10-CM | POA: Diagnosis not present

## 2020-01-30 DIAGNOSIS — Z992 Dependence on renal dialysis: Secondary | ICD-10-CM | POA: Diagnosis not present

## 2020-01-30 DIAGNOSIS — N2581 Secondary hyperparathyroidism of renal origin: Secondary | ICD-10-CM | POA: Diagnosis not present

## 2020-01-30 DIAGNOSIS — N186 End stage renal disease: Secondary | ICD-10-CM | POA: Diagnosis not present

## 2020-01-31 DIAGNOSIS — E11628 Type 2 diabetes mellitus with other skin complications: Secondary | ICD-10-CM | POA: Diagnosis not present

## 2020-01-31 DIAGNOSIS — I10 Essential (primary) hypertension: Secondary | ICD-10-CM | POA: Diagnosis not present

## 2020-01-31 DIAGNOSIS — I2510 Atherosclerotic heart disease of native coronary artery without angina pectoris: Secondary | ICD-10-CM | POA: Diagnosis not present

## 2020-01-31 DIAGNOSIS — I872 Venous insufficiency (chronic) (peripheral): Secondary | ICD-10-CM | POA: Diagnosis not present

## 2020-01-31 DIAGNOSIS — S81802A Unspecified open wound, left lower leg, initial encounter: Secondary | ICD-10-CM | POA: Diagnosis not present

## 2020-01-31 DIAGNOSIS — E1159 Type 2 diabetes mellitus with other circulatory complications: Secondary | ICD-10-CM | POA: Diagnosis not present

## 2020-02-01 DIAGNOSIS — N2581 Secondary hyperparathyroidism of renal origin: Secondary | ICD-10-CM | POA: Diagnosis not present

## 2020-02-01 DIAGNOSIS — N186 End stage renal disease: Secondary | ICD-10-CM | POA: Diagnosis not present

## 2020-02-01 DIAGNOSIS — Z992 Dependence on renal dialysis: Secondary | ICD-10-CM | POA: Diagnosis not present

## 2020-02-03 ENCOUNTER — Other Ambulatory Visit: Payer: Self-pay

## 2020-02-03 ENCOUNTER — Other Ambulatory Visit (INDEPENDENT_AMBULATORY_CARE_PROVIDER_SITE_OTHER): Payer: Self-pay | Admitting: Internal Medicine

## 2020-02-03 ENCOUNTER — Telehealth: Payer: Self-pay

## 2020-02-03 ENCOUNTER — Telehealth (INDEPENDENT_AMBULATORY_CARE_PROVIDER_SITE_OTHER): Payer: Self-pay | Admitting: Internal Medicine

## 2020-02-03 ENCOUNTER — Ambulatory Visit: Payer: Medicare HMO | Admitting: Vascular Surgery

## 2020-02-03 MED ORDER — METOPROLOL SUCCINATE ER 50 MG PO TB24
50.0000 mg | ORAL_TABLET | Freq: Every day | ORAL | 1 refills | Status: DC
Start: 2020-02-03 — End: 2020-03-02

## 2020-02-03 NOTE — Telephone Encounter (Signed)
Attempted to reach pt x 4 attempts to schedule pt for ligation of competing branches of left AVF with Dr. Donnetta Hutching as planned for 02/06/20. No answer. Left multiple messages to return call. UTR letter was mailed to pt on 01/31/20.

## 2020-02-04 DIAGNOSIS — Z992 Dependence on renal dialysis: Secondary | ICD-10-CM | POA: Diagnosis not present

## 2020-02-04 DIAGNOSIS — N186 End stage renal disease: Secondary | ICD-10-CM | POA: Diagnosis not present

## 2020-02-04 DIAGNOSIS — N2581 Secondary hyperparathyroidism of renal origin: Secondary | ICD-10-CM | POA: Diagnosis not present

## 2020-02-04 NOTE — Telephone Encounter (Signed)
Done

## 2020-02-05 ENCOUNTER — Encounter (HOSPITAL_COMMUNITY)
Admission: RE | Admit: 2020-02-05 | Discharge: 2020-02-05 | Disposition: A | Discharge status: Home / Self Care | Payer: Medicare HMO | Source: Ambulatory Visit | Attending: Vascular Surgery | Admitting: Vascular Surgery

## 2020-02-05 ENCOUNTER — Other Ambulatory Visit (HOSPITAL_COMMUNITY)
Admission: RE | Admit: 2020-02-05 | Discharge: 2020-02-05 | Disposition: A | Discharge status: Home / Self Care | Payer: Medicare HMO | Source: Ambulatory Visit | Attending: Vascular Surgery | Admitting: Vascular Surgery

## 2020-02-05 ENCOUNTER — Encounter (HOSPITAL_COMMUNITY): Payer: Self-pay

## 2020-02-05 ENCOUNTER — Other Ambulatory Visit: Payer: Self-pay

## 2020-02-05 DIAGNOSIS — Z01812 Encounter for preprocedural laboratory examination: Secondary | ICD-10-CM | POA: Insufficient documentation

## 2020-02-05 DIAGNOSIS — Z992 Dependence on renal dialysis: Secondary | ICD-10-CM | POA: Diagnosis not present

## 2020-02-05 DIAGNOSIS — Z20822 Contact with and (suspected) exposure to covid-19: Secondary | ICD-10-CM | POA: Insufficient documentation

## 2020-02-05 DIAGNOSIS — N186 End stage renal disease: Secondary | ICD-10-CM | POA: Diagnosis not present

## 2020-02-05 DIAGNOSIS — N2581 Secondary hyperparathyroidism of renal origin: Secondary | ICD-10-CM | POA: Diagnosis not present

## 2020-02-05 LAB — SARS CORONAVIRUS 2 (TAT 6-24 HRS): SARS Coronavirus 2: NEGATIVE

## 2020-02-05 NOTE — Patient Instructions (Signed)
Your procedure is scheduled on: 02/06/2020  Report to [facility] at 8:00    AM.  Call this number if you have problems the morning of surgery: [phone]   Remember:   Do not Eat or Drink after midnight         No Smoking the morning of surgery  :  Take these medicines the morning of surgery with A SIP OF WATER: Isosorbide, metoprolol, and thyroid 60   Do not wear jewelry, make-up or nail polish.  Do not wear lotions, powders, or perfumes. You may wear deodorant.  Do not shave 48 hours prior to surgery. Men may shave face and neck.  Do not bring valuables to the hospital.  Contacts, dentures or bridgework may not be worn into surgery.  Leave suitcase in the car. After surgery it may be brought to your room.  For patients admitted to the hospital, checkout time is 11:00 AM the day of discharge.   Patients discharged the day of surgery will not be allowed to drive home.    Special Instructions: Shower using CHG night before surgery and shower the day of surgery use CHG.  Use special wash - you have one bottle of CHG for all showers.  You should use approximately 1/2 of the bottle for each shower.  How to Use Chlorhexidine for Bathing Chlorhexidine gluconate (CHG) is a germ-killing (antiseptic) solution that is used to clean the skin. It can get rid of the bacteria that normally live on the skin and can keep them away for about 24 hours. To clean your skin with CHG, you may be given:  A CHG solution to use in the shower or as part of a sponge bath.  A prepackaged cloth that contains CHG. Cleaning your skin with CHG may help lower the risk for infection:  While you are staying in the intensive care unit of the hospital.  If you have a vascular access, such as a central line, to provide short-term or long-term access to your veins.  If you have a catheter to drain urine from your bladder.  If you are on a ventilator. A ventilator is a machine that helps you breathe by moving air  in and out of your lungs.  After surgery. What are the risks? Risks of using CHG include:  A skin reaction.  Hearing loss, if CHG gets in your ears.  Eye injury, if CHG gets in your eyes and is not rinsed out.  The CHG product catching fire. Make sure that you avoid smoking and flames after applying CHG to your skin. Do not use CHG:  If you have a chlorhexidine allergy or have previously reacted to chlorhexidine.  On babies younger than 9 months of age. How to use CHG solution  Use CHG only as told by your health care provider, and follow the instructions on the label.  Use the full amount of CHG as directed. Usually, this is one bottle. During a shower Follow these steps when using CHG solution during a shower (unless your health care provider gives you different instructions): 1. Start the shower. 2. Use your normal soap and shampoo to wash your face and hair. 3. Turn off the shower or move out of the shower stream. 4. Pour the CHG onto a clean washcloth. Do not use any type of brush or rough-edged sponge. 5. Starting at your neck, lather your body down to your toes. Make sure you follow these instructions: ? If you will be having  surgery, pay special attention to the part of your body where you will be having surgery. Scrub this area for at least 1 minute. ? Do not use CHG on your head or face. If the solution gets into your ears or eyes, rinse them well with water. ? Avoid your genital area. ? Avoid any areas of skin that have broken skin, cuts, or scrapes. ? Scrub your back and under your arms. Make sure to wash skin folds. 6. Let the lather sit on your skin for 1-2 minutes or as long as told by your health care provider. 7. Thoroughly rinse your entire body in the shower. Make sure that all body creases and crevices are rinsed well. 8. Dry off with a clean towel. Do not put any substances on your body afterward--such as powder, lotion, or perfume--unless you are told to do  so by your health care provider. Only use lotions that are recommended by the manufacturer. 9. Put on clean clothes or pajamas. 10. If it is the night before your surgery, sleep in clean sheets.  During a sponge bath Follow these steps when using CHG solution during a sponge bath (unless your health care provider gives you different instructions): 1. Use your normal soap and shampoo to wash your face and hair. 2. Pour the CHG onto a clean washcloth. 3. Starting at your neck, lather your body down to your toes. Make sure you follow these instructions: ? If you will be having surgery, pay special attention to the part of your body where you will be having surgery. Scrub this area for at least 1 minute. ? Do not use CHG on your head or face. If the solution gets into your ears or eyes, rinse them well with water. ? Avoid your genital area. ? Avoid any areas of skin that have broken skin, cuts, or scrapes. ? Scrub your back and under your arms. Make sure to wash skin folds. 4. Let the lather sit on your skin for 1-2 minutes or as long as told by your health care provider. 5. Using a different clean, wet washcloth, thoroughly rinse your entire body. Make sure that all body creases and crevices are rinsed well. 6. Dry off with a clean towel. Do not put any substances on your body afterward--such as powder, lotion, or perfume--unless you are told to do so by your health care provider. Only use lotions that are recommended by the manufacturer. 7. Put on clean clothes or pajamas. 8. If it is the night before your surgery, sleep in clean sheets. How to use CHG prepackaged cloths  Only use CHG cloths as told by your health care provider, and follow the instructions on the label.  Use the CHG cloth on clean, dry skin.  Do not use the CHG cloth on your head or face unless your health care provider tells you to.  When washing with the CHG cloth: ? Avoid your genital area. ? Avoid any areas of skin  that have broken skin, cuts, or scrapes. Before surgery Follow these steps when using a CHG cloth to clean before surgery (unless your health care provider gives you different instructions): 1. Using the CHG cloth, vigorously scrub the part of your body where you will be having surgery. Scrub using a back-and-forth motion for 3 minutes. The area on your body should be completely wet with CHG when you are done scrubbing. 2. Do not rinse. Discard the cloth and let the area air-dry. Do not put any substances  on the area afterward, such as powder, lotion, or perfume. 3. Put on clean clothes or pajamas. 4. If it is the night before your surgery, sleep in clean sheets.  For general bathing Follow these steps when using CHG cloths for general bathing (unless your health care provider gives you different instructions). 1. Use a separate CHG cloth for each area of your body. Make sure you wash between any folds of skin and between your fingers and toes. Wash your body in the following order, switching to a new cloth after each step: ? The front of your neck, shoulders, and chest. ? Both of your arms, under your arms, and your hands. ? Your stomach and groin area, avoiding the genitals. ? Your right leg and foot. ? Your left leg and foot. ? The back of your neck, your back, and your buttocks. 2. Do not rinse. Discard the cloth and let the area air-dry. Do not put any substances on your body afterward--such as powder, lotion, or perfume--unless you are told to do so by your health care provider. Only use lotions that are recommended by the manufacturer. 3. Put on clean clothes or pajamas. Contact a health care provider if:  Your skin gets irritated after scrubbing.  You have questions about using your solution or cloth. Get help right away if:  Your eyes become very red or swollen.  Your eyes itch badly.  Your skin itches badly and is red or swollen.  Your hearing changes.  You have trouble  seeing.  You have swelling or tingling in your mouth or throat.  You have trouble breathing.  You swallow any chlorhexidine. Summary  Chlorhexidine gluconate (CHG) is a germ-killing (antiseptic) solution that is used to clean the skin. Cleaning your skin with CHG may help to lower your risk for infection.  You may be given CHG to use for bathing. It may be in a bottle or in a prepackaged cloth to use on your skin. Carefully follow your health care provider's instructions and the instructions on the product label.  Do not use CHG if you have a chlorhexidine allergy.  Contact your health care provider if your skin gets irritated after scrubbing. This information is not intended to replace advice given to you by your health care provider. Make sure you discuss any questions you have with your health care provider. Document Revised: 07/26/2018 Document Reviewed: 04/06/2017 Elsevier Patient Education  2020 [place] After These instructions provide you with information about caring for yourself after your procedure. Your health care provider may also give you more specific instructions. Your treatment has been planned according to current medical practices, but problems sometimes occur. Call your health care provider if you have any problems or questions after your procedure. What can I expect after the procedure? After your procedure, you may:  Feel sleepy for several hours.  Feel clumsy and have poor balance for several hours.  Feel forgetful about what happened after the procedure.  Have poor judgment for several hours.  Feel nauseous or vomit.  Have a sore throat if you had a breathing tube during the procedure. Follow these instructions at home: For at least 24 hours after the procedure:      Have a responsible adult stay with you. It is important to have someone help care for you until you are awake and alert.  Rest as needed.  Do  not: ? Participate in activities in which you could fall or become  injured. ? Drive. ? Use heavy machinery. ? Drink alcohol. ? Take sleeping pills or medicines that cause drowsiness. ? Make important decisions or sign legal documents. ? Take care of children on your own. Eating and drinking  Follow the diet that is recommended by your health care provider.  If you vomit, drink water, juice, or soup when you can drink without vomiting.  Make sure you have little or no nausea before eating solid foods. General instructions  Take over-the-counter and prescription medicines only as told by your health care provider.  If you have sleep apnea, surgery and certain medicines can increase your risk for breathing problems. Follow instructions from your health care provider about wearing your sleep device: ? Anytime you are sleeping, including during daytime naps. ? While taking prescription pain medicines, sleeping medicines, or medicines that make you drowsy.  If you smoke, do not smoke without supervision.  Keep all follow-up visits as told by your health care provider. This is important. Contact a health care provider if:  You keep feeling nauseous or you keep vomiting.  You feel light-headed.  You develop a rash.  You have a fever. Get help right away if:  You have trouble breathing. Summary  For several hours after your procedure, you may feel sleepy and have poor judgment.  Have a responsible adult stay with you for at least 24 hours or until you are awake and alert. This information is not intended to replace advice given to you by your health care provider. Make sure you discuss any questions you have with your health care provider. Document Revised: 08/07/2017 Document Reviewed: 08/30/2015 Elsevier Patient Education  [place].

## 2020-02-05 NOTE — Anesthesia Preprocedure Evaluation (Addendum)
Anesthesia Evaluation  Patient identified by MRN, date of birth, ID band Patient awake    Reviewed: Allergy & Precautions, NPO status , Patient's Chart, lab work & pertinent test results, reviewed documented beta blocker date and time   History of Anesthesia Complications Negative for: history of anesthetic complications  Airway Mallampati: III  TM Distance: >3 FB Neck ROM: Full   Comment: Difficult airway Dental  (+) Dental Advisory Given, Chipped   Pulmonary shortness of breath, with exertion and Long-Term Oxygen Therapy, sleep apnea, Continuous Positive Airway Pressure Ventilation and Oxygen sleep apnea , PE   Pulmonary exam normal breath sounds clear to auscultation       Cardiovascular Exercise Tolerance: Poor hypertension, Pt. on medications and Pt. on home beta blockers + Peripheral Vascular Disease and +CHF  + dysrhythmias Atrial Fibrillation  Rhythm:Irregular Rate:Normal  Results reviewed.  LVEF is reduced at 25 to 88%, I am not certain about his prior baseline as we still await records.  Let us try and have him stop Zestril and replace it with Entresto after appropriate washout, start at 24/26 mg twice daily.  Reduce Lasix to 80 mg in the morning and 40 mg in the afternoon.  He will need a follow-up BMET in 7 to 10 days after this change.  Please make sure that he has an office follow-up visit within the next 4 to 6 weeks as well. 02-May-2019 09:13:20 [facility] ROUTINE RECORD Atrial fibrillation Left axis deviation Nonspecific ST and T wave abnormality Prolonged QT Abnormal ECG Confirmed by Cherlynn Kaiser [phone]) on 05/05/2019 11:43:17 AM    Neuro/Psych  Headaches, CVA, Residual Symptoms    GI/Hepatic negative GI ROS, Neg liver ROS,   Endo/Other  diabetes, Well Controlled, Type 2Hypothyroidism   Renal/GU CRFRenal disease  negative genitourinary   Musculoskeletal negative musculoskeletal  ROS (+)   Abdominal   Peds  Hematology negative hematology ROS (+)   Anesthesia Other Findings   Reproductive/Obstetrics negative OB ROS                          Anesthesia Physical Anesthesia Plan  ASA: IV  Anesthesia Plan: General   Post-op Pain Management:    Induction: Intravenous  PONV Risk Score and Plan: TIVA  Airway Management Planned: Nasal Cannula, Natural Airway and Simple Face Mask  Additional Equipment:   Intra-op Plan:   Post-operative Plan:   Informed Consent:     Dental advisory given  Plan Discussed with: CRNA and Surgeon  Anesthesia Plan Comments:        Anesthesia Quick Evaluation

## 2020-02-06 ENCOUNTER — Encounter (HOSPITAL_COMMUNITY)
Admission: RE | Disposition: A | Discharge status: Home / Self Care | Payer: Self-pay | Source: Home / Self Care | Attending: Vascular Surgery

## 2020-02-06 ENCOUNTER — Ambulatory Visit (HOSPITAL_COMMUNITY): Payer: Medicare HMO | Admitting: Anesthesiology

## 2020-02-06 ENCOUNTER — Encounter (HOSPITAL_COMMUNITY): Payer: Self-pay | Admitting: Vascular Surgery

## 2020-02-06 ENCOUNTER — Ambulatory Visit (HOSPITAL_COMMUNITY)
Admission: RE | Admit: 2020-02-06 | Discharge: 2020-02-06 | Disposition: A | Discharge status: Home / Self Care | Payer: Medicare HMO | Attending: Vascular Surgery | Admitting: Vascular Surgery

## 2020-02-06 DIAGNOSIS — E1151 Type 2 diabetes mellitus with diabetic peripheral angiopathy without gangrene: Secondary | ICD-10-CM | POA: Diagnosis not present

## 2020-02-06 DIAGNOSIS — I132 Hypertensive heart and chronic kidney disease with heart failure and with stage 5 chronic kidney disease, or end stage renal disease: Secondary | ICD-10-CM | POA: Diagnosis not present

## 2020-02-06 DIAGNOSIS — E119 Type 2 diabetes mellitus without complications: Secondary | ICD-10-CM | POA: Diagnosis not present

## 2020-02-06 DIAGNOSIS — I5023 Acute on chronic systolic (congestive) heart failure: Secondary | ICD-10-CM | POA: Diagnosis not present

## 2020-02-06 DIAGNOSIS — E1122 Type 2 diabetes mellitus with diabetic chronic kidney disease: Secondary | ICD-10-CM | POA: Insufficient documentation

## 2020-02-06 DIAGNOSIS — G4730 Sleep apnea, unspecified: Secondary | ICD-10-CM | POA: Insufficient documentation

## 2020-02-06 DIAGNOSIS — Z79899 Other long term (current) drug therapy: Secondary | ICD-10-CM | POA: Diagnosis not present

## 2020-02-06 DIAGNOSIS — G4733 Obstructive sleep apnea (adult) (pediatric): Secondary | ICD-10-CM | POA: Diagnosis not present

## 2020-02-06 DIAGNOSIS — N186 End stage renal disease: Secondary | ICD-10-CM | POA: Insufficient documentation

## 2020-02-06 DIAGNOSIS — I509 Heart failure, unspecified: Secondary | ICD-10-CM | POA: Diagnosis not present

## 2020-02-06 DIAGNOSIS — E039 Hypothyroidism, unspecified: Secondary | ICD-10-CM | POA: Diagnosis not present

## 2020-02-06 DIAGNOSIS — Z8673 Personal history of transient ischemic attack (TIA), and cerebral infarction without residual deficits: Secondary | ICD-10-CM | POA: Diagnosis not present

## 2020-02-06 DIAGNOSIS — Z992 Dependence on renal dialysis: Secondary | ICD-10-CM | POA: Diagnosis not present

## 2020-02-06 DIAGNOSIS — Z7901 Long term (current) use of anticoagulants: Secondary | ICD-10-CM | POA: Diagnosis not present

## 2020-02-06 DIAGNOSIS — T82898A Other specified complication of vascular prosthetic devices, implants and grafts, initial encounter: Secondary | ICD-10-CM | POA: Diagnosis not present

## 2020-02-06 DIAGNOSIS — Z9981 Dependence on supplemental oxygen: Secondary | ICD-10-CM | POA: Insufficient documentation

## 2020-02-06 DIAGNOSIS — I4891 Unspecified atrial fibrillation: Secondary | ICD-10-CM | POA: Diagnosis not present

## 2020-02-06 DIAGNOSIS — Z452 Encounter for adjustment and management of vascular access device: Secondary | ICD-10-CM | POA: Diagnosis not present

## 2020-02-06 DIAGNOSIS — N185 Chronic kidney disease, stage 5: Secondary | ICD-10-CM | POA: Diagnosis not present

## 2020-02-06 DIAGNOSIS — Z79890 Hormone replacement therapy (postmenopausal): Secondary | ICD-10-CM | POA: Diagnosis not present

## 2020-02-06 HISTORY — PX: LIGATION OF COMPETING BRANCHES OF ARTERIOVENOUS FISTULA: SHX5949

## 2020-02-06 LAB — POCT I-STAT, CHEM 8
BUN: 42 mg/dL — ABNORMAL HIGH (ref 8–23)
Calcium, Ion: 1.13 mmol/L — ABNORMAL LOW (ref 1.15–1.40)
Chloride: 98 mmol/L (ref 98–111)
Creatinine, Ser: 5.1 mg/dL — ABNORMAL HIGH (ref 0.61–1.24)
Glucose, Bld: 78 mg/dL (ref 70–99)
HCT: 36 % — ABNORMAL LOW (ref 39.0–52.0)
Hemoglobin: 12.2 g/dL — ABNORMAL LOW (ref 13.0–17.0)
Potassium: 4.8 mmol/L (ref 3.5–5.1)
Sodium: 139 mmol/L (ref 135–145)
TCO2: 26 mmol/L (ref 22–32)

## 2020-02-06 SURGERY — LIGATION OF COMPETING BRANCHES OF ARTERIOVENOUS FISTULA
Anesthesia: General | Laterality: Left

## 2020-02-06 MED ORDER — HYDROMORPHONE HCL 1 MG/ML IJ SOLN: 0.2500 mg | INTRAMUSCULAR | Status: DC | PRN

## 2020-02-06 MED ORDER — CHLORHEXIDINE GLUCONATE 4 % EX LIQD: 60.0000 mL | Freq: Once | CUTANEOUS | Status: DC

## 2020-02-06 MED ORDER — PROPOFOL 500 MG/50ML IV EMUL
INTRAVENOUS | Status: DC | PRN
  Administered 2020-02-06: 25 ug/kg/min via INTRAVENOUS

## 2020-02-06 MED ORDER — LIDOCAINE-EPINEPHRINE (PF) 1 %-1:200000 IJ SOLN
INTRAMUSCULAR | Status: DC | PRN
  Administered 2020-02-06: 2.5 mL

## 2020-02-06 MED ORDER — MIDAZOLAM HCL 2 MG/2ML IJ SOLN
INTRAMUSCULAR | Status: DC | PRN
  Administered 2020-02-06: .5 mg via INTRAVENOUS

## 2020-02-06 MED ORDER — MIDAZOLAM HCL 2 MG/2ML IJ SOLN
INTRAMUSCULAR | Status: AC
  Filled 2020-02-06: qty 2

## 2020-02-06 MED ORDER — SODIUM CHLORIDE 0.9 % IV SOLN: INTRAVENOUS | Status: DC

## 2020-02-06 MED ORDER — LIDOCAINE 2% (20 MG/ML) 5 ML SYRINGE
INTRAMUSCULAR | Status: DC | PRN
  Administered 2020-02-06: 50 mg via INTRAVENOUS

## 2020-02-06 MED ORDER — CHLORHEXIDINE GLUCONATE 0.12 % MT SOLN
15.0000 mL | Freq: Once | OROMUCOSAL | Status: AC
  Administered 2020-02-06: 15 mL via OROMUCOSAL

## 2020-02-06 MED ORDER — ONDANSETRON HCL 4 MG/2ML IJ SOLN: 4.0000 mg | Freq: Once | INTRAMUSCULAR | Status: DC | PRN

## 2020-02-06 MED ORDER — ONDANSETRON HCL 4 MG/2ML IJ SOLN
INTRAMUSCULAR | Status: DC | PRN
  Administered 2020-02-06: 4 mg via INTRAVENOUS

## 2020-02-06 MED ORDER — PROPOFOL 10 MG/ML IV BOLUS
INTRAVENOUS | Status: AC
  Filled 2020-02-06: qty 20

## 2020-02-06 MED ORDER — CEFAZOLIN SODIUM-DEXTROSE 2-4 GM/100ML-% IV SOLN
2.0000 g | INTRAVENOUS | Status: AC
  Administered 2020-02-06: 2 g via INTRAVENOUS
  Filled 2020-02-06: qty 100

## 2020-02-06 MED ORDER — ORAL CARE MOUTH RINSE: 15.0000 mL | Freq: Once | OROMUCOSAL | Status: AC

## 2020-02-06 MED ORDER — FENTANYL CITRATE (PF) 250 MCG/5ML IJ SOLN
INTRAMUSCULAR | Status: AC
  Filled 2020-02-06: qty 5

## 2020-02-06 MED ORDER — PHENYLEPHRINE 40 MCG/ML (10ML) SYRINGE FOR IV PUSH (FOR BLOOD PRESSURE SUPPORT)
PREFILLED_SYRINGE | INTRAVENOUS | Status: AC
  Filled 2020-02-06: qty 10

## 2020-02-06 MED ORDER — 0.9 % SODIUM CHLORIDE (POUR BTL) OPTIME
TOPICAL | Status: DC | PRN
  Administered 2020-02-06: 1000 mL

## 2020-02-06 MED ORDER — LIDOCAINE 2% (20 MG/ML) 5 ML SYRINGE
INTRAMUSCULAR | Status: AC
  Filled 2020-02-06: qty 5

## 2020-02-06 MED ORDER — SODIUM CHLORIDE 0.9 % IV SOLN: INTRAVENOUS | Status: DC | PRN

## 2020-02-06 MED ORDER — ONDANSETRON HCL 4 MG/2ML IJ SOLN
INTRAMUSCULAR | Status: AC
  Filled 2020-02-06: qty 2

## 2020-02-06 MED ORDER — FENTANYL CITRATE (PF) 100 MCG/2ML IJ SOLN
INTRAMUSCULAR | Status: DC | PRN
Start: 2020-02-06 — End: 2020-02-06
  Administered 2020-02-06: 25 ug via INTRAVENOUS

## 2020-02-06 SURGICAL SUPPLY — 26 items
ADH SKN CLS APL DERMABOND .7 (GAUZE/BANDAGES/DRESSINGS) ×1
ARMBAND PINK RESTRICT EXTREMIT (MISCELLANEOUS) ×3 IMPLANT
CLIP LIGATING EXTRA MED SLVR (CLIP) ×3 IMPLANT
CLIP LIGATING EXTRA SM BLUE (MISCELLANEOUS) ×3 IMPLANT
COVER LIGHT HANDLE STERIS (MISCELLANEOUS) ×6 IMPLANT
COVER WAND RF STERILE (DRAPES) ×3 IMPLANT
DECANTER SPIKE VIAL GLASS SM (MISCELLANEOUS) ×3 IMPLANT
DERMABOND ADVANCED (GAUZE/BANDAGES/DRESSINGS) ×2
DERMABOND ADVANCED .7 DNX12 (GAUZE/BANDAGES/DRESSINGS) ×1 IMPLANT
ELECT REM PT RETURN 9FT ADLT (ELECTROSURGICAL) ×3
ELECTRODE REM PT RTRN 9FT ADLT (ELECTROSURGICAL) ×1 IMPLANT
GLOVE BIOGEL PI IND STRL 7.0 (GLOVE) ×3 IMPLANT
GLOVE BIOGEL PI INDICATOR 7.0 (GLOVE) ×6
GLOVE SS BIOGEL STRL SZ 7.5 (GLOVE) ×1 IMPLANT
GLOVE SUPERSENSE BIOGEL SZ 7.5 (GLOVE) ×2
GOWN STRL REUS W/ TWL LRG LVL3 (GOWN DISPOSABLE) ×3 IMPLANT
GOWN STRL REUS W/TWL LRG LVL3 (GOWN DISPOSABLE) ×9
KIT TURNOVER KIT A (KITS) ×3 IMPLANT
MANIFOLD NEPTUNE II (INSTRUMENTS) ×3 IMPLANT
NS IRRIG 1000ML POUR BTL (IV SOLUTION) ×6 IMPLANT
PACK CV ACCESS (CUSTOM PROCEDURE TRAY) ×3 IMPLANT
PAD ARMBOARD 7.5X6 YLW CONV (MISCELLANEOUS) ×6 IMPLANT
SET BASIN LINEN APH (SET/KITS/TRAYS/PACK) ×3 IMPLANT
SUT VIC AB 3-0 SH 27 (SUTURE) ×3
SUT VIC AB 3-0 SH 27X BRD (SUTURE) ×1 IMPLANT
UNDERPAD 30X36 HEAVY ABSORB (UNDERPADS AND DIAPERS) ×3 IMPLANT

## 2020-02-06 NOTE — Op Note (Signed)
    OPERATIVE REPORT  DATE OF SURGERY: 02/06/2020  PATIENT: Lawrence Bonilla, 69 y.o. male MRN: [id number]  DOB: 12/20/1950  PRE-OPERATIVE DIAGNOSIS: End-stage renal disease  POST-OPERATIVE DIAGNOSIS:  Same  PROCEDURE: Ligation competing branches of left arm AV fistula  SURGEON:  Curt Jews, M.D.  PHYSICIAN ASSISTANT: Nurse  The assistant was needed for exposure and to expedite the case  ANESTHESIA: Local with sedation  EBL: per anesthesia record  Total I/O In: 400 [I.V.:300; IV Piggyback:100] Out: -   BLOOD ADMINISTERED: none  DRAINS: none  SPECIMEN: none  COUNTS CORRECT:  YES  PATIENT DISPOSITION:  PACU - hemodynamically stable  PROCEDURE DETAILS: Patient had a prior placed left radiocephalic fistula.  He had 2 large competing branches in the fistula was slow to mature.  I recommended ligation of these.  He was taken the operating placed supine position where the area of the left arm was prepped draped you sterile fashion.  Using local anesthesia 2 small incisions were made over these branches and they were ligated with 3-0 silk ties.  The wounds were irrigated with saline.  Hemostasis electrocautery.  Wounds were closed with 3-0 Vicryl in the subcutaneous septic tissue.  Sterile dressing was applied the patient transferred to the recovery room in stable condition   Rosetta Posner, M.D., [facility] 02/06/2020 10:20 AM

## 2020-02-06 NOTE — Discharge Instructions (Signed)
[facility]  Discharge Instructions  AV Fistula or Graft Surgery for Dialysis Access  Please refer to the following instructions for your post-procedure care. Your surgeon or physician assistant will discuss any changes with you.  Activity  You may drive the day following your surgery, if you are comfortable and no longer taking prescription pain medication. Resume full activity as the soreness in your incision resolves.  Bathing/Showering  You may shower after you go home. Keep your incision dry for 48 hours. Do not soak in a bathtub, hot tub, or swim until the incision heals completely. You may not shower if you have a hemodialysis catheter.  Incision Care  Clean your incision with mild soap and water after 48 hours. Pat the area dry with a clean towel. You do not need a bandage unless otherwise instructed. Do not apply any ointments or creams to your incision. You may have skin glue on your incision. Do not peel it off. It will come off on its own in about one week. Your arm may swell a bit after surgery. To reduce swelling use pillows to elevate your arm so it is above your heart. Your doctor will tell you if you need to lightly wrap your arm with an ACE bandage.  Diet  Resume your normal diet. There are not special food restrictions following this procedure. In order to heal from your surgery, it is CRITICAL to get adequate nutrition. Your body requires vitamins, minerals, and protein. Vegetables are the best source of vitamins and minerals. Vegetables also provide the perfect balance of protein. Processed food has little nutritional value, so try to avoid this.  Medications  Resume taking all of your medications. If your incision is causing pain, you may take over-the counter pain relievers such as acetaminophen (Tylenol). If you were prescribed a stronger pain medication, please be aware these medications can cause nausea and constipation. Prevent  nausea by taking the medication with a snack or meal. Avoid constipation by drinking plenty of fluids and eating foods with high amount of fiber, such as fruits, vegetables, and grains.  Do not take Tylenol if you are taking prescription pain medications.  Follow up Your surgeon may want to see you in the office following your access surgery. If so, this will be arranged at the time of your surgery.  Please call us immediately for any of the following conditions:   Increased pain, redness, drainage (pus) from your incision site  Fever of 101 degrees or higher  Severe or worsening pain at your incision site  Hand pain or numbness.   Reduce your risk of vascular disease:   Stop smoking. If you would like help, call [facility] at 1-800-QUIT-NOW 9808009106) or [place] [facility] [place] your cholesterol  Maintain a desired weight  Control your diabetes  Keep your blood pressure down  Dialysis  It will take several weeks to several months for your new dialysis access to be ready for use. Your surgeon will determine when it is okay to use it. Your nephrologist will continue to direct your dialysis. You can continue to use your Permcath until your new access is ready for use.   02/06/2020 Lawrence Bonilla [id number] [birth year]  Surgeon(s): Early, Arvilla Meres, MD  Procedure(s): LIGATION OF COMPETING BRANCHES OF LEFT ARM ARTERIOVENOUS FISTULA           Do not stick avf for 4 weeks    If you have any questions, please  call the office at [phone].  Monitored Anesthesia Care, Care After These instructions provide you with information about caring for yourself after your procedure. Your health care provider may also give you more specific instructions. Your treatment has been planned according to current medical practices, but problems sometimes occur. Call your health care provider if you have any problems or questions after your procedure. What can I expect  after the procedure? After your procedure, you may:  Feel sleepy for several hours.  Feel clumsy and have poor balance for several hours.  Feel forgetful about what happened after the procedure.  Have poor judgment for several hours.  Feel nauseous or vomit.  Have a sore throat if you had a breathing tube during the procedure. Follow these instructions at home: For at least 24 hours after the procedure:      Have a responsible adult stay with you. It is important to have someone help care for you until you are awake and alert.  Rest as needed.  Do not: ? Participate in activities in which you could fall or become injured. ? Drive. ? Use heavy machinery. ? Drink alcohol. ? Take sleeping pills or medicines that cause drowsiness. ? Make important decisions or sign legal documents. ? Take care of children on your own. Eating and drinking  Follow the diet that is recommended by your health care provider.  If you vomit, drink water, juice, or soup when you can drink without vomiting.  Make sure you have little or no nausea before eating solid foods. General instructions  Take over-the-counter and prescription medicines only as told by your health care provider.  If you have sleep apnea, surgery and certain medicines can increase your risk for breathing problems. Follow instructions from your health care provider about wearing your sleep device: ? Anytime you are sleeping, including during daytime naps. ? While taking prescription pain medicines, sleeping medicines, or medicines that make you drowsy.  If you smoke, do not smoke without supervision.  Keep all follow-up visits as told by your health care provider. This is important. Contact a health care provider if:  You keep feeling nauseous or you keep vomiting.  You feel light-headed.  You develop a rash.  You have a fever. Get help right away if:  You have trouble breathing. Summary  For several hours after  your procedure, you may feel sleepy and have poor judgment.  Have a responsible adult stay with you for at least 24 hours or until you are awake and alert. This information is not intended to replace advice given to you by your health care provider. Make sure you discuss any questions you have with your health care provider. Document Revised: 08/07/2017 Document Reviewed: 08/30/2015 Elsevier Patient Education  [place].

## 2020-02-06 NOTE — Anesthesia Postprocedure Evaluation (Signed)
Anesthesia Post Note  Patient: Lawrence Bonilla  Procedure(s) Performed: LIGATION OF COMPETING BRANCHES OF LEFT ARM ARTERIOVENOUS FISTULA (Left )  Patient location during evaluation: PACU Anesthesia Type: General Level of consciousness: awake and alert and oriented Pain management: pain level controlled Vital Signs Assessment: post-procedure vital signs reviewed and stable Respiratory status: spontaneous breathing, nonlabored ventilation and respiratory function stable Cardiovascular status: blood pressure returned to baseline Postop Assessment: no apparent nausea or vomiting Anesthetic complications: no   No complications documented.   Last Vitals:  Vitals:   02/06/20 1011 02/06/20 1015  BP: 104/69 101/76  Pulse: 75 63  Resp: 11 (!) 0  Temp: (!) 36.3 C   SpO2: 100% 99%    Last Pain:  Vitals:   02/06/20 1011  TempSrc:   PainSc: 0-No pain                 Orlie Dakin

## 2020-02-06 NOTE — Transfer of Care (Signed)
Immediate Anesthesia Transfer of Care Note  Patient: Lawrence Bonilla  Procedure(s) Performed: LIGATION OF COMPETING BRANCHES OF LEFT ARM ARTERIOVENOUS FISTULA (Left )  Patient Location: PACU  Anesthesia Type:General  Level of Consciousness: awake, alert , oriented and patient cooperative  Airway & Oxygen Therapy: Patient Spontanous Breathing and Patient connected to face mask oxygen  Post-op Assessment: Report given to RN and Post -op Vital signs reviewed and stable  Post vital signs: Reviewed and stable  Last Vitals:  Vitals Value Taken Time  BP 104/69 02/06/20 1011  Temp    Pulse 71 02/06/20 1012  Resp 11 02/06/20 1012  SpO2 100 % 02/06/20 1012  Vitals shown include unvalidated device data.  Last Pain:  Vitals:   02/06/20 0801  TempSrc: Oral  PainSc: 0-No pain      Patients Stated Pain Goal: 6 (31/28/11 8867)  Complications: No complications documented.

## 2020-02-06 NOTE — Interval H&P Note (Signed)
History and Physical Interval Note:  02/06/2020 9:11 AM  Lawrence Bonilla  has presented today for surgery, with the diagnosis of END STAGE RENAL DISEASE.  The various methods of treatment have been discussed with the patient and family. After consideration of risks, benefits and other options for treatment, the patient has consented to  Procedure(s): LIGATION OF COMPETING BRANCHES OF ARTERIOVENOUS FISTULA (Left) as a surgical intervention.  The patient's history has been reviewed, patient examined, no change in status, stable for surgery.  I have reviewed the patient's chart and labs.  Questions were answered to the patient's satisfaction.     Curt Jews

## 2020-02-06 NOTE — Anesthesia Procedure Notes (Signed)
Procedure Name: MAC Date/Time: 02/06/2020 9:54 AM Performed by: Orlie Dakin, CRNA Pre-anesthesia Checklist: Patient identified, Emergency Drugs available, Suction available and Patient being monitored Patient Re-evaluated:Patient Re-evaluated prior to induction Oxygen Delivery Method: Simple face mask Preoxygenation: Pre-oxygenation with 100% oxygen Placement Confirmation: positive ETCO2

## 2020-02-07 ENCOUNTER — Encounter (HOSPITAL_COMMUNITY): Payer: Self-pay | Admitting: Vascular Surgery

## 2020-02-08 DIAGNOSIS — N2581 Secondary hyperparathyroidism of renal origin: Secondary | ICD-10-CM | POA: Diagnosis not present

## 2020-02-08 DIAGNOSIS — Z992 Dependence on renal dialysis: Secondary | ICD-10-CM | POA: Diagnosis not present

## 2020-02-08 DIAGNOSIS — N186 End stage renal disease: Secondary | ICD-10-CM | POA: Diagnosis not present

## 2020-02-10 ENCOUNTER — Ambulatory Visit (INDEPENDENT_AMBULATORY_CARE_PROVIDER_SITE_OTHER): Payer: Medicare HMO | Admitting: Internal Medicine

## 2020-02-11 DIAGNOSIS — N2581 Secondary hyperparathyroidism of renal origin: Secondary | ICD-10-CM | POA: Diagnosis not present

## 2020-02-11 DIAGNOSIS — N186 End stage renal disease: Secondary | ICD-10-CM | POA: Diagnosis not present

## 2020-02-11 DIAGNOSIS — Z992 Dependence on renal dialysis: Secondary | ICD-10-CM | POA: Diagnosis not present

## 2020-02-13 ENCOUNTER — Other Ambulatory Visit (INDEPENDENT_AMBULATORY_CARE_PROVIDER_SITE_OTHER): Payer: Self-pay

## 2020-02-13 DIAGNOSIS — N2581 Secondary hyperparathyroidism of renal origin: Secondary | ICD-10-CM | POA: Diagnosis not present

## 2020-02-13 DIAGNOSIS — Z992 Dependence on renal dialysis: Secondary | ICD-10-CM | POA: Diagnosis not present

## 2020-02-13 DIAGNOSIS — N186 End stage renal disease: Secondary | ICD-10-CM | POA: Diagnosis not present

## 2020-02-13 MED ORDER — POTASSIUM CHLORIDE CRYS ER 10 MEQ PO TBCR
10.0000 meq | EXTENDED_RELEASE_TABLET | Freq: Every day | ORAL | 1 refills | Status: DC
Start: 2020-02-13 — End: 2020-02-18

## 2020-02-15 DIAGNOSIS — Z992 Dependence on renal dialysis: Secondary | ICD-10-CM | POA: Diagnosis not present

## 2020-02-15 DIAGNOSIS — N2581 Secondary hyperparathyroidism of renal origin: Secondary | ICD-10-CM | POA: Diagnosis not present

## 2020-02-15 DIAGNOSIS — N186 End stage renal disease: Secondary | ICD-10-CM | POA: Diagnosis not present

## 2020-02-18 ENCOUNTER — Other Ambulatory Visit (INDEPENDENT_AMBULATORY_CARE_PROVIDER_SITE_OTHER): Payer: Self-pay | Admitting: Internal Medicine

## 2020-02-18 ENCOUNTER — Ambulatory Visit (INDEPENDENT_AMBULATORY_CARE_PROVIDER_SITE_OTHER): Payer: Medicare HMO | Admitting: Nurse Practitioner

## 2020-02-18 DIAGNOSIS — Z992 Dependence on renal dialysis: Secondary | ICD-10-CM | POA: Diagnosis not present

## 2020-02-18 DIAGNOSIS — N2581 Secondary hyperparathyroidism of renal origin: Secondary | ICD-10-CM | POA: Diagnosis not present

## 2020-02-18 DIAGNOSIS — N186 End stage renal disease: Secondary | ICD-10-CM | POA: Diagnosis not present

## 2020-02-18 MED ORDER — POTASSIUM CHLORIDE CRYS ER 10 MEQ PO TBCR
10.0000 meq | EXTENDED_RELEASE_TABLET | Freq: Every day | ORAL | 1 refills | Status: DC
Start: 2020-02-18 — End: 2020-05-28

## 2020-02-19 ENCOUNTER — Ambulatory Visit (INDEPENDENT_AMBULATORY_CARE_PROVIDER_SITE_OTHER): Payer: Medicare HMO | Admitting: Nurse Practitioner

## 2020-02-19 ENCOUNTER — Encounter (INDEPENDENT_AMBULATORY_CARE_PROVIDER_SITE_OTHER): Payer: Self-pay | Admitting: Nurse Practitioner

## 2020-02-19 ENCOUNTER — Other Ambulatory Visit: Payer: Self-pay

## 2020-02-19 VITALS — BP 110/62 | HR 70 | Temp 97.9°F | Resp 19 | Ht 70.0 in | Wt 159.0 lb

## 2020-02-19 DIAGNOSIS — M542 Cervicalgia: Secondary | ICD-10-CM

## 2020-02-19 DIAGNOSIS — E559 Vitamin D deficiency, unspecified: Secondary | ICD-10-CM

## 2020-02-19 DIAGNOSIS — E039 Hypothyroidism, unspecified: Secondary | ICD-10-CM

## 2020-02-19 DIAGNOSIS — R05 Cough: Secondary | ICD-10-CM | POA: Diagnosis not present

## 2020-02-19 DIAGNOSIS — R0981 Nasal congestion: Secondary | ICD-10-CM | POA: Diagnosis not present

## 2020-02-19 DIAGNOSIS — N184 Chronic kidney disease, stage 4 (severe): Secondary | ICD-10-CM | POA: Diagnosis not present

## 2020-02-19 DIAGNOSIS — E1122 Type 2 diabetes mellitus with diabetic chronic kidney disease: Secondary | ICD-10-CM | POA: Diagnosis not present

## 2020-02-19 DIAGNOSIS — R059 Cough, unspecified: Secondary | ICD-10-CM

## 2020-02-19 DIAGNOSIS — G4733 Obstructive sleep apnea (adult) (pediatric): Secondary | ICD-10-CM | POA: Diagnosis not present

## 2020-02-19 MED ORDER — FLUTICASONE PROPIONATE 50 MCG/ACT NA SUSP: 1.0000 | Freq: Every day | NASAL | 0 refills | Status: DC

## 2020-02-19 NOTE — Progress Notes (Signed)
Subjective:  Patient ID: Lawrence Bonilla, male    DOB: May 28, 1950  Age: 69 y.o. MRN: [id number]  CC:  Chief Complaint  Patient presents with  . Neck Pain  . Shoulder Pain  . Congestive Heart Failure  . Chronic Kidney Disease  . Hypothyroidism  . Diabetes  . Other    Vitamin D deficiency, cough      HPI  This patient arrives today for the above.  Neck pain/shoulder pain: He has been experiencing some intermittent neck and shoulder pain over the last 2 weeks.  He does not remember any traumatic events or triggering factors that initially started the pain.  He tells me the ache is worse in the evening.  When he lays down the pain goes away.  Lifting and some stretches seem to worsen the pain.  He tells me the pain is an achiness, and can be pretty significant in intensity, but will subside with rest.  CHF: He was diagnosed with CHF approximately 1 year ago.  He does follow with cardiology regularly.  He tells me he is scheduled to see them next month.  He also has a history of atrial fibrillation.  He continues on Eliquis, furosemide, Imdur, and metoprolol.  He is also on antihypertensives for history of hypertension.  CKD/ESRD: He has started dialysis approximately 3 months ago.  He does follow with nephrology on a regular basis.  Hypothyroidism: He continues on Armour Thyroid for treatment of his hypothyroidism.  He is due for thyroid panel check today.  Diabetes: He has a history of type 2 diabetes which essentially resolved after he underwent bariatric surgery a few years ago.  He is due for A1c to be checked today.  He is not on any medication to control his blood sugars.  He is on atorvastatin.  Vitamin D deficiency: He also has a history of vitamin D deficiency and continues on vitamin D supplement.  Cough: He tells me he has been having an intermittent cough the last 2 weeks.  He denies any shortness of breath that is worse than his baseline.  He has had some white sputum  production.  He denies any worsening shortness of breath with activity or swelling in his extremities.  He does continue on Lasix.  He denies any fevers.  He has been tested for COVID-19 and was found to be negative.  Past Medical History:  Diagnosis Date  . Atrial fibrillation ([place])   . CKD (chronic kidney disease) stage 3, GFR 30-59 ml/min   . Congestive heart failure (CHF) ([place])   . DM type 2 (diabetes mellitus, type 2) ([place])   . Essential hypertension   . Headache   . Hyperlipidemia   . Hypothyroidism   . Lymphedema   . OSA on CPAP   . PE (pulmonary thromboembolism) ([place])    High probability for PE on VQ scan 01/29/19  . PVD (peripheral vascular disease) ([place])   . Secondary cardiomyopathy ([place])   . Stroke ([place])   . Vitamin D deficiency disease   . Wears glasses       Family History  Problem Relation Age of Onset  . 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   Social History   Tobacco Use  . Smoking status: Never Smoker  . Smokeless tobacco: Never Used  Substance Use Topics  . Alcohol use: Yes  Comment: 2 SCOTCH      Current Meds  Medication Sig  . apixaban (ELIQUIS) 5 MG TABS tablet Take 1 tablet (5 mg total) by mouth 2 (two) times daily.  Marland Kitchen atorvastatin (LIPITOR) 40 MG tablet Take 40 mg by mouth daily.  Lorin Picket 1 GM 210 MG(Fe) tablet Take 420 mg by mouth 2 (two) times daily.   . calcitRIOL (ROCALTROL) 0.25 MCG capsule Take 0.25 mcg by mouth daily.  . Calcium Carb-Cholecalciferol (CALCIUM 600 + D PO) Take 600 mg by mouth daily.   . furosemide (LASIX) 40 MG tablet Take 2 tablets (80 mg total) by mouth 2 (two) times daily.  . hydrALAZINE (APRESOLINE) 10 MG tablet TAKE 1 TABLET(10 MG) BY MOUTH TWICE DAILY (Patient taking differently: Take 10 mg by mouth 2 (two) times daily. )  . isosorbide mononitrate (IMDUR) 30 MG 24 hr tablet Take 1  tablet (30 mg total) by mouth daily.  . metoprolol succinate (TOPROL-XL) 50 MG 24 hr tablet Take 1 tablet (50 mg total) by mouth daily. Take with or immediately following a meal.  . potassium chloride (KLOR-CON) 10 MEQ tablet Take 1 tablet (10 mEq total) by mouth daily.  Marland Kitchen thyroid (ARMOUR THYROID) 60 MG tablet Take 60 mg by mouth daily before breakfast.    ROS:  Review of Systems  Constitutional: Positive for malaise/fatigue.  Respiratory: Positive for cough, sputum production (white) and shortness of breath (determined by activity level).   Neurological: Positive for tremors. Negative for sensory change and weakness.     Objective:   Today's Vitals: BP 110/62 (BP Location: Right Arm, Patient Position: Sitting, Cuff Size: Normal)   Pulse 70   Temp 97.9 F (36.6 C) (Temporal)   Resp 19   Ht _0  (1.778 m)   Wt 159 lb (72.1 kg)   SpO2 98%   BMI 22.81 kg/m  Vitals with BMI 02/19/2020 02/06/2020 02/06/2020  Height _1  - -  Weight 159 lbs - -  BMI 03.00 - -  Systolic 923 300 762  Diastolic 62 71 72  Pulse 70 - 66     Physical Exam Vitals reviewed.  Constitutional:      Appearance: Normal appearance.  HENT:     Head: Normocephalic and atraumatic.  Cardiovascular:     Rate and Rhythm: Normal rate and regular rhythm.     Heart sounds: Normal heart sounds.     Arteriovenous access: left arteriovenous access is present. Pulmonary:     Effort: Pulmonary effort is normal.     Breath sounds: Normal breath sounds.  Musculoskeletal:     Cervical back: Neck supple. No bony tenderness.     Thoracic back: Deformity (kyphosis) present. No bony tenderness.     Right lower leg: No edema.     Left lower leg: No edema.  Skin:    General: Skin is warm and dry.  Neurological:     Mental Status: He is alert and oriented to person, place, and time.     Sensory: Sensation is intact.     Motor: Motor function is intact.     Coordination: Coordination is intact.     Gait: Gait is  intact.     Deep Tendon Reflexes:     Reflex Scores:      Brachioradialis reflexes are 1+ on the right side and 1+ on the left side. Psychiatric:        Mood and Affect: Mood normal.        Behavior: Behavior  normal.        Thought Content: Thought content normal.        Judgment: Judgment normal.          Assessment and Plan   1. Nasal congestion   2. Hypothyroidism, adult   3. Type 2 diabetes mellitus with stage 4 chronic kidney disease, without long-term current use of insulin ([place])   4. Vitamin D deficiency disease   5. CKD (chronic kidney disease), stage IV ([place])   6. Cough   7. Neck pain      Plan: 1.,  6.  We did discuss sending him for chest x-ray today for further evaluation.  He would prefer to hold off on this at this time.  He would like to try Flonase nasal spray to see if this helps with his nasal congestion.  Prescription sent to pharmacy.  He was told to take 1 spray in each nostril daily.  He was told if his symptoms worsen especially experiences worsening cough, shortness of breath, or swelling to call this office immediately.  He was told if the office is closed he is to call the on-call doctor.  He is agreeable to this. 2.  We'll check Thyroid panel today for further evaluation. 3., 5.  We'll check A1c today as well as metabolic panel.  He will follow-up with nephrology and continue with dialysis as scheduled.  I do not see where he has Medicaid.  I believe in the state he qualifies for Medicaid because he is on hemodialysis.  I will send referral to [facility] for assistance with this, and I have encouraged him to discuss this with the social worker at the dialysis center as well. 4.  We'll check vitamin D serum level today. 7.  No red flags noted in HPI nor on exam.  We did discuss possibly checking x-ray of upper back to look for stress fracture.  Considered also sending for bone density scan to screen for osteoporosis.  Patient tells me currently he is trying to get  a good routine now with dialysis in his kidney treatment and would prefer to hold off on these testing for now.  I recommended he take Tylenol if needed up to every 8 hours for pain.  Otherwise recommended to continue resting as needed.  He was told if symptoms persist and if the pain gets worse, he experiences numbness, or experience weakness to let me know.  He tells me he understands.   Tests ordered Orders Placed This Encounter  Procedures  . TSH  . T3, Free  . T4, Free  . Vitamin D, 25-hydroxy  . CMP with eGFR([facility])  . Hemoglobin A1c  . AMB Referral to [facility]      Meds ordered this encounter  Medications  . fluticasone (FLONASE) 50 MCG/ACT nasal spray    Sig: Place 1 spray into both nostrils daily.    Dispense:  16 g    Refill:  0    Order Specific Question:   Supervising Provider    Answer:   Doree Albee [[id number]]    Patient to follow-up in 3 months or sooner as needed.  Ailene Ards, NP

## 2020-02-19 NOTE — Patient Instructions (Addendum)
If you need anything over the weekend please call the on call number: [phone] press 0 for operator and ask for "the doctor on-call for Dr. Anastasio Champion" and they will get a provider to speak to you.  Tylenol 350mg  by mouth every 8 hours as needed for pain  If you have worsening cough or shortness of breath call this office of the on-call doctor if outside of business hours. Also call if you experience worsening pain, weakness, numbness, or tingling in upper arms

## 2020-02-20 ENCOUNTER — Other Ambulatory Visit: Payer: Self-pay

## 2020-02-20 ENCOUNTER — Encounter (INDEPENDENT_AMBULATORY_CARE_PROVIDER_SITE_OTHER): Payer: Self-pay

## 2020-02-20 DIAGNOSIS — N186 End stage renal disease: Secondary | ICD-10-CM | POA: Diagnosis not present

## 2020-02-20 DIAGNOSIS — N2581 Secondary hyperparathyroidism of renal origin: Secondary | ICD-10-CM | POA: Diagnosis not present

## 2020-02-20 DIAGNOSIS — Z992 Dependence on renal dialysis: Secondary | ICD-10-CM | POA: Diagnosis not present

## 2020-02-20 DIAGNOSIS — I129 Hypertensive chronic kidney disease with stage 1 through stage 4 chronic kidney disease, or unspecified chronic kidney disease: Secondary | ICD-10-CM | POA: Diagnosis not present

## 2020-02-20 LAB — COMPLETE METABOLIC PANEL WITH GFR
AG Ratio: 1.5 (calc) (ref 1.0–2.5)
ALT: 12 U/L (ref 9–46)
AST: 22 U/L (ref 10–35)
Albumin: 4.4 g/dL (ref 3.6–5.1)
Alkaline phosphatase (APISO): 84 U/L (ref 35–144)
BUN/Creatinine Ratio: 8 (calc) (ref 6–22)
BUN: 44 mg/dL — ABNORMAL HIGH (ref 7–25)
CO2: 29 mmol/L (ref 20–32)
Calcium: 9.6 mg/dL (ref 8.6–10.3)
Chloride: 99 mmol/L (ref 98–110)
Creat: 5.39 mg/dL — ABNORMAL HIGH (ref 0.70–1.25)
GFR, Est African American: 12 mL/min/{1.73_m2} — ABNORMAL LOW (ref >=60)
GFR, Est Non African American: 10 mL/min/{1.73_m2} — ABNORMAL LOW (ref >=60)
Globulin: 3 g/dL (calc) (ref 1.9–3.7)
Glucose, Bld: 76 mg/dL (ref 65–99)
Potassium: 5.8 mmol/L — ABNORMAL HIGH (ref 3.5–5.3)
Sodium: 139 mmol/L (ref 135–146)
Total Bilirubin: 0.7 mg/dL (ref 0.2–1.2)
Total Protein: 7.4 g/dL (ref 6.1–8.1)

## 2020-02-20 LAB — HEMOGLOBIN A1C
Hgb A1c MFr Bld: 5.1 % of total Hgb (ref <5.7)
Mean Plasma Glucose: 100 (calc)
eAG (mmol/L): 5.5 (calc)

## 2020-02-20 LAB — T4, FREE: Free T4: 1.1 ng/dL (ref 0.8–1.8)

## 2020-02-20 LAB — VITAMIN D 25 HYDROXY (VIT D DEFICIENCY, FRACTURES): Vit D, 25-Hydroxy: 45 ng/mL (ref 30–100)

## 2020-02-20 LAB — T3, FREE: T3, Free: 3.3 pg/mL (ref 2.3–4.2)

## 2020-02-20 LAB — TSH: TSH: 4.52 mIU/L — ABNORMAL HIGH (ref 0.40–4.50)

## 2020-02-20 NOTE — Progress Notes (Signed)
mychart message was given in ehr.

## 2020-02-20 NOTE — Progress Notes (Signed)
Called left a detail message on phone voice mail.

## 2020-02-20 NOTE — Patient Outreach (Signed)
[facility]) Care Management  02/20/2020  Lawrence Bonilla [birth year] [id number]   Referral Date: 02/19/20 Referral Source: MD referral Referral Reason: Help with medicaid   Incoming call from patient .  Discussed reason for referral.  We discussed qualifications for medicaid.  Patient states he does not qualify as he is over the income limits and his spouse is still working.  We did discuss medications and cost as well. He states he is to meet with social worker Adela Lank at his dialysis center today and will be discussing medication assistance programs they have at this time.  Discussed [facility] services and support.  Patient declined and wants to discuss with social worker today before he gets [facility] involved at this time.  CM assured patient had CM contact for further reference.     Plan: RN CM will close case at this time.     Jone Baseman, RN, MSN [facility] Care Management Care Management Coordinator Direct Line [phone] Toll Free: [phone]  Fax: [phone]

## 2020-02-20 NOTE — Patient Outreach (Signed)
[facility]) Care Management  02/20/2020  ELCHANAN BOB 06-23-1950 [id number]   Referral Date: 02/19/20 Referral Source: MD referral  Referral Reason: Help with Medicaid   Outreach Attempt: No answer.  HIPAA compliant voice message left.   Plan: RN CM will attempt patient again within 4 business days and send letter.     Jone Baseman, RN, MSN [facility] Care Management Care Management Coordinator Direct Line [phone] Toll Free: [phone]  Fax: [phone]

## 2020-02-21 DIAGNOSIS — I872 Venous insufficiency (chronic) (peripheral): Secondary | ICD-10-CM | POA: Diagnosis not present

## 2020-02-22 DIAGNOSIS — N186 End stage renal disease: Secondary | ICD-10-CM | POA: Diagnosis not present

## 2020-02-22 DIAGNOSIS — Z992 Dependence on renal dialysis: Secondary | ICD-10-CM | POA: Diagnosis not present

## 2020-02-22 DIAGNOSIS — N2581 Secondary hyperparathyroidism of renal origin: Secondary | ICD-10-CM | POA: Diagnosis not present

## 2020-02-25 DIAGNOSIS — Z992 Dependence on renal dialysis: Secondary | ICD-10-CM | POA: Diagnosis not present

## 2020-02-25 DIAGNOSIS — N186 End stage renal disease: Secondary | ICD-10-CM | POA: Diagnosis not present

## 2020-02-25 DIAGNOSIS — N2581 Secondary hyperparathyroidism of renal origin: Secondary | ICD-10-CM | POA: Diagnosis not present

## 2020-02-27 DIAGNOSIS — N2581 Secondary hyperparathyroidism of renal origin: Secondary | ICD-10-CM | POA: Diagnosis not present

## 2020-02-27 DIAGNOSIS — N186 End stage renal disease: Secondary | ICD-10-CM | POA: Diagnosis not present

## 2020-02-27 DIAGNOSIS — Z992 Dependence on renal dialysis: Secondary | ICD-10-CM | POA: Diagnosis not present

## 2020-02-29 DIAGNOSIS — N186 End stage renal disease: Secondary | ICD-10-CM | POA: Diagnosis not present

## 2020-02-29 DIAGNOSIS — Z992 Dependence on renal dialysis: Secondary | ICD-10-CM | POA: Diagnosis not present

## 2020-02-29 DIAGNOSIS — N2581 Secondary hyperparathyroidism of renal origin: Secondary | ICD-10-CM | POA: Diagnosis not present

## 2020-03-02 ENCOUNTER — Telehealth (INDEPENDENT_AMBULATORY_CARE_PROVIDER_SITE_OTHER): Payer: Self-pay

## 2020-03-02 DIAGNOSIS — I10 Essential (primary) hypertension: Secondary | ICD-10-CM

## 2020-03-02 MED ORDER — METOPROLOL SUCCINATE ER 50 MG PO TB24: 50.0000 mg | ORAL_TABLET | Freq: Every day | ORAL | 1 refills | Status: DC

## 2020-03-02 NOTE — Telephone Encounter (Signed)
I have sent a refill of metoprolol to his pharmacy just in case.  As far as his Lorin Picket I would recommend he contact his nephrologist for those refills.  Thank you.

## 2020-03-02 NOTE — Telephone Encounter (Signed)
Called patient and LMOVM to return call  Left a detailed VM with the message from Judson Roch.

## 2020-03-02 NOTE — Telephone Encounter (Signed)
Patient called stating that he needs a refill of his Auryxia 210 mg (last filled by historical provider?) Also patient requests a refill of Metoprolol 50 mg which looks like he already has an updated refill at his pharmacy.  Last OV 02/19/2020  Auryxia 210mg  last filled 11/21/2019  Metoprolol 50 mg last filled 02/03/2020, # 90 with 1 refill  Please advise if OK to fill?

## 2020-03-03 DIAGNOSIS — Z992 Dependence on renal dialysis: Secondary | ICD-10-CM | POA: Diagnosis not present

## 2020-03-03 DIAGNOSIS — N186 End stage renal disease: Secondary | ICD-10-CM | POA: Diagnosis not present

## 2020-03-03 DIAGNOSIS — N2581 Secondary hyperparathyroidism of renal origin: Secondary | ICD-10-CM | POA: Diagnosis not present

## 2020-03-04 ENCOUNTER — Other Ambulatory Visit: Payer: Self-pay | Admitting: Nephrology

## 2020-03-04 DIAGNOSIS — N281 Cyst of kidney, acquired: Secondary | ICD-10-CM

## 2020-03-05 DIAGNOSIS — N186 End stage renal disease: Secondary | ICD-10-CM | POA: Diagnosis not present

## 2020-03-05 DIAGNOSIS — Z992 Dependence on renal dialysis: Secondary | ICD-10-CM | POA: Diagnosis not present

## 2020-03-05 DIAGNOSIS — N2581 Secondary hyperparathyroidism of renal origin: Secondary | ICD-10-CM | POA: Diagnosis not present

## 2020-03-07 DIAGNOSIS — Z992 Dependence on renal dialysis: Secondary | ICD-10-CM | POA: Diagnosis not present

## 2020-03-07 DIAGNOSIS — N186 End stage renal disease: Secondary | ICD-10-CM | POA: Diagnosis not present

## 2020-03-07 DIAGNOSIS — N2581 Secondary hyperparathyroidism of renal origin: Secondary | ICD-10-CM | POA: Diagnosis not present

## 2020-03-10 DIAGNOSIS — N2581 Secondary hyperparathyroidism of renal origin: Secondary | ICD-10-CM | POA: Diagnosis not present

## 2020-03-10 DIAGNOSIS — Z992 Dependence on renal dialysis: Secondary | ICD-10-CM | POA: Diagnosis not present

## 2020-03-10 DIAGNOSIS — N186 End stage renal disease: Secondary | ICD-10-CM | POA: Diagnosis not present

## 2020-03-12 DIAGNOSIS — Z992 Dependence on renal dialysis: Secondary | ICD-10-CM | POA: Diagnosis not present

## 2020-03-12 DIAGNOSIS — N186 End stage renal disease: Secondary | ICD-10-CM | POA: Diagnosis not present

## 2020-03-12 DIAGNOSIS — N2581 Secondary hyperparathyroidism of renal origin: Secondary | ICD-10-CM | POA: Diagnosis not present

## 2020-03-13 DIAGNOSIS — M79604 Pain in right leg: Secondary | ICD-10-CM | POA: Diagnosis not present

## 2020-03-13 DIAGNOSIS — M7989 Other specified soft tissue disorders: Secondary | ICD-10-CM | POA: Diagnosis not present

## 2020-03-13 DIAGNOSIS — R9389 Abnormal findings on diagnostic imaging of other specified body structures: Secondary | ICD-10-CM | POA: Diagnosis not present

## 2020-03-13 DIAGNOSIS — I724 Aneurysm of artery of lower extremity: Secondary | ICD-10-CM | POA: Diagnosis not present

## 2020-03-13 DIAGNOSIS — M7122 Synovial cyst of popliteal space [Baker], left knee: Secondary | ICD-10-CM | POA: Diagnosis not present

## 2020-03-13 DIAGNOSIS — R52 Pain, unspecified: Secondary | ICD-10-CM | POA: Diagnosis not present

## 2020-03-14 DIAGNOSIS — N2581 Secondary hyperparathyroidism of renal origin: Secondary | ICD-10-CM | POA: Diagnosis not present

## 2020-03-14 DIAGNOSIS — Z992 Dependence on renal dialysis: Secondary | ICD-10-CM | POA: Diagnosis not present

## 2020-03-14 DIAGNOSIS — N186 End stage renal disease: Secondary | ICD-10-CM | POA: Diagnosis not present

## 2020-03-16 ENCOUNTER — Other Ambulatory Visit: Payer: Self-pay

## 2020-03-16 ENCOUNTER — Ambulatory Visit (INDEPENDENT_AMBULATORY_CARE_PROVIDER_SITE_OTHER): Payer: Medicare HMO | Admitting: Vascular Surgery

## 2020-03-16 ENCOUNTER — Encounter: Payer: Self-pay | Admitting: Vascular Surgery

## 2020-03-16 VITALS — BP 107/71 | HR 74 | Temp 98.7°F | Resp 16 | Ht 70.0 in | Wt 159.0 lb

## 2020-03-16 DIAGNOSIS — Z992 Dependence on renal dialysis: Secondary | ICD-10-CM

## 2020-03-16 DIAGNOSIS — N186 End stage renal disease: Secondary | ICD-10-CM

## 2020-03-16 NOTE — Progress Notes (Signed)
                                      Vascular and Vein Specialist of [place]  Patient name: Lawrence Bonilla MRN: [id number] DOB: 03/11/1951 Sex: male  REASON FOR VISIT: Follow-up left forearm AV fistula creation  HPI: Lawrence Bonilla is a 69 y.o. male here today for follow-up.  He is currently undergoing dialysis via right IJ catheter.  He reports no difficulty with this.  He had initial left radiocephalic fistula creation by myself on 11/04/2019.  He had slow maturation of this and in mid September went back to the operating room and had ligation of 2 competing branches.  He is here today for follow-up.  Current Outpatient Medications  Medication Sig Dispense Refill  . apixaban (ELIQUIS) 5 MG TABS tablet Take 1 tablet (5 mg total) by mouth 2 (two) times daily. 60 tablet 3  . atorvastatin (LIPITOR) 40 MG tablet Take 40 mg by mouth daily.    Lorin Picket 1 GM 210 MG(Fe) tablet Take 420 mg by mouth 2 (two) times daily.     . calcitRIOL (ROCALTROL) 0.25 MCG capsule Take 0.25 mcg by mouth daily.    . Calcium Carb-Cholecalciferol (CALCIUM 600 + D PO) Take 600 mg by mouth daily.     . fluticasone (FLONASE) 50 MCG/ACT nasal spray Place 1 spray into both nostrils daily. 16 g 0  . furosemide (LASIX) 40 MG tablet Take 2 tablets (80 mg total) by mouth 2 (two) times daily. 360 tablet 1  . hydrALAZINE (APRESOLINE) 10 MG tablet TAKE 1 TABLET(10 MG) BY MOUTH TWICE DAILY (Patient taking differently: Take 10 mg by mouth 2 (two) times daily. ) 180 tablet 1  . isosorbide mononitrate (IMDUR) 30 MG 24 hr tablet Take 1 tablet (30 mg total) by mouth daily. 90 tablet 1  . metoprolol succinate (TOPROL-XL) 50 MG 24 hr tablet Take 1 tablet (50 mg total) by mouth daily. Take with or immediately following a meal. 90 tablet 1  . potassium chloride (KLOR-CON) 10 MEQ tablet Take 1 tablet (10 mEq total) by mouth daily. 90 tablet 1  . thyroid (ARMOUR THYROID) 60 MG tablet Take 60 mg by mouth daily before breakfast.     No current  facility-administered medications for this visit.     PHYSICAL EXAM: Vitals:   03/16/20 1040  BP: 107/71  Pulse: 74  Resp: 16  Temp: 98.7 F (37.1 C)  TempSrc: Other (Comment)  SpO2: 97%  Weight: 159 lb (72.1 kg)  Height: 5\' 10"  (1.778 m)    GENERAL: The patient is a well-nourished male, in no acute distress. The vital signs are documented above. His surgical incisions are all well-healed.  His cephalic vein is very superficial to the skin.  It runs a very straight course.  He has an excellent thrill.  Diameter is approximately 6 mm throughout its course.  MEDICAL ISSUES: I feel that he is reaches a maximal potential for his fistula.  It is of moderate diameter but otherwise is working perfectly.  I feel that it is safe to access his fistula at any time.  He will see [place] again on an as-needed basis   Rosetta Posner, MD [facility] Vascular and Vein Specialists of [facility] Tel [phone]

## 2020-03-17 DIAGNOSIS — N186 End stage renal disease: Secondary | ICD-10-CM | POA: Diagnosis not present

## 2020-03-17 DIAGNOSIS — N2581 Secondary hyperparathyroidism of renal origin: Secondary | ICD-10-CM | POA: Diagnosis not present

## 2020-03-17 DIAGNOSIS — Z992 Dependence on renal dialysis: Secondary | ICD-10-CM | POA: Diagnosis not present

## 2020-03-18 ENCOUNTER — Ambulatory Visit
Admission: RE | Admit: 2020-03-18 | Discharge: 2020-03-18 | Disposition: A | Discharge status: Home / Self Care | Payer: Medicare HMO | Source: Ambulatory Visit | Attending: Nephrology | Admitting: Nephrology

## 2020-03-18 DIAGNOSIS — N281 Cyst of kidney, acquired: Secondary | ICD-10-CM | POA: Diagnosis not present

## 2020-03-19 DIAGNOSIS — N186 End stage renal disease: Secondary | ICD-10-CM | POA: Diagnosis not present

## 2020-03-19 DIAGNOSIS — Z992 Dependence on renal dialysis: Secondary | ICD-10-CM | POA: Diagnosis not present

## 2020-03-19 DIAGNOSIS — N2581 Secondary hyperparathyroidism of renal origin: Secondary | ICD-10-CM | POA: Diagnosis not present

## 2020-03-20 DIAGNOSIS — G4733 Obstructive sleep apnea (adult) (pediatric): Secondary | ICD-10-CM | POA: Diagnosis not present

## 2020-03-20 DIAGNOSIS — Z992 Dependence on renal dialysis: Secondary | ICD-10-CM | POA: Diagnosis not present

## 2020-03-20 DIAGNOSIS — N186 End stage renal disease: Secondary | ICD-10-CM | POA: Diagnosis not present

## 2020-03-20 DIAGNOSIS — N2581 Secondary hyperparathyroidism of renal origin: Secondary | ICD-10-CM | POA: Diagnosis not present

## 2020-03-22 DIAGNOSIS — N186 End stage renal disease: Secondary | ICD-10-CM | POA: Diagnosis not present

## 2020-03-22 DIAGNOSIS — Z992 Dependence on renal dialysis: Secondary | ICD-10-CM | POA: Diagnosis not present

## 2020-03-22 DIAGNOSIS — I129 Hypertensive chronic kidney disease with stage 1 through stage 4 chronic kidney disease, or unspecified chronic kidney disease: Secondary | ICD-10-CM | POA: Diagnosis not present

## 2020-03-23 DIAGNOSIS — N186 End stage renal disease: Secondary | ICD-10-CM | POA: Diagnosis not present

## 2020-03-23 DIAGNOSIS — N2581 Secondary hyperparathyroidism of renal origin: Secondary | ICD-10-CM | POA: Diagnosis not present

## 2020-03-23 DIAGNOSIS — Z992 Dependence on renal dialysis: Secondary | ICD-10-CM | POA: Diagnosis not present

## 2020-03-25 DIAGNOSIS — N186 End stage renal disease: Secondary | ICD-10-CM | POA: Diagnosis not present

## 2020-03-25 DIAGNOSIS — Z992 Dependence on renal dialysis: Secondary | ICD-10-CM | POA: Diagnosis not present

## 2020-03-25 DIAGNOSIS — N2581 Secondary hyperparathyroidism of renal origin: Secondary | ICD-10-CM | POA: Diagnosis not present

## 2020-03-25 NOTE — Progress Notes (Signed)
**Note Lawrence-Identified via Obfuscation** Cardiology Office Note  Date: 03/26/2020   ID: Lawrence Bonilla, DOB May 27, 1950, MRN [id number]  PCP:  Ailene Ards, NP  Cardiologist:  Rozann Lesches, MD Electrophysiologist:  None   Chief Complaint  Patient presents with  . Cardiac follow-up    History of Present Illness: DEMARIAN Bonilla is a 69 y.o. male last seen in August.  He presents for a routine visit.  I reviewed interval records, he has not maintained follow-up in the heart failure clinic.  Renal disease has progressed to ESRD and he has been on hemodialysis in the interim, currently on Monday, Wednesday, and Friday schedule.  He had a recent visit with Dr. Donnetta Hutching in the vascular clinic.  He has been undergoing hemodialysis via right IJ catheter and also had creation of a left forearm AV fistula.  He states that they are now attempting to use his AV fistula.  Reports having some difficulty with this yesterday.  Also states that his blood pressures are usually low when he presents for sessions.  I reviewed his medications.  He has already been taken off hydralazine due to low blood pressures.  I asked him to stop Imdur for now.  We will try to keep him on Toprol-XL in light of his cardiomyopathy and atrial fibrillation.  May need to reduce the dose, I asked him to not take the medication on the morning that he presents for hemodialysis, rather after the session.  His last echocardiogram was in August 2020, we will plan an updated study.  Past Medical History:  Diagnosis Date  . Atrial fibrillation ([place])   . Congestive heart failure (CHF) ([place])   . DM type 2 (diabetes mellitus, type 2) ([place])   . ESRD on hemodialysis ([place])   . Essential hypertension   . Headache   . Hyperlipidemia   . Hypothyroidism   . Lymphedema   . OSA on CPAP   . PE (pulmonary thromboembolism) ([place])    High probability for PE on VQ scan 01/29/19  . PVD (peripheral vascular disease) ([place])   . Secondary cardiomyopathy ([place])   . Stroke ([place])   . Vitamin  D deficiency disease   . Wears glasses     Past Surgical History:  Procedure Laterality Date  . APPENDECTOMY    . AV FISTULA PLACEMENT Left 11/04/2019   Procedure: left radiocephalic ARTERIOVENOUS (AV) FISTULA CREATION;  Surgeon: Rosetta Posner, MD;  Location: [place];  Service: Vascular;  Laterality: Left;  . BARIATRIC SURGERY    . CATARACT EXTRACTION W/ INTRAOCULAR LENS  IMPLANT, BILATERAL    . HERNIA REPAIR    . LIGATION OF COMPETING BRANCHES OF ARTERIOVENOUS FISTULA Left 02/06/2020   Procedure: LIGATION OF COMPETING BRANCHES OF LEFT ARM ARTERIOVENOUS FISTULA;  Surgeon: Rosetta Posner, MD;  Location: [facility];  Service: Vascular;  Laterality: Left;  . RIGHT HEART CATH N/A 05/02/2019   Procedure: RIGHT HEART CATH;  Surgeon: Jolaine Artist, MD;  Location: [facility];  Service: Cardiovascular;  Laterality: N/A;  . TONSILLECTOMY    . WISDOM TOOTH EXTRACTION      Current Outpatient Medications  Medication Sig Dispense Refill  . apixaban (ELIQUIS) 5 MG TABS tablet Take 1 tablet (5 mg total) by mouth 2 (two) times daily. 60 tablet 3  . atorvastatin (LIPITOR) 40 MG tablet Take 40 mg by mouth daily.    Lorin Picket 1 GM 210 MG(Fe) tablet Take 420 mg by mouth 2 (two) times daily.     Marland Kitchen  calcitRIOL (ROCALTROL) 0.25 MCG capsule Take 0.25 mcg by mouth daily.    . Calcium Carb-Cholecalciferol (CALCIUM 600 + D PO) Take 600 mg by mouth daily.     . fluticasone (FLONASE) 50 MCG/ACT nasal spray Place 1 spray into both nostrils daily. 16 g 0  . furosemide (LASIX) 40 MG tablet Take 2 tablets (80 mg total) by mouth 2 (two) times daily. 360 tablet 1  . metoprolol succinate (TOPROL-XL) 50 MG 24 hr tablet Take 1 tablet (50 mg total) by mouth daily. Take with or immediately following a meal. 90 tablet 1  . potassium chloride (KLOR-CON) 10 MEQ tablet Take 1 tablet (10 mEq total) by mouth daily. 90 tablet 1  . thyroid (ARMOUR THYROID) 60 MG tablet Take 60 mg by mouth daily before breakfast.    . isosorbide  mononitrate (IMDUR) 30 MG 24 hr tablet TAKE 1 TABLET(30 MG) BY MOUTH DAILY 90 tablet 1   No current facility-administered medications for this visit.   Allergies:  Patient has no known allergies.   ROS: Lymphedema improved, still using compression stockings.  Physical Exam: VS:  BP 90/62   Pulse 76   Ht 6' (1.829 m)   Wt 161 lb (73 kg)   SpO2 93%   BMI 21.84 kg/m , BMI Body mass index is 21.84 kg/m.  Wt Readings from Last 3 Encounters:  03/26/20 161 lb (73 kg)  03/16/20 159 lb (72.1 kg)  02/19/20 159 lb (72.1 kg)    General: Patient appears comfortable at rest. HEENT: Conjunctiva and lids normal, wearing a mask. Neck: Supple, no JVD. Lungs: Clear to auscultation, nonlabored breathing at rest. Cardiac: Irregularly irregular, no S3, soft systolic murmur. Abdomen: Soft, bowel sounds present. Extremities: Lymphedema present but improved from previous baseline, compression stockings in place.  ECG:  An ECG dated 05/02/2019 was personally reviewed today and demonstrated:  Rate controlled atrial fibrillation with low voltage in the limb leads, nonspecific ST-T changes.  Recent Labwork: 04/26/2019: B Natriuretic Peptide >4,500.0; Platelets 91 02/06/2020: Hemoglobin 12.2 02/19/2020: ALT 12; AST 22; BUN 44; Creat 5.39; Potassium 5.8; Sodium 139; TSH 4.52   Other Studies Reviewed Today:  Right heart catheterization 05/02/2019: Findings:  RA = 18 RV = 66/14 PA = 69/30 (42) PCW = 24 Fick cardiac output/index = 4.1/1.9 Thermo CO/CI = 4.0/1.8 PVR = 4.5 WU Ao sat = 99% PA sat = 55%, 56%  Echocardiogram 01/10/2019: 1. The left ventricle has severely reduced systolic function, with an  ejection fraction of 25-30%. The cavity size was mildly dilated. Left  ventricular diastolic Doppler parameters are indeterminate.  2. The right ventricle has mildly reduced systolic function. The cavity  was moderately enlarged. There is no increase in right ventricular wall  thickness.  3.  Left atrial size was severely dilated.  4. Right atrial size was severely dilated.  5. No evidence of mitral valve stenosis.  6. Tricuspid valve regurgitation is moderate.  7. The aortic valve is tricuspid. No stenosis of the aortic valve.  8. The aorta is normal unless otherwise noted.  9. The aortic root is normal in size and structure.  10. Pulmonary hypertension is moderately elevated, PASP is 63 mmHg.   Assessment and Plan:  1.  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  Pursuing conservative management with medical therapy at this time, evaluation in the heart failure clinic noted with somewhat limited options.  Volume status significantly improved with hemodialysis, his lymphedema is also better overall.  He remains  on Lasix with potassium supplement, also Toprol-XL which I have asked him to take after his hemodialysis sessions instead of before hand.  Stopping Imdur, hydralazine was already discontinued due to low blood pressures.  We will obtain a follow-up echocardiogram for reassessment.  2.  ESRD on hemodialysis.  Still has right IJ catheter in place, recently attempting use of left forearm AV fistula.  3.  Permanent atrial fibrillation.  He remains on Eliquis for stroke prophylaxis, Toprol-XL providing good heart rate control.  Medication Adjustments/Labs and Tests Ordered: Current medicines are reviewed at length with the patient today.  Concerns regarding medicines are outlined above.   Tests Ordered: Orders Placed This Encounter  Procedures  . EKG 12-Lead  . ECHOCARDIOGRAM COMPLETE    Medication Changes: Meds ordered this encounter  Medications  . metoprolol succinate (TOPROL-XL) 50 MG 24 hr tablet    Sig: Take 1 tablet (50 mg total) by mouth daily. Take with or immediately following a meal.    Dispense:  90 tablet    Refill:  1    Disposition:  Follow up 3 months in the [place] office.  Signed, [name], MD, [facility] 03/26/2020 10:41 AM    [facility] [place], [place], [place] [address] Phone: [phone]; Fax: [phone]

## 2020-03-26 ENCOUNTER — Encounter: Payer: Self-pay | Admitting: Cardiology

## 2020-03-26 ENCOUNTER — Ambulatory Visit: Payer: Medicare HMO | Admitting: Cardiology

## 2020-03-26 ENCOUNTER — Other Ambulatory Visit (INDEPENDENT_AMBULATORY_CARE_PROVIDER_SITE_OTHER): Payer: Self-pay | Admitting: Nurse Practitioner

## 2020-03-26 VITALS — BP 90/62 | HR 76 | Ht 72.0 in | Wt 161.0 lb

## 2020-03-26 DIAGNOSIS — N186 End stage renal disease: Secondary | ICD-10-CM

## 2020-03-26 DIAGNOSIS — I10 Essential (primary) hypertension: Secondary | ICD-10-CM | POA: Diagnosis not present

## 2020-03-26 DIAGNOSIS — I5022 Chronic systolic (congestive) heart failure: Secondary | ICD-10-CM

## 2020-03-26 DIAGNOSIS — Z992 Dependence on renal dialysis: Secondary | ICD-10-CM | POA: Diagnosis not present

## 2020-03-26 DIAGNOSIS — I4821 Permanent atrial fibrillation: Secondary | ICD-10-CM

## 2020-03-26 MED ORDER — METOPROLOL SUCCINATE ER 50 MG PO TB24: 50.0000 mg | ORAL_TABLET | Freq: Every day | ORAL | 1 refills | Status: DC

## 2020-03-26 NOTE — Patient Instructions (Addendum)
Medication Instructions:   Your physician has recommended you make the following change in your medication:   Stop isosorbide mononitrate (imdur)  Continue other medications the same  Labwork:  None  Testing/Procedures: Your physician has requested that you have an echocardiogram. Echocardiography is a painless test that uses sound waves to create images of your heart. It provides your doctor with information about the size and shape of your heart and how well your heart's chambers and valves are working. This procedure takes approximately one hour. There are no restrictions for this procedure.  Follow-Up:  Your physician recommends that you schedule a follow-up appointment in: 3 months.  Any Other Special Instructions Will Be Listed Below (If Applicable).  If you need a refill on your cardiac medications before your next appointment, please call your pharmacy.

## 2020-03-27 DIAGNOSIS — N186 End stage renal disease: Secondary | ICD-10-CM | POA: Diagnosis not present

## 2020-03-27 DIAGNOSIS — N2581 Secondary hyperparathyroidism of renal origin: Secondary | ICD-10-CM | POA: Diagnosis not present

## 2020-03-27 DIAGNOSIS — Z992 Dependence on renal dialysis: Secondary | ICD-10-CM | POA: Diagnosis not present

## 2020-03-30 DIAGNOSIS — N186 End stage renal disease: Secondary | ICD-10-CM | POA: Diagnosis not present

## 2020-03-30 DIAGNOSIS — N2581 Secondary hyperparathyroidism of renal origin: Secondary | ICD-10-CM | POA: Diagnosis not present

## 2020-03-30 DIAGNOSIS — Z992 Dependence on renal dialysis: Secondary | ICD-10-CM | POA: Diagnosis not present

## 2020-04-01 DIAGNOSIS — N2581 Secondary hyperparathyroidism of renal origin: Secondary | ICD-10-CM | POA: Diagnosis not present

## 2020-04-01 DIAGNOSIS — N186 End stage renal disease: Secondary | ICD-10-CM | POA: Diagnosis not present

## 2020-04-01 DIAGNOSIS — Z992 Dependence on renal dialysis: Secondary | ICD-10-CM | POA: Diagnosis not present

## 2020-04-03 DIAGNOSIS — N186 End stage renal disease: Secondary | ICD-10-CM | POA: Diagnosis not present

## 2020-04-03 DIAGNOSIS — Z992 Dependence on renal dialysis: Secondary | ICD-10-CM | POA: Diagnosis not present

## 2020-04-03 DIAGNOSIS — N2581 Secondary hyperparathyroidism of renal origin: Secondary | ICD-10-CM | POA: Diagnosis not present

## 2020-04-06 DIAGNOSIS — Z992 Dependence on renal dialysis: Secondary | ICD-10-CM | POA: Diagnosis not present

## 2020-04-06 DIAGNOSIS — N2581 Secondary hyperparathyroidism of renal origin: Secondary | ICD-10-CM | POA: Diagnosis not present

## 2020-04-06 DIAGNOSIS — N186 End stage renal disease: Secondary | ICD-10-CM | POA: Diagnosis not present

## 2020-04-08 DIAGNOSIS — N186 End stage renal disease: Secondary | ICD-10-CM | POA: Diagnosis not present

## 2020-04-08 DIAGNOSIS — N2581 Secondary hyperparathyroidism of renal origin: Secondary | ICD-10-CM | POA: Diagnosis not present

## 2020-04-08 DIAGNOSIS — Z992 Dependence on renal dialysis: Secondary | ICD-10-CM | POA: Diagnosis not present

## 2020-04-10 DIAGNOSIS — N2581 Secondary hyperparathyroidism of renal origin: Secondary | ICD-10-CM | POA: Diagnosis not present

## 2020-04-10 DIAGNOSIS — N186 End stage renal disease: Secondary | ICD-10-CM | POA: Diagnosis not present

## 2020-04-10 DIAGNOSIS — Z992 Dependence on renal dialysis: Secondary | ICD-10-CM | POA: Diagnosis not present

## 2020-04-12 ENCOUNTER — Observation Stay (HOSPITAL_COMMUNITY)
Admission: EM | Admit: 2020-04-12 | Discharge: 2020-04-14 | Disposition: A | Discharge status: Home / Self Care | Payer: Medicare HMO | Attending: Family Medicine | Admitting: Family Medicine

## 2020-04-12 ENCOUNTER — Other Ambulatory Visit: Payer: Self-pay

## 2020-04-12 ENCOUNTER — Encounter (HOSPITAL_COMMUNITY): Payer: Self-pay | Admitting: Emergency Medicine

## 2020-04-12 DIAGNOSIS — E119 Type 2 diabetes mellitus without complications: Secondary | ICD-10-CM

## 2020-04-12 DIAGNOSIS — Y92009 Unspecified place in unspecified non-institutional (private) residence as the place of occurrence of the external cause: Secondary | ICD-10-CM | POA: Diagnosis not present

## 2020-04-12 DIAGNOSIS — I5023 Acute on chronic systolic (congestive) heart failure: Secondary | ICD-10-CM | POA: Diagnosis not present

## 2020-04-12 DIAGNOSIS — S062X1A Diffuse traumatic brain injury with loss of consciousness of 30 minutes or less, initial encounter: Principal | ICD-10-CM

## 2020-04-12 DIAGNOSIS — R55 Syncope and collapse: Secondary | ICD-10-CM

## 2020-04-12 DIAGNOSIS — N186 End stage renal disease: Secondary | ICD-10-CM

## 2020-04-12 DIAGNOSIS — E861 Hypovolemia: Secondary | ICD-10-CM | POA: Diagnosis not present

## 2020-04-12 DIAGNOSIS — I132 Hypertensive heart and chronic kidney disease with heart failure and with stage 5 chronic kidney disease, or end stage renal disease: Secondary | ICD-10-CM | POA: Diagnosis not present

## 2020-04-12 DIAGNOSIS — Z7901 Long term (current) use of anticoagulants: Secondary | ICD-10-CM | POA: Insufficient documentation

## 2020-04-12 DIAGNOSIS — E1122 Type 2 diabetes mellitus with diabetic chronic kidney disease: Secondary | ICD-10-CM | POA: Diagnosis not present

## 2020-04-12 DIAGNOSIS — E039 Hypothyroidism, unspecified: Secondary | ICD-10-CM | POA: Diagnosis not present

## 2020-04-12 DIAGNOSIS — S0121XA Laceration without foreign body of nose, initial encounter: Secondary | ICD-10-CM

## 2020-04-12 DIAGNOSIS — I9589 Other hypotension: Secondary | ICD-10-CM | POA: Insufficient documentation

## 2020-04-12 DIAGNOSIS — I5022 Chronic systolic (congestive) heart failure: Secondary | ICD-10-CM | POA: Diagnosis present

## 2020-04-12 DIAGNOSIS — R930 Abnormal findings on diagnostic imaging of skull and head, not elsewhere classified: Secondary | ICD-10-CM | POA: Diagnosis not present

## 2020-04-12 DIAGNOSIS — Z992 Dependence on renal dialysis: Secondary | ICD-10-CM | POA: Insufficient documentation

## 2020-04-12 DIAGNOSIS — W19XXXA Unspecified fall, initial encounter: Secondary | ICD-10-CM

## 2020-04-12 DIAGNOSIS — Z7189 Other specified counseling: Secondary | ICD-10-CM

## 2020-04-12 DIAGNOSIS — Z20822 Contact with and (suspected) exposure to covid-19: Secondary | ICD-10-CM | POA: Diagnosis not present

## 2020-04-12 DIAGNOSIS — I480 Paroxysmal atrial fibrillation: Secondary | ICD-10-CM | POA: Diagnosis not present

## 2020-04-12 DIAGNOSIS — S022XXA Fracture of nasal bones, initial encounter for closed fracture: Secondary | ICD-10-CM

## 2020-04-12 DIAGNOSIS — N2581 Secondary hyperparathyroidism of renal origin: Secondary | ICD-10-CM | POA: Diagnosis not present

## 2020-04-12 DIAGNOSIS — S06369A Traumatic hemorrhage of cerebrum, unspecified, with loss of consciousness of unspecified duration, initial encounter: Secondary | ICD-10-CM | POA: Diagnosis not present

## 2020-04-12 DIAGNOSIS — I10 Essential (primary) hypertension: Secondary | ICD-10-CM | POA: Diagnosis present

## 2020-04-12 DIAGNOSIS — S01511A Laceration without foreign body of lip, initial encounter: Secondary | ICD-10-CM

## 2020-04-12 DIAGNOSIS — Z79899 Other long term (current) drug therapy: Secondary | ICD-10-CM | POA: Insufficient documentation

## 2020-04-12 DIAGNOSIS — D539 Nutritional anemia, unspecified: Secondary | ICD-10-CM | POA: Diagnosis present

## 2020-04-12 DIAGNOSIS — Z043 Encounter for examination and observation following other accident: Secondary | ICD-10-CM | POA: Diagnosis not present

## 2020-04-12 DIAGNOSIS — S0990XA Unspecified injury of head, initial encounter: Secondary | ICD-10-CM | POA: Diagnosis present

## 2020-04-12 DIAGNOSIS — Z515 Encounter for palliative care: Secondary | ICD-10-CM

## 2020-04-12 DIAGNOSIS — M47812 Spondylosis without myelopathy or radiculopathy, cervical region: Secondary | ICD-10-CM | POA: Diagnosis not present

## 2020-04-12 DIAGNOSIS — G4733 Obstructive sleep apnea (adult) (pediatric): Secondary | ICD-10-CM

## 2020-04-12 HISTORY — DX: Chronic systolic (congestive) heart failure: I50.22

## 2020-04-12 LAB — CBC WITH DIFFERENTIAL/PLATELET
Abs Immature Granulocytes: 0.06 10*3/uL (ref 0.00–0.07)
Basophils Absolute: 0 10*3/uL (ref 0.0–0.1)
Basophils Relative: 0 %
Eosinophils Absolute: 0 10*3/uL (ref 0.0–0.5)
Eosinophils Relative: 0 %
HCT: 31.4 % — ABNORMAL LOW (ref 39.0–52.0)
Hemoglobin: 10.2 g/dL — ABNORMAL LOW (ref 13.0–17.0)
Immature Granulocytes: 1 %
Lymphocytes Relative: 8 %
Lymphs Abs: 0.5 10*3/uL — ABNORMAL LOW (ref 0.7–4.0)
MCH: 35.8 pg — ABNORMAL HIGH (ref 26.0–34.0)
MCHC: 32.5 g/dL (ref 30.0–36.0)
MCV: 110.2 fL — ABNORMAL HIGH (ref 80.0–100.0)
Monocytes Absolute: 0.5 10*3/uL (ref 0.1–1.0)
Monocytes Relative: 8 %
Neutro Abs: 4.9 10*3/uL (ref 1.7–7.7)
Neutrophils Relative %: 83 %
Platelets: 139 10*3/uL — ABNORMAL LOW (ref 150–400)
RBC: 2.85 MIL/uL — ABNORMAL LOW (ref 4.22–5.81)
RDW: 15.3 % (ref 11.5–15.5)
WBC: 5.9 10*3/uL (ref 4.0–10.5)
nRBC: 0 % (ref 0.0–0.2)

## 2020-04-12 LAB — COMPREHENSIVE METABOLIC PANEL
ALT: 20 U/L (ref 0–44)
AST: 26 U/L (ref 15–41)
Albumin: 2.9 g/dL — ABNORMAL LOW (ref 3.5–5.0)
Alkaline Phosphatase: 78 U/L (ref 38–126)
Anion gap: 12 (ref 5–15)
BUN: 33 mg/dL — ABNORMAL HIGH (ref 8–23)
CO2: 18 mmol/L — ABNORMAL LOW (ref 22–32)
Calcium: 7.9 mg/dL — ABNORMAL LOW (ref 8.9–10.3)
Chloride: 104 mmol/L (ref 98–111)
Creatinine, Ser: 3.45 mg/dL — ABNORMAL HIGH (ref 0.61–1.24)
GFR, Estimated: 18 mL/min — ABNORMAL LOW (ref >60)
Glucose, Bld: 145 mg/dL — ABNORMAL HIGH (ref 70–99)
Potassium: 4.4 mmol/L (ref 3.5–5.1)
Sodium: 134 mmol/L — ABNORMAL LOW (ref 135–145)
Total Bilirubin: 0.5 mg/dL (ref 0.3–1.2)
Total Protein: 6.4 g/dL — ABNORMAL LOW (ref 6.5–8.1)

## 2020-04-12 LAB — TROPONIN I (HIGH SENSITIVITY): Troponin I (High Sensitivity): 15 ng/L (ref <18)

## 2020-04-12 LAB — ETHANOL: Alcohol, Ethyl (B): 48 mg/dL — ABNORMAL HIGH (ref <10)

## 2020-04-12 MED ORDER — SODIUM CHLORIDE 0.9 % IV BOLUS
500.0000 mL | Freq: Once | INTRAVENOUS | Status: AC
  Administered 2020-04-12: 500 mL via INTRAVENOUS

## 2020-04-12 NOTE — ED Provider Notes (Signed)
[facility] EMERGENCY DEPARTMENT Provider Note   CSN: [id number] Arrival date & time: 04/12/20  2303   Time seen 1109 PM  History Chief Complaint  Patient presents with  . Loss of Consciousness    Lawrence Bonilla is a 69 y.o. male.  HPI   Patient presents via EMS after having an unwitnessed fall tonight.  Patient does not remember anything about what happened today.  He thinks he had dialysis today however today is Sunday.  He states he normally goes to dialysis on Monday, Wednesday, and Friday.  He thinks maybe he went yesterday, Saturday.  Per EMS family reported he does drink alcohol.  He is also on Eliquis.  He denies headache, chest pain, shortness of breath, nausea, vomiting, diarrhea but does feel dizzy and lightheaded.  PCP Ailene Ards, NP   Past Medical History:  Diagnosis Date  . Atrial fibrillation ([place])   . Chronic systolic CHF (congestive heart failure) ([place])   . Congestive heart failure (CHF) ([place])   . DM type 2 (diabetes mellitus, type 2) ([place])   . ESRD on hemodialysis ([place])   . Essential hypertension   . Headache   . Hyperlipidemia   . Hypothyroidism   . Lymphedema   . OSA on CPAP   . PE (pulmonary thromboembolism) ([place])    High probability for PE on VQ scan 01/29/19  . PVD (peripheral vascular disease) ([place])   . Secondary cardiomyopathy ([place])   . Stroke ([place])   . Vitamin D deficiency disease   . Wears glasses     Patient Active Problem List   Diagnosis Date Noted  . Abnormal CT of the head 04/13/2020  . Essential hypertension   . Macrocytic anemia   . OSA on CPAP   . ESRD on hemodialysis ([place])   . Chronic systolic CHF (congestive heart failure) ([place])   . Hypothyroidism, adult 02/06/2019  . DM type 2 (diabetes mellitus, type 2) ([place]) 02/06/2019  . Vitamin D deficiency disease 02/06/2019  . Acute on chronic systolic CHF (congestive heart failure) ([place]) 01/28/2019  . CKD (chronic kidney disease), stage IV ([place]) 01/28/2019  . Pleural effusion on  right 01/28/2019  . Stroke ([place]) 01/28/2019  . Supratherapeutic INR 01/28/2019    Past Surgical History:  Procedure Laterality Date  . APPENDECTOMY    . AV FISTULA PLACEMENT Left 11/04/2019   Procedure: left radiocephalic ARTERIOVENOUS (AV) FISTULA CREATION;  Surgeon: Rosetta Posner, MD;  Location: [facility];  Service: Vascular;  Laterality: Left;  . BARIATRIC SURGERY    . CATARACT EXTRACTION W/ INTRAOCULAR LENS  IMPLANT, BILATERAL    . HERNIA REPAIR    . LIGATION OF COMPETING BRANCHES OF ARTERIOVENOUS FISTULA Left 02/06/2020   Procedure: LIGATION OF COMPETING BRANCHES OF LEFT ARM ARTERIOVENOUS FISTULA;  Surgeon: Rosetta Posner, MD;  Location: [facility];  Service: Vascular;  Laterality: Left;  . RIGHT HEART CATH N/A 05/02/2019   Procedure: RIGHT HEART CATH;  Surgeon: Jolaine Artist, MD;  Location: [facility];  Service: Cardiovascular;  Laterality: N/A;  . TONSILLECTOMY    . WISDOM TOOTH EXTRACTION         Family History  Problem Relation Age of Onset  . Asthma Mother     Social History   Tobacco Use  . Smoking status: Never Smoker  . Smokeless tobacco: Never Used  Vaping Use  . Vaping Use: Never used  Substance Use Topics  . Alcohol use: Yes    Comment: 2 SCOTCH   .  Drug use: Never    Home Medications Prior to Admission medications   Medication Sig Start Date End Date Taking? Authorizing Provider  apixaban (ELIQUIS) 5 MG TABS tablet Take 1 tablet (5 mg total) by mouth 2 (two) times daily. 11/26/19   Doree Albee, MD  atorvastatin (LIPITOR) 40 MG tablet Take 40 mg by mouth daily.    [provider]  AURYXIA 1 GM 210 MG(Fe) tablet Take 420 mg by mouth 2 (two) times daily.  11/21/19   [provider]  calcitRIOL (ROCALTROL) 0.25 MCG capsule Take 0.25 mcg by mouth daily.    [provider]  Calcium Carb-Cholecalciferol (CALCIUM 600 + D PO) Take 600 mg by mouth daily.     [provider]  fluticasone (FLONASE) 50 MCG/ACT nasal spray  Place 1 spray into both nostrils daily. 02/19/20   Ailene Ards, NP  furosemide (LASIX) 40 MG tablet Take 2 tablets (80 mg total) by mouth 2 (two) times daily. 01/16/20   Doree Albee, MD  isosorbide mononitrate (IMDUR) 30 MG 24 hr tablet TAKE 1 TABLET(30 MG) BY MOUTH DAILY 03/26/20   Hurshel Party C, MD  metoprolol succinate (TOPROL-XL) 50 MG 24 hr tablet Take 1 tablet (50 mg total) by mouth daily. Take with or immediately following a meal. 03/26/20   [name], MD  potassium chloride (KLOR-CON) 10 MEQ tablet Take 1 tablet (10 mEq total) by mouth daily. 02/18/20   Doree Albee, MD  thyroid (ARMOUR THYROID) 60 MG tablet Take 60 mg by mouth daily before breakfast.    [provider]    Allergies    Patient has no known allergies.  Review of Systems   Review of Systems  All other systems reviewed and are negative.   Physical Exam Updated Vital Signs BP 90/66 (BP Location: Right Arm)   Pulse 70   Temp 98 F (36.7 C) (Oral)   Resp 18   Ht 6' (1.829 m)   Wt 73 kg   SpO2 99%   BMI 21.83 kg/m   Physical Exam Vitals and nursing note reviewed.  Constitutional:      Appearance: He is normal weight. He is not toxic-appearing.  HENT:     Head: Normocephalic.     Right Ear: External ear normal.     Left Ear: External ear normal.     Nose:     Comments: Patient's nose is not straight, patient cannot tell me that is old or new.  He does have some superficial abrasions on the right side of his nose, when I read his chart it appears he does wear glasses.  These are consistent with injuries from broken glasses. Eyes:     Extraocular Movements: Extraocular movements intact.     Conjunctiva/sclera: Conjunctivae normal.     Pupils: Pupils are equal, round, and reactive to light.  Cardiovascular:     Rate and Rhythm: Normal rate and regular rhythm.  Pulmonary:     Effort: Pulmonary effort is normal. No respiratory distress.     Breath sounds: Normal breath sounds.   Musculoskeletal:        General: No deformity. Normal range of motion.     Cervical back: Normal range of motion and neck supple.  Skin:    General: Skin is warm and dry.  Neurological:     General: No focal deficit present.     Mental Status: He is alert and oriented to person, place, and time.  Cranial Nerves: No cranial nerve deficit.  Psychiatric:        Mood and Affect: Mood normal.        Behavior: Behavior normal.        Thought Content: Thought content normal.         ED Results / Procedures / Treatments   Labs (all labs ordered are listed, but only abnormal results are displayed) Results for orders placed or performed during the hospital encounter of 04/12/20  Comprehensive metabolic panel  Result Value Ref Range   Sodium 134 (L) 135 - 145 mmol/L   Potassium 4.4 3.5 - 5.1 mmol/L   Chloride 104 98 - 111 mmol/L   CO2 18 (L) 22 - 32 mmol/L   Glucose, Bld 145 (H) 70 - 99 mg/dL   BUN 33 (H) 8 - 23 mg/dL   Creatinine, Ser 3.45 (H) 0.61 - 1.24 mg/dL   Calcium 7.9 (L) 8.9 - 10.3 mg/dL   Total Protein 6.4 (L) 6.5 - 8.1 g/dL   Albumin 2.9 (L) 3.5 - 5.0 g/dL   AST 26 15 - 41 U/L   ALT 20 0 - 44 U/L   Alkaline Phosphatase 78 38 - 126 U/L   Total Bilirubin 0.5 0.3 - 1.2 mg/dL   GFR, Estimated 18 (L) >60 mL/min   Anion gap 12 5 - 15  Ethanol  Result Value Ref Range   Alcohol, Ethyl (B) 48 (H) <10 mg/dL  CBC with Differential  Result Value Ref Range   WBC 5.9 4.0 - 10.5 K/uL   RBC 2.85 (L) 4.22 - 5.81 MIL/uL   Hemoglobin 10.2 (L) 13.0 - 17.0 g/dL   HCT 31.4 (L) 39 - 52 %   MCV 110.2 (H) 80.0 - 100.0 fL   MCH 35.8 (H) 26.0 - 34.0 pg   MCHC 32.5 30.0 - 36.0 g/dL   RDW 15.3 11.5 - 15.5 %   Platelets 139 (L) 150 - 400 K/uL   nRBC 0.0 0.0 - 0.2 %   Neutrophils Relative % 83 %   Neutro Abs 4.9 1.7 - 7.7 K/uL   Lymphocytes Relative 8 %   Lymphs Abs 0.5 (L) 0.7 - 4.0 K/uL   Monocytes Relative 8 %   Monocytes Absolute 0.5 0.1 - 1.0 K/uL   Eosinophils Relative 0 %    Eosinophils Absolute 0.0 0.0 - 0.5 K/uL   Basophils Relative 0 %   Basophils Absolute 0.0 0.0 - 0.1 K/uL   Immature Granulocytes 1 %   Abs Immature Granulocytes 0.06 0.00 - 0.07 K/uL  Troponin I (High Sensitivity)  Result Value Ref Range   Troponin I (High Sensitivity) 15 <18 ng/L  Troponin I (High Sensitivity)  Result Value Ref Range   Troponin I (High Sensitivity) 26 (H) <18 ng/L   Laboratory interpretation all normal except stable chronic renal failure, alcohol ingestion without intoxication, stable anemia, increase of the delta troponin    EKG None     ED ECG REPORT   Date: 04/13/2020  Rate: 75  Rhythm: atrial fibrillation  QRS Axis: left  Intervals: normal  ST/T Wave abnormalities:  T wave inversions laterally  Conduction Disutrbances:none  Narrative Interpretation:   Old EKG Reviewed: none available  I have personally reviewed the EKG tracing and agree with the computerized printout as noted.   Radiology CT Head Wo Contrast  CT Cervical Spine Wo Contrast  CT Maxillofacial WO CM  Result Date: 04/13/2020 CLINICAL DATA:  Unwitnessed fall tonight. Patient was found face down and  unconscious. Laceration to the right side of the nose. Facial trauma. EXAM: CT HEAD WITHOUT CONTRAST CT MAXILLOFACIAL WITHOUT CONTRAST CT CERVICAL SPINE WITHOUT CONTRAST TECHNIQUE: Multidetector CT imaging of the head, cervical spine, and maxillofacial structures were performed using the standard protocol without intravenous contrast. Multiplanar CT image reconstructions of the cervical spine and maxillofacial structures were also generated. COMPARISON:  CT head and cervical spine 01/28/2019 FINDINGS: CT HEAD FINDINGS Brain: Diffuse cerebral atrophy. Mild ventricular dilatation consistent with central atrophy. Low-attenuation changes throughout the deep white matter consistent with small vessel ischemia. No mass-effect or midline shift. No abnormal extra-axial fluid collections. The gray-white  matter junctions are distinct. Basal cisterns are not effaced. Focal area of increased attenuation along the medial aspect of the right frontal lobe and involving a medial sulcus. This likely represents a small surface petechial hemorrhage or small focal subarachnoid hemorrhage. Vascular: Moderate intracranial arterial vascular calcifications. Skull: The calvarium appears intact. No acute depressed fractures are identified. Focal lucency in the left parietal region is unchanged since prior study, likely represents a venous [place]. Other: None. CT MAXILLOFACIAL FINDINGS Osseous: Mildly depressed comminuted anterior nasal bone fractures. Orbital and facial bones, mandibles, and temporomandibular joints are otherwise intact. Orbits: Globes and extraocular muscles appear intact and symmetrical. Sinuses: Paranasal sinuses and mastoid air cells are clear. Soft tissues: Soft tissue swelling over the nose and extending to the right supraorbital region. Soft tissue swelling over the anterior mandible. CT CERVICAL SPINE FINDINGS Alignment: Normal alignment of the cervical vertebrae and facet joints. C1-2 articulation appears intact. Skull base and vertebrae: Skull base appears intact. No vertebral compression deformities. No focal bone lesion or bone destruction. Soft tissues and spinal canal: No prevertebral soft tissue swelling. No abnormal paraspinal soft tissue mass or infiltration. Disc levels: Degenerative changes with narrowed interspaces and endplate hypertrophic change, most prominent at C5-6 and C6-7 levels. Degenerative changes in the facet joints. Uncovertebral spurring causes neural foraminal encroachment at multiple levels bilaterally. Upper chest: Visualized lung apices are clear. Vascular calcifications. Other: None. IMPRESSION: CT HEAD: 1. Focal area of increased attenuation along the medial aspect of the right frontal lobe and involving a medial sulcus. This likely represents a small surface petechial  hemorrhage or small focal subarachnoid hemorrhage. 2. Chronic atrophy and small vessel ischemic changes. CT maxillofacial: Mildly depressed comminuted anterior nasal bone fractures. Soft tissue swelling over the nose and right supraorbital region. CT CERVICAL SPINE: Normal alignment of the cervical spine. Degenerative changes. No acute displaced fractures identified. Critical Value/emergent results were called by telephone at the time of interpretation on 04/13/2020 at 1:53 am to provider Beverlyann Broxterman , who verbally acknowledged these results. Electronically Signed   By: Lucienne Capers M.D.   On: 04/13/2020 01:56    Procedures .Marland KitchenLaceration Repair  Date/Time: 04/13/2020 5:13 AM Performed by: Rolland Porter, MD Authorized by: Rolland Porter, MD   Consent:    Consent obtained:  Verbal   Consent given by:  Patient Anesthesia (see MAR for exact dosages):    Anesthesia method:  Local infiltration   Local anesthetic:  Lidocaine 2% WITH epi Laceration details:    Location:  Lip   Lip location:  Lower exterior lip   Length (cm):  0.8   Laceration depth: through mucus membrane. Repair type:    Repair type:  Simple Pre-procedure details:    Preparation:  Imaging obtained to evaluate for foreign bodies and patient was prepped and draped in usual sterile fashion Exploration:    Hemostasis achieved with:  Direct  pressure   Wound extent: no foreign bodies/material noted and no vascular damage noted     Contaminated: no   Treatment:    Area cleansed with:  Saline   Amount of cleaning:  Standard Skin repair:    Repair method:  Sutures   Suture size:  6-0   Wound skin closure material used: viacryl.   Suture technique:  Simple interrupted   Number of sutures:  3 Approximation:    Approximation:  Close Post-procedure details:    Dressing:  Open (no dressing) Comments:     This laceration was through the vermilion border.  The first stitch was placed to align the vermilion border, and then the mucous  membrane side and the external side were closed in turn. Marland Kitchen.Laceration Repair  Date/Time: 04/13/2020 5:16 AM Performed by: Rolland Porter, MD Authorized by: Rolland Porter, MD   Consent:    Consent obtained:  Verbal   Consent given by:  Patient Anesthesia (see MAR for exact dosages):    Anesthesia method:  Local infiltration   Local anesthetic:  Lidocaine 2% WITH epi Laceration details:    Location:  Lip   Lip location:  Lower exterior lip   Length (cm):  0.5   Laceration depth: Through the mucous membrane. Repair type:    Repair type:  Simple Pre-procedure details:    Preparation:  Patient was prepped and draped in usual sterile fashion and imaging obtained to evaluate for foreign bodies Exploration:    Hemostasis achieved with:  Direct pressure   Wound extent: no foreign bodies/material noted and no vascular damage noted     Contaminated: no   Treatment:    Area cleansed with:  Saline   Amount of cleaning:  Standard Skin repair:    Repair method:  Sutures   Suture size:  6-0   Wound skin closure material used: vicryl.   Number of sutures:  2 Approximation:    Approximation:  Close Post-procedure details:    Dressing:  Open (no dressing) Comments:     This laceration was on the mucous membrane side of the vermilion border and was vertically placed compared to the other laceration.  Both were consistent with lacerations from his teeth. Marland Kitchen.Laceration Repair  Date/Time: 04/13/2020 5:17 AM Performed by: Rolland Porter, MD Authorized by: Rolland Porter, MD   Consent:    Consent obtained:  Verbal   Consent given by:  Patient Anesthesia (see MAR for exact dosages):    Anesthesia method:  Topical application   Topical anesthetic:  LET Laceration details:    Location:  Face   Face location:  Nose   Length (cm):  6   Laceration depth: Through the dermis. Repair type:    Repair type:  Simple Pre-procedure details:    Preparation:  Imaging obtained to evaluate for foreign bodies and  patient was prepped and draped in usual sterile fashion Exploration:    Hemostasis achieved with:  Direct pressure   Wound extent: no foreign bodies/material noted     Contaminated: no   Treatment:    Area cleansed with:  Saline   Amount of cleaning:  Standard Skin repair:    Repair method:  Tissue adhesive Comments:     Patient had multiple lacerations   (including critical care time)  Medications Ordered in ED Medications  lidocaine HCl (PF) (XYLOCAINE) 2 % injection (  Canceled Entry 04/13/20 0244)  lidocaine-EPINEPHrine (XYLOCAINE W/EPI) 2 %-1:200000 (PF) injection (has no administration in time range)  acetaminophen (TYLENOL) tablet 650  mg (has no administration in time range)    Or  acetaminophen (TYLENOL) suppository 650 mg (has no administration in time range)  ondansetron (ZOFRAN) tablet 4 mg (has no administration in time range)    Or  ondansetron (ZOFRAN) injection 4 mg (has no administration in time range)  sodium chloride flush (NS) 0.9 % injection 3 mL (has no administration in time range)  sodium chloride 0.9 % bolus 500 mL (0 mLs Intravenous Stopped 04/13/20 0032)  lidocaine-EPINEPHrine-tetracaine (LET) topical gel (3 mLs Topical Given 04/13/20 0236)  lidocaine-EPINEPHrine (XYLOCAINE W/EPI) 2 %-1:200000 (PF) injection 10 mL (10 mLs Infiltration Given 04/13/20 0235)    ED Course  I have reviewed the triage vital signs and the nursing notes.  Pertinent labs & imaging results that were available during my care of the patient were reviewed by me and considered in my medical decision making (see chart for details).    MDM Rules/Calculators/A&P                         Patient's initial blood pressure is 68/57.  He was given 500 cc bolus of fluid.  Due to his underlying end-stage renal disease he will have fluid boluses given as needed.  11:25 PM patient's daughter is here.  She states patient did have dialysis today, Sunday in anticipation of dialysis center being  closed on Thursday for Thanksgiving.  She states he was taking a plate back to the kitchen when he fell in the living room.  She thinks he may have had some loss of consciousness for about 5 minutes.  Patient's blood pressure improved into the mid 80s after the 500 cc bolus.  He was given an additional 250 cc bolus after which his blood pressure improved until that mid to high 90s.  Patient states his blood pressures have been low lately especially since he started his dialysis.  1:55 AM the radiologist called his CT results.  Radiologist states patient has a hemorrhage in the right frontal lobe near the medial sulcus consistent with a contusion and less likely a very small subarachnoid bleed.  3:30 AM patient discussed with Dr. Olevia Bowens hospitalist.  He wants the neurosurgeon to have a an opinion on the chart about his CT head and then he will admit patient.  5:30 AM Dr. Trenton Gammon, neurosurgeon has looked at the scan.  He states to repeat his CT scan in 6 to 12 hours and if it is unchanged the patient could be discharged home as far as his CT findings.  He is going to put a note in the chart.  05:44 AM Dr Olevia Bowens, hospitalist, will admit   Final Clinical Impression(s) / ED Diagnoses Final diagnoses:  Fall in home, initial encounter  Laceration of vermilion border of lower lip, initial encounter  Laceration of frenum of upper lip, initial encounter  Nasal laceration, initial encounter  Hypotension due to hypovolemia  Syncope and collapse  Intracranial hematoma following injury, with loss of consciousness of 30 minutes or less, initial encounter [facility])    Rx / DC Orders  Plan admission  Rolland Porter, MD, Barbette Or, MD 04/13/20 [phone]

## 2020-04-12 NOTE — ED Triage Notes (Signed)
[facility] - pt had a unwitnessed fall tonight. Family found pt face down unconscious. Pt has laceration to right side of nose.

## 2020-04-13 ENCOUNTER — Other Ambulatory Visit: Payer: Self-pay

## 2020-04-13 ENCOUNTER — Observation Stay (HOSPITAL_COMMUNITY): Payer: Medicare HMO

## 2020-04-13 ENCOUNTER — Emergency Department (HOSPITAL_COMMUNITY): Payer: Medicare HMO

## 2020-04-13 ENCOUNTER — Encounter (HOSPITAL_COMMUNITY): Payer: Self-pay | Admitting: Internal Medicine

## 2020-04-13 ENCOUNTER — Other Ambulatory Visit (HOSPITAL_COMMUNITY): Payer: Medicare HMO

## 2020-04-13 ENCOUNTER — Other Ambulatory Visit (HOSPITAL_COMMUNITY): Payer: Self-pay | Admitting: *Deleted

## 2020-04-13 DIAGNOSIS — I10 Essential (primary) hypertension: Secondary | ICD-10-CM | POA: Diagnosis not present

## 2020-04-13 DIAGNOSIS — D539 Nutritional anemia, unspecified: Secondary | ICD-10-CM | POA: Diagnosis present

## 2020-04-13 DIAGNOSIS — R930 Abnormal findings on diagnostic imaging of skull and head, not elsewhere classified: Secondary | ICD-10-CM | POA: Diagnosis present

## 2020-04-13 DIAGNOSIS — Z043 Encounter for examination and observation following other accident: Secondary | ICD-10-CM | POA: Diagnosis not present

## 2020-04-13 DIAGNOSIS — I6389 Other cerebral infarction: Secondary | ICD-10-CM | POA: Diagnosis not present

## 2020-04-13 DIAGNOSIS — I517 Cardiomegaly: Secondary | ICD-10-CM | POA: Diagnosis not present

## 2020-04-13 DIAGNOSIS — R55 Syncope and collapse: Secondary | ICD-10-CM | POA: Diagnosis not present

## 2020-04-13 DIAGNOSIS — E785 Hyperlipidemia, unspecified: Secondary | ICD-10-CM | POA: Diagnosis not present

## 2020-04-13 DIAGNOSIS — M47812 Spondylosis without myelopathy or radiculopathy, cervical region: Secondary | ICD-10-CM | POA: Diagnosis not present

## 2020-04-13 DIAGNOSIS — J9811 Atelectasis: Secondary | ICD-10-CM | POA: Diagnosis not present

## 2020-04-13 DIAGNOSIS — J90 Pleural effusion, not elsewhere classified: Secondary | ICD-10-CM | POA: Diagnosis not present

## 2020-04-13 DIAGNOSIS — I6523 Occlusion and stenosis of bilateral carotid arteries: Secondary | ICD-10-CM | POA: Diagnosis not present

## 2020-04-13 DIAGNOSIS — S022XXA Fracture of nasal bones, initial encounter for closed fracture: Secondary | ICD-10-CM

## 2020-04-13 DIAGNOSIS — R079 Chest pain, unspecified: Secondary | ICD-10-CM | POA: Diagnosis not present

## 2020-04-13 DIAGNOSIS — S066X0A Traumatic subarachnoid hemorrhage without loss of consciousness, initial encounter: Secondary | ICD-10-CM | POA: Diagnosis not present

## 2020-04-13 DIAGNOSIS — J189 Pneumonia, unspecified organism: Secondary | ICD-10-CM | POA: Diagnosis not present

## 2020-04-13 DIAGNOSIS — G4733 Obstructive sleep apnea (adult) (pediatric): Secondary | ICD-10-CM

## 2020-04-13 DIAGNOSIS — Z515 Encounter for palliative care: Secondary | ICD-10-CM

## 2020-04-13 DIAGNOSIS — I4891 Unspecified atrial fibrillation: Secondary | ICD-10-CM | POA: Diagnosis not present

## 2020-04-13 DIAGNOSIS — N186 End stage renal disease: Secondary | ICD-10-CM

## 2020-04-13 DIAGNOSIS — I5022 Chronic systolic (congestive) heart failure: Secondary | ICD-10-CM | POA: Diagnosis present

## 2020-04-13 DIAGNOSIS — Z992 Dependence on renal dialysis: Secondary | ICD-10-CM

## 2020-04-13 LAB — GLUCOSE, CAPILLARY
Glucose-Capillary: 129 mg/dL — ABNORMAL HIGH (ref 70–99)
Glucose-Capillary: 91 mg/dL (ref 70–99)
Glucose-Capillary: 105 mg/dL — ABNORMAL HIGH (ref 70–99)

## 2020-04-13 LAB — CBG MONITORING, ED: Glucose-Capillary: 76 mg/dL (ref 70–99)

## 2020-04-13 LAB — TROPONIN I (HIGH SENSITIVITY): Troponin I (High Sensitivity): 26 ng/L — ABNORMAL HIGH (ref <18)

## 2020-04-13 LAB — RESP PANEL BY RT-PCR (FLU A&B, COVID) ARPGX2
Influenza A by PCR: NEGATIVE
Influenza B by PCR: NEGATIVE
SARS Coronavirus 2 by RT PCR: NEGATIVE

## 2020-04-13 MED ORDER — LORAZEPAM 1 MG PO TABS: 1.0000 mg | ORAL_TABLET | ORAL | Status: DC | PRN

## 2020-04-13 MED ORDER — ACETAMINOPHEN 325 MG PO TABS: 650.0000 mg | ORAL_TABLET | Freq: Four times a day (QID) | ORAL | Status: DC | PRN

## 2020-04-13 MED ORDER — THIAMINE HCL 100 MG PO TABS
100.0000 mg | ORAL_TABLET | Freq: Every day | ORAL | Status: DC
  Administered 2020-04-13 – 2020-04-14 (×2): 100 mg via ORAL
  Filled 2020-04-13 (×2): qty 1

## 2020-04-13 MED ORDER — FOLIC ACID 1 MG PO TABS
1.0000 mg | ORAL_TABLET | Freq: Every day | ORAL | Status: DC
  Administered 2020-04-13 – 2020-04-14 (×2): 1 mg via ORAL
  Filled 2020-04-13 (×2): qty 1

## 2020-04-13 MED ORDER — ONDANSETRON HCL 4 MG PO TABS: 4.0000 mg | ORAL_TABLET | Freq: Four times a day (QID) | ORAL | Status: DC | PRN

## 2020-04-13 MED ORDER — ONDANSETRON HCL 4 MG/2ML IJ SOLN: 4.0000 mg | Freq: Four times a day (QID) | INTRAMUSCULAR | Status: DC | PRN

## 2020-04-13 MED ORDER — THYROID 30 MG PO TABS
60.0000 mg | ORAL_TABLET | Freq: Every day | ORAL | Status: DC
  Administered 2020-04-14: 60 mg via ORAL
  Filled 2020-04-13: qty 2

## 2020-04-13 MED ORDER — ACETAMINOPHEN 650 MG RE SUPP: 650.0000 mg | Freq: Four times a day (QID) | RECTAL | Status: DC | PRN

## 2020-04-13 MED ORDER — LIDOCAINE-EPINEPHRINE (PF) 2 %-1:200000 IJ SOLN
INTRAMUSCULAR | Status: AC
  Filled 2020-04-13: qty 20

## 2020-04-13 MED ORDER — LIDOCAINE-EPINEPHRINE-TETRACAINE (LET) TOPICAL GEL
3.0000 mL | Freq: Once | TOPICAL | Status: AC
  Administered 2020-04-13: 3 mL via TOPICAL
  Filled 2020-04-13: qty 3

## 2020-04-13 MED ORDER — ADULT MULTIVITAMIN W/MINERALS CH
1.0000 | ORAL_TABLET | Freq: Every day | ORAL | Status: DC
  Administered 2020-04-13 – 2020-04-14 (×2): 1 via ORAL
  Filled 2020-04-13 (×2): qty 1

## 2020-04-13 MED ORDER — LIDOCAINE-EPINEPHRINE (PF) 2 %-1:200000 IJ SOLN
10.0000 mL | Freq: Once | INTRAMUSCULAR | Status: AC
  Administered 2020-04-13: 10 mL

## 2020-04-13 MED ORDER — SODIUM CHLORIDE 0.9% FLUSH
3.0000 mL | Freq: Two times a day (BID) | INTRAVENOUS | Status: DC
  Administered 2020-04-13 – 2020-04-14 (×3): 3 mL via INTRAVENOUS

## 2020-04-13 MED ORDER — LORAZEPAM 2 MG/ML IJ SOLN: 1.0000 mg | INTRAMUSCULAR | Status: DC | PRN

## 2020-04-13 MED ORDER — THIAMINE HCL 100 MG/ML IJ SOLN: 100.0000 mg | Freq: Every day | INTRAMUSCULAR | Status: DC

## 2020-04-13 MED ORDER — LIDOCAINE HCL (PF) 2 % IJ SOLN
INTRAMUSCULAR | Status: AC
  Filled 2020-04-13: qty 20

## 2020-04-13 NOTE — Evaluation (Signed)
Physical Therapy Evaluation Patient Details Name: Lawrence Bonilla MRN: [id number] DOB: 1950-08-22 Today's Date: 04/13/2020   History of Present Illness  Lawrence Bonilla is a 69 y.o. male with medical history significant of paroxysmal atrial fibrillation, chronic systolic heart failure, type II DM, ESRD on HD, hypertension, history of headaches, hyperlipidemia, hypothyroidism, lymphedema, OSA on CPAP, history of PE, PVD, all other nonhemorrhagic stroke, vitamin D deficiency who is coming to the emergency department after being found at home unconscious due to falling on his face.  The patient states that yesterday he took his blood pressure before HD and was lightheaded afterwards.  He received 2 L of IVF at the dialysis center, felt well and was able to drive home.  He remembers getting home and being on the kitchen, but does not remember what happened after this and the next thing he is able to recall is being awakened from unconsciousness.  He denies nausea, vomiting, blurred vision, focal weakness or numbness.  No chest pain, palpitations, diaphoresis, PND or orthopnea.  He denies abdominal pain, diarrhea, constipation, melena or hematochezia.  No dysuria, frequency or hematuria.    Clinical Impression  Patient demonstrates baseline performance consistent with PLOF and does not exhibit decline in functional independence or mobility at this time.  Patient able to safely perform bed mobility, transfers, and ambulation independently with increased time for tasks and position changes to ensure self-monitoring of symptoms. Patient demonstrates Good dynamic standing balance and is able to perform such tasks as picking up items from ground from a standing position without adverse effects of LOB.  No obvious activity tolerance limitations or need for use of AD noted at this time.  Patient discharged from physical therapy to care of nursing for ambulation daily as tolerated for length of stay.    Follow Up  Recommendations No PT follow up    Equipment Recommendations  None recommended by PT    Recommendations for Other Services       Precautions / Restrictions Precautions Precautions: None Precaution Comments: History of falls Restrictions Weight Bearing Restrictions: No      Mobility  Bed Mobility Overal bed mobility: Independent                  Transfers Overall transfer level: Independent Equipment used: None                Ambulation/Gait Ambulation/Gait assistance: Modified independent (Device/Increase time) Gait Distance (Feet): 200 Feet Assistive device: None Gait Pattern/deviations: Decreased stride length Gait velocity: reduced   General Gait Details: exhibits pathway deviation > 18 inches  Stairs            Wheelchair Mobility    Modified Rankin (Stroke Patients Only)       Balance Overall balance assessment: Independent                                           Pertinent Vitals/Pain Pain Assessment: No/denies pain    Home Living Family/patient expects to be discharged to:: Private residence Living Arrangements: Spouse/significant other;Children Available Help at Discharge: Family;Available 24 hours/day;Available PRN/intermittently Type of Home: House Home Access: Stairs to enter Entrance Stairs-Rails: None Entrance Stairs-Number of Steps: 3 Home Layout: One level Home Equipment: None      Prior Function Level of Independence: Independent         Comments: Independent in ADL and mobility.  Reports he keeps a cane in his car for occasional use     Hand Dominance        Extremity/Trunk Assessment   Upper Extremity Assessment Upper Extremity Assessment: Overall WFL for tasks assessed    Lower Extremity Assessment Lower Extremity Assessment: Overall WFL for tasks assessed    Cervical / Trunk Assessment Cervical / Trunk Assessment: Kyphotic (forward head)  Communication   Communication: No  difficulties  Cognition Arousal/Alertness: Awake/alert Behavior During Therapy: WFL for tasks assessed/performed Overall Cognitive Status: Within Functional Limits for tasks assessed                                        General Comments General comments (skin integrity, edema, etc.): able to bend over and pick up item from ground without support or UE support on AD/furniture, no LOB    Exercises     Assessment/Plan    PT Assessment Patent does not need any further PT services  PT Problem List         PT Treatment Interventions      PT Goals (Current goals can be found in the Care Plan section)  Acute Rehab PT Goals Patient Stated Goal: Return home PT Goal Formulation: With patient Time For Goal Achievement: 04/13/20 Potential to Achieve Goals: Good    Frequency     Barriers to discharge        Co-evaluation               AM-PAC PT "6 Clicks" Mobility  Outcome Measure Help needed turning from your back to your side while in a flat bed without using bedrails?: None Help needed moving from lying on your back to sitting on the side of a flat bed without using bedrails?: None Help needed moving to and from a bed to a chair (including a wheelchair)?: None Help needed standing up from a chair using your arms (e.g., wheelchair or bedside chair)?: None Help needed to walk in hospital room?: None Help needed climbing 3-5 steps with a railing? : None 6 Click Score: 24    End of Session   Activity Tolerance: Patient tolerated treatment well Patient left: in bed;with call bell/phone within reach Nurse Communication: Mobility status PT Visit Diagnosis: Unsteadiness on feet (R26.81);History of falling (Z91.81);Other abnormalities of gait and mobility (R26.89)    Time: 1355-1430 PT Time Calculation (min) (ACUTE ONLY): 35 min   Charges:   PT Evaluation $PT Eval Low Complexity: 1 Low PT Treatments $Gait Training: 8-22 mins       2:50 PM,  04/13/20 M. Sherlyn Lees, PT, DPT Physical Therapist- [place] Office Number: [phone]

## 2020-04-13 NOTE — Care Management Important Message (Deleted)
Important Message  Patient Details  Name: Lawrence Bonilla MRN: [id number] Date of Birth: 1950/09/25   Medicare Important Message Given:  Yes     [name] 04/13/2020, 3:35 PM

## 2020-04-13 NOTE — Progress Notes (Signed)
Pt sp fall with brief LOC.  No current neurological deficits on exam.  Minimal HA.  Pt with ESRD in Eliquis.  Head Ct with a very small area of cortical contusion in the right frontal lobe.  No fracture, infarct, edema or mass effect.    Very low risk of progression- even on anticoagulation.  Pt to be admitted for medical eval.  Recommend follow up head CT in 6 hrs.  Please call if scan shows worsening

## 2020-04-13 NOTE — Progress Notes (Signed)
Palliative: Thank you for this consult. Unfortunately due to high volume of consults there will be a delay in a Palliative Provider seeing this patient. Palliative Medicine will return to service on 04/14/2020 and will see patient at that time.  No charge Quinn Axe, NP Palliative Medicine Please call Palliative Medicine team phone with any questions [phone]. For individual providers please see [facility].

## 2020-04-13 NOTE — Progress Notes (Signed)
  Patient seen and evaluated, chart reviewed, please see EMR for updated orders. Please see full H&P dictated by admitting physician Dr. Olevia Bowens for same date of service.    Brief Summary:- [id number] with PMHx relevant for  ESRD [facility] HD on M/W/F schedule, h/o prior pulmonary embolism, PVD, history of ischemic stroke and PAFIb on Eliquis PTA,  chronic systolic heart failure, OSA on CPAP, HLD, DM2, hypothyroidism and ongoing daily alcohol use admitted on 04/13/2020 after being found unresponsive face down (fell at home) CT head showing possible ICH  A/p 1)Traumatic ICH/status post head injury while on Eliquis for anticoagulation-- -CT head without contrast shows-small volume subarachnoid hemorrhage along the medial high right frontal lobe. Possible subarachnoidhemorrhage along the inferior left temporal lobe and bilateral nasal bone fractures --Neurosurgical consult from Dr. Earnie Larsson appreciated  -Repeat CT head this a.m. without significant progression of previously documented ICH -Plan is for repeat CT head on 04/14/2020 and PT eval -Alcohol use (BAL 48)  and hypotension post hemodialysis may have contributed to fall and head injury  2) status post fall and possible syncope--- CT head as above #1, CT C-spine without acute findings, carotid artery Dopplers without hemodynamically significant stenosis, echo pending -Troponin peaked at 26  3) alcohol abuse--- patient admits to daily scotch use -Multivitamin as ordered, lorazepam per CIWA protocol  4)HFrEF--patient with chronic systolic dysfunction CHF -- --last known EF 25 to 20% based on echo from August 2020, repeat echo pending due to concerns for syncope --Hold isosorbide and beta-blockers due to hypotension  5)ESRD--patient had HD on 04/12/2020, plan is for repeat HD on 04/14/2020 -Nephrology input appreciated  6)OSA--CPAP use encouraged  7)DM2-recent A1c 5.1 reflecting excellent DM control PTA  --use Novolog/Humalog  Sliding scale insulin with Accu-Cheks/Fingersticks as ordered  8) history of prior stroke--hold Eliquis due to [place] as above #1, continue Lipitor  9) hypothyroidism--- restart Armour Thyroid 60 mg daily -Recent TSH was 4.5   Total care time over 46 minutes - Patient seen and evaluated, chart reviewed, please see EMR for updated orders. Please see full H&P dictated by admitting physician Dr. Olevia Bowens for same date of service.  Roxan Hockey, MD

## 2020-04-13 NOTE — Care Management Obs Status (Signed)
[place] NOTIFICATION   Patient Details  Name: Lawrence Bonilla MRN: [id number] Date of Birth: 11-Nov-1950   Medicare Observation Status Notification Given:       [name] 04/13/2020, 3:36 PM

## 2020-04-13 NOTE — Progress Notes (Signed)
[facility] Quick Note 43M ESRD [facility] yesterday having a fall, onto his face.  Evaluation identified nasal fracture and possible intracranial hemorrhage for which neurosurgery was asked to comment.  He is to undergo serial imaging and neurological exams with no immediate plan for operative intervention.  Other history includes chronic systolic heart failure, atrial fibrillation on apixaban, OSA on CPAP, history of PE, PVD, history of ischemic stroke.  Last HD was 11/21, on Thanksgiving schedule.  He left 0.6 L above his EDW.,  As usual, he had intradialytic hypotension.  HD prescription is 3.5 hours, 2K bath, EDW 70.5 kg, no heparin bolus with treatment.  Patient is asymptomatic, able to answer most questions but somewhat slow in thinking.  Patient currently receiving serial neurological exams.  Primary care is considering palliative measures.  We will tentatively plan for dialysis tomorrow, if remains an inpatient.  Have discussed with outpatient kidney center, they are aware no heparin during treatment.  Rexene Agent

## 2020-04-13 NOTE — ED Notes (Signed)
Lawrence Bonilla [phone] PT daughter.

## 2020-04-13 NOTE — H&P (Signed)
History and Physical    SALMAAN PATCHIN HWY:[id number] DOB: May 12, 1951 DOA: 04/12/2020  PCP: Ailene Ards, NP  Patient coming from: Home.  I have personally briefly reviewed patient's old medical records in [place]  Chief Complaint: Fall with LOC.  HPI: Lawrence Bonilla is a 69 y.o. male with medical history significant of paroxysmal atrial fibrillation, chronic systolic heart failure, type II DM, ESRD on HD, hypertension, history of headaches, hyperlipidemia, hypothyroidism, lymphedema, OSA on CPAP, history of PE, PVD, all other nonhemorrhagic stroke, vitamin D deficiency who is coming to the emergency department after being found at home unconscious due to falling on his face.  The patient states that yesterday he took his blood pressure before HD and was lightheaded afterwards.  He received 2 L of IVF at the dialysis center, felt well and was able to drive home.  He remembers getting home and being on the kitchen, but does not remember what happened after this and the next thing he is able to recall is being awakened from unconsciousness.  He denies nausea, vomiting, blurred vision, focal weakness or numbness.  No chest pain, palpitations, diaphoresis, PND or orthopnea.  He denies abdominal pain, diarrhea, constipation, melena or hematochezia.  No dysuria, frequency or hematuria.  ED Course: Initial vital signs were temperature 97.7 F, pulse 72, respirations 16, BP 72/47 mmHg O2 sat 98% on room air.  The patient received a 500 mL NS bolus in the ED and his wounds were sutured.  Labs: CBC showed a white count of 5.9, hemoglobin 10.2 g/dL and platelets 139.  Alcohol was 48 mg/dL.  Troponin was 15 and then 26 ng/L.  CMP shows sodium 134 and CO2 of 18 mmol/L with a normal anion gap.  All other electrolytes are normal when calcium is corrected to albumin.  Glucose 145, BUN 33, creatinine 3.45 mg/dL.  Total protein 6.4, albumin 2.9 g/dL.  AST, ALT, alk phosphatase and total bilirubin are within  normal range.  Imaging: CT head showed a focal area of low increase attenuation below the medial aspect of the right frontal lobe and involving the medial sulcus.  This likely represents a small surface petechial hemorrhage or small focal subarachnoid hemorrhage.  CT maxillofacial show mildly depressed comminuted anterior nasal bone fractures.  Soft tissue swelling over the nose and right supraorbital region.  CT C-spine showed chronic degenerative changes with normal alignment of the cervical spine.  There is no acute displaced fractures identified.  Please see images and full radiology report for further details.  Review of Systems: As per HPI otherwise all other systems reviewed and are negative.  Past Medical History:  Diagnosis Date  . Atrial fibrillation ([place])   . Congestive heart failure (CHF) ([place])   . DM type 2 (diabetes mellitus, type 2) ([place])   . ESRD on hemodialysis ([place])   . Essential hypertension   . Headache   . Hyperlipidemia   . Hypothyroidism   . Lymphedema   . OSA on CPAP   . PE (pulmonary thromboembolism) ([place])    High probability for PE on VQ scan 01/29/19  . PVD (peripheral vascular disease) ([place])   . Secondary cardiomyopathy ([place])   . Stroke ([place])   . Vitamin D deficiency disease   . Wears glasses    Past Surgical History:  Procedure Laterality Date  . APPENDECTOMY    . AV FISTULA PLACEMENT Left 11/04/2019   Procedure: left radiocephalic ARTERIOVENOUS (AV) FISTULA CREATION;  Surgeon: Rosetta Posner, MD;  Location: [facility] OR;  Service: Vascular;  Laterality: Left;  . BARIATRIC SURGERY    . CATARACT EXTRACTION W/ INTRAOCULAR LENS  IMPLANT, BILATERAL    . HERNIA REPAIR    . LIGATION OF COMPETING BRANCHES OF ARTERIOVENOUS FISTULA Left 02/06/2020   Procedure: LIGATION OF COMPETING BRANCHES OF LEFT ARM ARTERIOVENOUS FISTULA;  Surgeon: Rosetta Posner, MD;  Location: [facility]S;  Service: Vascular;  Laterality: Left;  . RIGHT HEART CATH N/A 05/02/2019   Procedure: RIGHT HEART  CATH;  Surgeon: Jolaine Artist, MD;  Location: [facility];  Service: Cardiovascular;  Laterality: N/A;  . TONSILLECTOMY    . WISDOM TOOTH EXTRACTION     Social History  reports that he has never smoked. He has never used smokeless tobacco. He reports current alcohol use. He reports that he does not use drugs.  No Known Allergies  Family History  Problem Relation Age of Onset  . Asthma Mother    Prior to Admission medications   Medication Sig Start Date End Date Taking? Authorizing Provider  apixaban (ELIQUIS) 5 MG TABS tablet Take 1 tablet (5 mg total) by mouth 2 (two) times daily. 11/26/19   Doree Albee, MD  atorvastatin (LIPITOR) 40 MG tablet Take 40 mg by mouth daily.    [provider]  AURYXIA 1 GM 210 MG(Fe) tablet Take 420 mg by mouth 2 (two) times daily.  11/21/19   [provider]  calcitRIOL (ROCALTROL) 0.25 MCG capsule Take 0.25 mcg by mouth daily.    [provider]  Calcium Carb-Cholecalciferol (CALCIUM 600 + D PO) Take 600 mg by mouth daily.     [provider]  fluticasone (FLONASE) 50 MCG/ACT nasal spray Place 1 spray into both nostrils daily. 02/19/20   Ailene Ards, NP  furosemide (LASIX) 40 MG tablet Take 2 tablets (80 mg total) by mouth 2 (two) times daily. 01/16/20   Doree Albee, MD  isosorbide mononitrate (IMDUR) 30 MG 24 hr tablet TAKE 1 TABLET(30 MG) BY MOUTH DAILY 03/26/20   Hurshel Party C, MD  metoprolol succinate (TOPROL-XL) 50 MG 24 hr tablet Take 1 tablet (50 mg total) by mouth daily. Take with or immediately following a meal. 03/26/20   [name], MD  potassium chloride (KLOR-CON) 10 MEQ tablet Take 1 tablet (10 mEq total) by mouth daily. 02/18/20   Doree Albee, MD  thyroid (ARMOUR THYROID) 60 MG tablet Take 60 mg by mouth daily before breakfast.    [provider]   Physical Exam: Vitals:   04/13/20 0240 04/13/20 0430 04/13/20 0515 04/13/20 0600  BP: 94/65 92/69 98/64 95/65   Pulse: 81 75 70 68  Resp: (!) _0 Temp:   97.9 F (36.6 C) 98 F (36.7 C)  TempSrc:   Oral Oral  SpO2: 100% 97% 99% 99%  Weight:      Height:       Constitutional: Looks chronically ill, but in NAD, calm, comfortable Eyes: PERRL, lids and conjunctivae normal ENMT: Positive edema, lacerations and ecchymosis of the nasal area.  Mucous membranes are moist. Posterior pharynx clear of any exudate or lesions. Neck: normal, supple, no masses, no thyromegaly.  Chest: Positive right side Shiley catheter. Respiratory: clear to auscultation bilaterally, no wheezing, no crackles. Normal respiratory effort. No accessory muscle use.  Cardiovascular: Regular rate and rhythm, no murmurs / rubs / gallops. No extremity edema. 2+ pedal pulses. No carotid bruits.  Left forearm AV fistula with  good thrill. Abdomen: Nondistended.  Bowel sounds positive.  Soft, no tenderness, no masses palpated. No hepatosplenomegaly. Musculoskeletal: no clubbing / cyanosis.  Good ROM, no contractures. Normal muscle tone.  Skin: no rashes, lesions, ulcers on limited dermatological examination. Neurologic: CN 2-12 grossly intact. Sensation intact, DTR normal. Strength 5/5 in all 4.  Psychiatric: Normal judgment and insight. Alert and oriented x 3. Normal mood.   Labs on Admission: I have personally reviewed following labs and imaging studies  CBC: Recent Labs  Lab 04/12/20 2315  WBC 5.9  NEUTROABS 4.9  HGB 10.2*  HCT 31.4*  MCV 110.2*  PLT 139*    Basic Metabolic Panel: Recent Labs  Lab 04/12/20 2315  NA 134*  K 4.4  CL 104  CO2 18*  GLUCOSE 145*  BUN 33*  CREATININE 3.45*  CALCIUM 7.9*    GFR: Estimated Creatinine Clearance: 20.9 mL/min (A) (by C-G formula based on SCr of 3.45 mg/dL (H)).  Liver Function Tests: Recent Labs  Lab 04/12/20 2315  AST 26  ALT 20  ALKPHOS 78  BILITOT 0.5  PROT 6.4*  ALBUMIN 2.9*    Urine analysis: No results found for: COLORURINE, APPEARANCEUR,  LABSPEC, PHURINE, GLUCOSEU, HGBUR, BILIRUBINUR, KETONESUR, PROTEINUR, UROBILINOGEN, NITRITE, LEUKOCYTESUR  Radiological Exams on Admission: CT Head Wo Contrast  Result Date: 04/13/2020 CLINICAL DATA:  Unwitnessed fall tonight. Patient was found face down and unconscious. Laceration to the right side of the nose. Facial trauma. EXAM: CT HEAD WITHOUT CONTRAST CT MAXILLOFACIAL WITHOUT CONTRAST CT CERVICAL SPINE WITHOUT CONTRAST TECHNIQUE: Multidetector CT imaging of the head, cervical spine, and maxillofacial structures were performed using the standard protocol without intravenous contrast. Multiplanar CT image reconstructions of the cervical spine and maxillofacial structures were also generated. COMPARISON:  CT head and cervical spine 01/28/2019 FINDINGS: CT HEAD FINDINGS Brain: Diffuse cerebral atrophy. Mild ventricular dilatation consistent with central atrophy. Low-attenuation changes throughout the deep white matter consistent with small vessel ischemia. No mass-effect or midline shift. No abnormal extra-axial fluid collections. The gray-white matter junctions are distinct. Basal cisterns are not effaced. Focal area of increased attenuation along the medial aspect of the right frontal lobe and involving a medial sulcus. This likely represents a small surface petechial hemorrhage or small focal subarachnoid hemorrhage. Vascular: Moderate intracranial arterial vascular calcifications. Skull: The calvarium appears intact. No acute depressed fractures are identified. Focal lucency in the left parietal region is unchanged since prior study, likely represents a venous [place]. Other: None. CT MAXILLOFACIAL FINDINGS Osseous: Mildly depressed comminuted anterior nasal bone fractures. Orbital and facial bones, mandibles, and temporomandibular joints are otherwise intact. Orbits: Globes and extraocular muscles appear intact and symmetrical. Sinuses: Paranasal sinuses and mastoid air cells are clear. Soft tissues: Soft  tissue swelling over the nose and extending to the right supraorbital region. Soft tissue swelling over the anterior mandible. CT CERVICAL SPINE FINDINGS Alignment: Normal alignment of the cervical vertebrae and facet joints. C1-2 articulation appears intact. Skull base and vertebrae: Skull base appears intact. No vertebral compression deformities. No focal bone lesion or bone destruction. Soft tissues and spinal canal: No prevertebral soft tissue swelling. No abnormal paraspinal soft tissue mass or infiltration. Disc levels: Degenerative changes with narrowed interspaces and endplate hypertrophic change, most prominent at C5-6 and C6-7 levels. Degenerative changes in the facet joints. Uncovertebral spurring causes neural foraminal encroachment at multiple levels bilaterally. Upper chest: Visualized lung apices are clear. Vascular calcifications. Other: None. IMPRESSION: CT HEAD: 1. Focal area of increased attenuation along the medial aspect of the  right frontal lobe and involving a medial sulcus. This likely represents a small surface petechial hemorrhage or small focal subarachnoid hemorrhage. 2. Chronic atrophy and small vessel ischemic changes. CT maxillofacial: Mildly depressed comminuted anterior nasal bone fractures. Soft tissue swelling over the nose and right supraorbital region. CT CERVICAL SPINE: Normal alignment of the cervical spine. Degenerative changes. No acute displaced fractures identified. Critical Value/emergent results were called by telephone at the time of interpretation on 04/13/2020 at 1:53 am to provider IVA KNAPP , who verbally acknowledged these results. Electronically Signed   By: Lucienne Capers M.D.   On: 04/13/2020 01:56   CT Cervical Spine Wo Contrast  Result Date: 04/13/2020 CLINICAL DATA:  Unwitnessed fall tonight. Patient was found face down and unconscious. Laceration to the right side of the nose. Facial trauma. EXAM: CT HEAD WITHOUT CONTRAST CT MAXILLOFACIAL WITHOUT  CONTRAST CT CERVICAL SPINE WITHOUT CONTRAST TECHNIQUE: Multidetector CT imaging of the head, cervical spine, and maxillofacial structures were performed using the standard protocol without intravenous contrast. Multiplanar CT image reconstructions of the cervical spine and maxillofacial structures were also generated. COMPARISON:  CT head and cervical spine 01/28/2019 FINDINGS: CT HEAD FINDINGS Brain: Diffuse cerebral atrophy. Mild ventricular dilatation consistent with central atrophy. Low-attenuation changes throughout the deep white matter consistent with small vessel ischemia. No mass-effect or midline shift. No abnormal extra-axial fluid collections. The gray-white matter junctions are distinct. Basal cisterns are not effaced. Focal area of increased attenuation along the medial aspect of the right frontal lobe and involving a medial sulcus. This likely represents a small surface petechial hemorrhage or small focal subarachnoid hemorrhage. Vascular: Moderate intracranial arterial vascular calcifications. Skull: The calvarium appears intact. No acute depressed fractures are identified. Focal lucency in the left parietal region is unchanged since prior study, likely represents a venous [place]. Other: None. CT MAXILLOFACIAL FINDINGS Osseous: Mildly depressed comminuted anterior nasal bone fractures. Orbital and facial bones, mandibles, and temporomandibular joints are otherwise intact. Orbits: Globes and extraocular muscles appear intact and symmetrical. Sinuses: Paranasal sinuses and mastoid air cells are clear. Soft tissues: Soft tissue swelling over the nose and extending to the right supraorbital region. Soft tissue swelling over the anterior mandible. CT CERVICAL SPINE FINDINGS Alignment: Normal alignment of the cervical vertebrae and facet joints. C1-2 articulation appears intact. Skull base and vertebrae: Skull base appears intact. No vertebral compression deformities. No focal bone lesion or bone  destruction. Soft tissues and spinal canal: No prevertebral soft tissue swelling. No abnormal paraspinal soft tissue mass or infiltration. Disc levels: Degenerative changes with narrowed interspaces and endplate hypertrophic change, most prominent at C5-6 and C6-7 levels. Degenerative changes in the facet joints. Uncovertebral spurring causes neural foraminal encroachment at multiple levels bilaterally. Upper chest: Visualized lung apices are clear. Vascular calcifications. Other: None. IMPRESSION: CT HEAD: 1. Focal area of increased attenuation along the medial aspect of the right frontal lobe and involving a medial sulcus. This likely represents a small surface petechial hemorrhage or small focal subarachnoid hemorrhage. 2. Chronic atrophy and small vessel ischemic changes. CT maxillofacial: Mildly depressed comminuted anterior nasal bone fractures. Soft tissue swelling over the nose and right supraorbital region. CT CERVICAL SPINE: Normal alignment of the cervical spine. Degenerative changes. No acute displaced fractures identified. Critical Value/emergent results were called by telephone at the time of interpretation on 04/13/2020 at 1:53 am to provider IVA KNAPP , who verbally acknowledged these results. Electronically Signed   By: Lucienne Capers M.D.   On: 04/13/2020 01:56   CT Maxillofacial  WO CM  Result Date: 04/13/2020 CLINICAL DATA:  Unwitnessed fall tonight. Patient was found face down and unconscious. Laceration to the right side of the nose. Facial trauma. EXAM: CT HEAD WITHOUT CONTRAST CT MAXILLOFACIAL WITHOUT CONTRAST CT CERVICAL SPINE WITHOUT CONTRAST TECHNIQUE: Multidetector CT imaging of the head, cervical spine, and maxillofacial structures were performed using the standard protocol without intravenous contrast. Multiplanar CT image reconstructions of the cervical spine and maxillofacial structures were also generated. COMPARISON:  CT head and cervical spine 01/28/2019 FINDINGS: CT HEAD  FINDINGS Brain: Diffuse cerebral atrophy. Mild ventricular dilatation consistent with central atrophy. Low-attenuation changes throughout the deep white matter consistent with small vessel ischemia. No mass-effect or midline shift. No abnormal extra-axial fluid collections. The gray-white matter junctions are distinct. Basal cisterns are not effaced. Focal area of increased attenuation along the medial aspect of the right frontal lobe and involving a medial sulcus. This likely represents a small surface petechial hemorrhage or small focal subarachnoid hemorrhage. Vascular: Moderate intracranial arterial vascular calcifications. Skull: The calvarium appears intact. No acute depressed fractures are identified. Focal lucency in the left parietal region is unchanged since prior study, likely represents a venous [place]. Other: None. CT MAXILLOFACIAL FINDINGS Osseous: Mildly depressed comminuted anterior nasal bone fractures. Orbital and facial bones, mandibles, and temporomandibular joints are otherwise intact. Orbits: Globes and extraocular muscles appear intact and symmetrical. Sinuses: Paranasal sinuses and mastoid air cells are clear. Soft tissues: Soft tissue swelling over the nose and extending to the right supraorbital region. Soft tissue swelling over the anterior mandible. CT CERVICAL SPINE FINDINGS Alignment: Normal alignment of the cervical vertebrae and facet joints. C1-2 articulation appears intact. Skull base and vertebrae: Skull base appears intact. No vertebral compression deformities. No focal bone lesion or bone destruction. Soft tissues and spinal canal: No prevertebral soft tissue swelling. No abnormal paraspinal soft tissue mass or infiltration. Disc levels: Degenerative changes with narrowed interspaces and endplate hypertrophic change, most prominent at C5-6 and C6-7 levels. Degenerative changes in the facet joints. Uncovertebral spurring causes neural foraminal encroachment at multiple levels  bilaterally. Upper chest: Visualized lung apices are clear. Vascular calcifications. Other: None. IMPRESSION: CT HEAD: 1. Focal area of increased attenuation along the medial aspect of the right frontal lobe and involving a medial sulcus. This likely represents a small surface petechial hemorrhage or small focal subarachnoid hemorrhage. 2. Chronic atrophy and small vessel ischemic changes. CT maxillofacial: Mildly depressed comminuted anterior nasal bone fractures. Soft tissue swelling over the nose and right supraorbital region. CT CERVICAL SPINE: Normal alignment of the cervical spine. Degenerative changes. No acute displaced fractures identified. Critical Value/emergent results were called by telephone at the time of interpretation on 04/13/2020 at 1:53 am to provider IVA KNAPP , who verbally acknowledged these results. Electronically Signed   By: Lucienne Capers M.D.   On: 04/13/2020 01:56   August, 2020 echocardiogram IMPRESSIONS:  1. The left ventricle has severely reduced systolic function, with an  ejection fraction of 25-30%. The cavity size was mildly dilated. Left  ventricular diastolic Doppler parameters are indeterminate.  2. The right ventricle has mildly reduced systolic function. The cavity  was moderately enlarged. There is no increase in right ventricular wall  thickness.  3. Left atrial size was severely dilated.  4. Right atrial size was severely dilated.  5. No evidence of mitral valve stenosis.  6. Tricuspid valve regurgitation is moderate.  7. The aortic valve is tricuspid. No stenosis of the aortic valve.  8. The aorta is normal unless  otherwise noted.  9. The aortic root is normal in size and structure.  10. Pulmonary hypertension is moderately elevated, PASP is 63 mmHg.   September 2020 carotid US  RIGHT CAROTID ARTERY: There is no grayscale evidence of significant intimal thickening or atherosclerotic plaque affecting the interrogated portions of the right  carotid system. There are no elevated peak systolic velocities within the interrogated course of the right internal carotid artery to suggest a hemodynamically significant stenosis.  RIGHT VERTEBRAL ARTERY:  Antegrade flow  LEFT CAROTID ARTERY: There is no grayscale evidence of significant intimal thickening or atherosclerotic plaque affecting the interrogated portions of the left carotid system. There are no elevated peak systolic velocities within the interrogated course of the left internal carotid artery to suggest a hemodynamically significant stenosis.  LEFT VERTEBRAL ARTERY:  Antegrade flow  IMPRESSION: Unremarkable carotid Doppler ultrasound.  EKG: Independently reviewed.   Assessment/Plan Principal Problem:   Abnormal CT of the head   (Possible intracranial bleed) Observation/telemetry. Frequent neuro checks. Hold anticoagulation. Recheck CT head later today. Contact neurosurgery if findings worsen.  Active Problems:   Syncope Likely due to hypotension. Fall precautions. Scheduled for echocardiogram next week. I will obtain the echocardiogram while he is in the hospital. He had a normal arterial carotid Doppler last year. Recheck arterial carotid Doppler    Nasal fracture Analgesics as needed. May have trouble wearing CPAP mask. Follow-up with maxillofacial surgery as an outpatient.    Chronic systolic CHF (congestive heart failure) (HCC) No signs of decompensation at this time. He is currently hypotensive. Holding furosemide, isosorbide mononitrate and metoprolol.    Hypothyroidism, adult Continue Armour Thyroid once med rec performed.    DM type 2 (diabetes mellitus, type 2) (HCC) Carbohydrate modified diet. CBG monitoring before meals and bedtime.    Essential hypertension Currently hypotensive. Hold beta-blocker, nitrates and diuretic. Monitor blood pressure.    Macrocytic anemia Monitor H&H. Continue Auryxia 420 mg p.o. twice  daily. Erythropoietin per nephrology.    OSA on CPAP Continue CPAP nightly if mask tolerated.    ESRD on hemodialysis ([place]) Calcitrol pending med rec. Hemodialysis orders per nephrology.    DVT prophylaxis: SCDs. Code Status:   Full code. Family Communication: Disposition Plan:   Patient is from:  Home.  Anticipated DC to:  Home.  Anticipated DC date:  04/13/2020 or 04/14/2020.  Anticipated DC barriers: Repeat head CT scan and clinical status.  Consults called: Admission status:  Observation/telemetry.  Severity of Illness:  Reubin Milan MD [facility]  How to contact the [facility] Attending or Consulting provider [place] or covering provider during after hours [place], for this patient?   1. Check the care team in [facility] and look for a) attending/consulting [facility] provider listed and b) the [facility] team listed 2. Log into [email or web address] and use [place]'s universal password to access. If you do not have the password, please contact the hospital operator. 3. Locate the [facility] provider you are looking for under [facility] and page to a number that you can be directly reached. 4. If you still have difficulty reaching the provider, please page the [facility] (Director on Call) for the Hospitalists listed on amion for assistance.  04/13/2020, 6:09 AM   This document was prepared using Dragon voice recognition software and may contain some unintended transcription errors.

## 2020-04-13 NOTE — Care Management Obs Status (Signed)
MEDICARE OBSERVATION STATUS NOTIFICATION   Patient Details  Name: Lawrence Bonilla MRN: [id number] Date of Birth: 09/22/1950   Medicare Observation Status Notification Given:  Yes (patient having procedure done, copy placed in room)    [name] 04/13/2020, 3:37 PM

## 2020-04-14 ENCOUNTER — Encounter (HOSPITAL_COMMUNITY): Payer: Self-pay | Admitting: Internal Medicine

## 2020-04-14 ENCOUNTER — Observation Stay (HOSPITAL_BASED_OUTPATIENT_CLINIC_OR_DEPARTMENT_OTHER): Payer: Medicare HMO

## 2020-04-14 ENCOUNTER — Observation Stay (HOSPITAL_COMMUNITY): Payer: Medicare HMO

## 2020-04-14 DIAGNOSIS — Z7189 Other specified counseling: Secondary | ICD-10-CM

## 2020-04-14 DIAGNOSIS — I609 Nontraumatic subarachnoid hemorrhage, unspecified: Secondary | ICD-10-CM | POA: Diagnosis not present

## 2020-04-14 DIAGNOSIS — I361 Nonrheumatic tricuspid (valve) insufficiency: Secondary | ICD-10-CM | POA: Diagnosis not present

## 2020-04-14 DIAGNOSIS — Z515 Encounter for palliative care: Secondary | ICD-10-CM | POA: Diagnosis not present

## 2020-04-14 DIAGNOSIS — N186 End stage renal disease: Secondary | ICD-10-CM | POA: Diagnosis not present

## 2020-04-14 DIAGNOSIS — S066X0A Traumatic subarachnoid hemorrhage without loss of consciousness, initial encounter: Secondary | ICD-10-CM | POA: Diagnosis not present

## 2020-04-14 DIAGNOSIS — I6389 Other cerebral infarction: Secondary | ICD-10-CM | POA: Diagnosis not present

## 2020-04-14 DIAGNOSIS — I132 Hypertensive heart and chronic kidney disease with heart failure and with stage 5 chronic kidney disease, or end stage renal disease: Secondary | ICD-10-CM | POA: Diagnosis not present

## 2020-04-14 DIAGNOSIS — R55 Syncope and collapse: Secondary | ICD-10-CM

## 2020-04-14 DIAGNOSIS — Z992 Dependence on renal dialysis: Secondary | ICD-10-CM | POA: Diagnosis not present

## 2020-04-14 DIAGNOSIS — I351 Nonrheumatic aortic (valve) insufficiency: Secondary | ICD-10-CM | POA: Diagnosis not present

## 2020-04-14 DIAGNOSIS — S022XXA Fracture of nasal bones, initial encounter for closed fracture: Secondary | ICD-10-CM | POA: Diagnosis not present

## 2020-04-14 DIAGNOSIS — R930 Abnormal findings on diagnostic imaging of skull and head, not elsewhere classified: Secondary | ICD-10-CM | POA: Diagnosis not present

## 2020-04-14 DIAGNOSIS — G319 Degenerative disease of nervous system, unspecified: Secondary | ICD-10-CM | POA: Diagnosis not present

## 2020-04-14 DIAGNOSIS — I5020 Unspecified systolic (congestive) heart failure: Secondary | ICD-10-CM | POA: Diagnosis not present

## 2020-04-14 DIAGNOSIS — D539 Nutritional anemia, unspecified: Secondary | ICD-10-CM | POA: Diagnosis not present

## 2020-04-14 LAB — BASIC METABOLIC PANEL
Anion gap: 11 (ref 5–15)
BUN: 53 mg/dL — ABNORMAL HIGH (ref 8–23)
CO2: 24 mmol/L (ref 22–32)
Calcium: 8.5 mg/dL — ABNORMAL LOW (ref 8.9–10.3)
Chloride: 101 mmol/L (ref 98–111)
Creatinine, Ser: 5.15 mg/dL — ABNORMAL HIGH (ref 0.61–1.24)
GFR, Estimated: 11 mL/min — ABNORMAL LOW (ref >60)
Glucose, Bld: 90 mg/dL (ref 70–99)
Potassium: 5 mmol/L (ref 3.5–5.1)
Sodium: 136 mmol/L (ref 135–145)

## 2020-04-14 LAB — ECHOCARDIOGRAM COMPLETE
Area-P 1/2: 2.99 cm2
Height: 72 in
P 1/2 time: 280 msec
S' Lateral: 3.97 cm
Weight: 2578.5 oz

## 2020-04-14 LAB — CBC
HCT: 32.2 % — ABNORMAL LOW (ref 39.0–52.0)
Hemoglobin: 10.2 g/dL — ABNORMAL LOW (ref 13.0–17.0)
MCH: 35.2 pg — ABNORMAL HIGH (ref 26.0–34.0)
MCHC: 31.7 g/dL (ref 30.0–36.0)
MCV: 111 fL — ABNORMAL HIGH (ref 80.0–100.0)
Platelets: 132 10*3/uL — ABNORMAL LOW (ref 150–400)
RBC: 2.9 MIL/uL — ABNORMAL LOW (ref 4.22–5.81)
RDW: 14.9 % (ref 11.5–15.5)
WBC: 6.5 10*3/uL (ref 4.0–10.5)
nRBC: 0 % (ref 0.0–0.2)

## 2020-04-14 LAB — HIV ANTIBODY (ROUTINE TESTING W REFLEX): HIV Screen 4th Generation wRfx: NONREACTIVE

## 2020-04-14 LAB — GLUCOSE, CAPILLARY
Glucose-Capillary: 75 mg/dL (ref 70–99)
Glucose-Capillary: 135 mg/dL — ABNORMAL HIGH (ref 70–99)

## 2020-04-14 MED ORDER — METOPROLOL TARTRATE 25 MG PO TABS: 12.5000 mg | ORAL_TABLET | Freq: Two times a day (BID) | ORAL | Status: DC

## 2020-04-14 MED ORDER — HEPARIN SODIUM (PORCINE) 1000 UNIT/ML IJ SOLN: 3800.0000 [IU] | Freq: Once | INTRAMUSCULAR | Status: DC

## 2020-04-14 MED ORDER — SODIUM CHLORIDE 0.9 % IV SOLN: 100.0000 mL | INTRAVENOUS | Status: DC | PRN

## 2020-04-14 MED ORDER — FOLIC ACID 1 MG PO TABS: 1.0000 mg | ORAL_TABLET | Freq: Every day | ORAL | 3 refills | Status: AC

## 2020-04-14 MED ORDER — ACETAMINOPHEN 325 MG PO TABS: 650.0000 mg | ORAL_TABLET | Freq: Four times a day (QID) | ORAL | 0 refills | Status: AC | PRN

## 2020-04-14 MED ORDER — MIDODRINE HCL 5 MG PO TABS: 5.0000 mg | ORAL_TABLET | Freq: Three times a day (TID) | ORAL | 1 refills | Status: AC

## 2020-04-14 MED ORDER — MIDODRINE HCL 5 MG PO TABS
5.0000 mg | ORAL_TABLET | Freq: Once | ORAL | Status: AC
  Administered 2020-04-14: 5 mg via ORAL
  Filled 2020-04-14: qty 1

## 2020-04-14 MED ORDER — ATORVASTATIN CALCIUM 40 MG PO TABS: 40.0000 mg | ORAL_TABLET | Freq: Every day | ORAL | 3 refills | Status: AC

## 2020-04-14 MED ORDER — CALCITRIOL 0.25 MCG PO CAPS
0.2500 ug | ORAL_CAPSULE | Freq: Every day | ORAL | Status: DC
  Administered 2020-04-14: 0.25 ug via ORAL
  Filled 2020-04-14: qty 1

## 2020-04-14 MED ORDER — ADULT MULTIVITAMIN W/MINERALS CH: 1.0000 | ORAL_TABLET | Freq: Every day | ORAL | 2 refills | Status: AC

## 2020-04-14 MED ORDER — METOPROLOL SUCCINATE ER 25 MG PO TB24: 25.0000 mg | ORAL_TABLET | Freq: Every day | ORAL | 5 refills | Status: AC

## 2020-04-14 MED ORDER — THIAMINE HCL 100 MG PO TABS: 100.0000 mg | ORAL_TABLET | Freq: Every day | ORAL | 2 refills | Status: AC

## 2020-04-14 MED ORDER — CALCIUM CARBONATE-VITAMIN D 500-200 MG-UNIT PO TABS
1.0000 | ORAL_TABLET | Freq: Two times a day (BID) | ORAL | Status: DC
  Administered 2020-04-14 (×2): 1 via ORAL
  Filled 2020-04-14 (×2): qty 1

## 2020-04-14 MED ORDER — ATORVASTATIN CALCIUM 40 MG PO TABS
40.0000 mg | ORAL_TABLET | Freq: Every day | ORAL | Status: DC
  Administered 2020-04-14: 40 mg via ORAL
  Filled 2020-04-14: qty 1

## 2020-04-14 NOTE — Procedures (Addendum)
   HEMODIALYSIS TREATMENT NOTE:   3.5 hour heparin-free HD completed via [facility] TDC.  Exit site is unremarkable.  AVF with +T/B but pt reports difficulty accessing it at outpatient center.    Goal met: 1 liter removed without interruption in UF.  Soft pressures, but hemodynamically stable.  All blood was returned.  Post-dialysis asymptomatic orthostatic hypotension (see VS flowsheet).  200cc NS bolus given.  Discussed with Dr. Joelyn Oms and Dr. Maurene Capes.    Still orthostatic 1 hour post dialysis.  Midodrine 79m was administered at 1700.  Bedside report given to NLowry RamRN.  Orthostatic VS due at 1800.  Pt resting in bed, call bell within reach.  ARockwell Alexandria Rn

## 2020-04-14 NOTE — Discharge Summary (Addendum)
Lawrence Bonilla, is a 69 y.o. male  DOB [birth year]  MRN [id number].  Admission date:  04/12/2020  Admitting Physician  Reubin Milan, MD  Discharge Date:  04/14/2020   Primary MD  Ailene Ards, NP  Recommendations for primary care physician for things to follow:   1)Your transthoracic echocardiogram TTE showed LInear calcified density in RA ? [facility] can consider TEE if  clinically indicated to further evaluate--- patient with HFpEF, A. fib, history of PE, admitted with syncope--- Needs  follow-up with cardiologist Dr. Domenic Polite to discuss risk versus benefits of possible TEE  2)Please hold apixaban/Eliquis until you are reevaluated by your primary care physician around Monday, 04/20/2020  3)Avoid ibuprofen/Advil/Aleve/Motrin/Goody Powders/Naproxen/BC powders/Meloxicam/Diclofenac/Indomethacin and other Nonsteroidal anti-inflammatory medications as these will make you more likely to bleed and can cause stomach ulcers, can also cause Kidney problems.   4) continue hemodialysis per usual schedule  5)Very low-salt diet advised  6)Weigh yourself daily, call if you gain more than 3 pounds in 1 day or more than 5 pounds in 1 week as your hemodialysis parameters and schedule may need to be adjusted  7)Limit your Fluid  intake to no more than 50 ounces (1.5 Liters) per day  8)Repeat CBC and BMP  Blood Test around Monday, 04/20/2020  9)Discontinue isosorbide/Imdur and hydralazine due to low blood pressure  10) please take midodrine 5 mg 3 times a day as prescribed for low blood pressure  Admission Diagnosis  Syncope and collapse [R55] Syncope [R55] Abnormal CT of the head [R93.0] Laceration of vermilion border of lower lip, initial encounter [S01.511A] Nasal laceration, initial encounter [B63.89HT] Fall in home, initial encounter [W19.XXXA, Y92.009] Laceration of frenum of upper lip, initial  encounter [S01.511A] Intracranial hematoma following injury, with loss of consciousness of 30 minutes or less, initial encounter ([place]) [S06.2X1A] Hypotension due to hypovolemia [I95.89, E86.1]   Discharge Diagnosis  Syncope and collapse [R55] Syncope [R55] Abnormal CT of the head [R93.0] Laceration of vermilion border of lower lip, initial encounter [S01.511A] Nasal laceration, initial encounter [D42.87GO] Fall in home, initial encounter [W19.XXXA, Y92.009] Laceration of frenum of upper lip, initial encounter [S01.511A] Intracranial hematoma following injury, with loss of consciousness of 30 minutes or less, initial encounter ([place]) [S06.2X1A] Hypotension due to hypovolemia [I95.89, E86.1]   Principal Problem:   Abnormal CT of the head Active Problems:   Hypothyroidism, adult   DM type 2 (diabetes mellitus, type 2) (HCC)   Essential hypertension   Macrocytic anemia   OSA on CPAP   ESRD on hemodialysis (HCC)   Chronic systolic CHF (congestive heart failure) (HCC)   Nasal fracture   Goals of care, counseling/discussion   Palliative care by specialist   DNR (do not resuscitate) discussion      Past Medical History:  Diagnosis Date  . Atrial fibrillation ([place])   . Chronic systolic CHF (congestive heart failure) ([place])   . Congestive heart failure (CHF) ([place])   . DM type 2 (diabetes mellitus, type 2) ([place])   . ESRD on  hemodialysis ([place])   . Essential hypertension   . Headache   . Hyperlipidemia   . Hypothyroidism   . Lymphedema   . OSA on CPAP   . PE (pulmonary thromboembolism) ([place])    High probability for PE on VQ scan 01/29/19  . PVD (peripheral vascular disease) ([place])   . Secondary cardiomyopathy ([place])   . Stroke ([place])   . Vitamin D deficiency disease   . Wears glasses     Past Surgical History:  Procedure Laterality Date  . APPENDECTOMY    . AV FISTULA PLACEMENT Left 11/04/2019   Procedure: left radiocephalic ARTERIOVENOUS (AV) FISTULA CREATION;  Surgeon: Rosetta Posner, MD;  Location: [facility];  Service: Vascular;  Laterality: Left;  . BARIATRIC SURGERY    . CATARACT EXTRACTION W/ INTRAOCULAR LENS  IMPLANT, BILATERAL    . HERNIA REPAIR    . LIGATION OF COMPETING BRANCHES OF ARTERIOVENOUS FISTULA Left 02/06/2020   Procedure: LIGATION OF COMPETING BRANCHES OF LEFT ARM ARTERIOVENOUS FISTULA;  Surgeon: Rosetta Posner, MD;  Location: [facility];  Service: Vascular;  Laterality: Left;  . RIGHT HEART CATH N/A 05/02/2019   Procedure: RIGHT HEART CATH;  Surgeon: Jolaine Artist, MD;  Location: [facility];  Service: Cardiovascular;  Laterality: N/A;  . TONSILLECTOMY    . WISDOM TOOTH EXTRACTION       HPI  from the history and physical done on the day of admission:     Chief Complaint: Fall with LOC.  HPI: Lawrence Bonilla is a 69 y.o. male with medical history significant of paroxysmal atrial fibrillation, chronic systolic heart failure, type II DM, ESRD on HD, hypertension, history of headaches, hyperlipidemia, hypothyroidism, lymphedema, OSA on CPAP, history of PE, PVD, all other nonhemorrhagic stroke, vitamin D deficiency who is coming to the emergency department after being found at home unconscious due to falling on his face.  The patient states that yesterday he took his blood pressure before HD and was lightheaded afterwards.  He received 2 L of IVF at the dialysis center, felt well and was able to drive home.  He remembers getting home and being on the kitchen, but does not remember what happened after this and the next thing he is able to recall is being awakened from unconsciousness.  He denies nausea, vomiting, blurred vision, focal weakness or numbness.  No chest pain, palpitations, diaphoresis, PND or orthopnea.  He denies abdominal pain, diarrhea, constipation, melena or hematochezia.  No dysuria, frequency or hematuria.  ED Course: Initial vital signs were temperature 97.7 F, pulse 72, respirations 16, BP 72/47 mmHg O2 sat 98% on room air.  The  patient received a 500 mL NS bolus in the ED and his wounds were sutured.  Labs: CBC showed a white count of 5.9, hemoglobin 10.2 g/dL and platelets 139.  Alcohol was 48 mg/dL.  Troponin was 15 and then 26 ng/L.  CMP shows sodium 134 and CO2 of 18 mmol/L with a normal anion gap.  All other electrolytes are normal when calcium is corrected to albumin.  Glucose 145, BUN 33, creatinine 3.45 mg/dL.  Total protein 6.4, albumin 2.9 g/dL.  AST, ALT, alk phosphatase and total bilirubin are within normal range.  Imaging: CT head showed a focal area of low increase attenuation below the medial aspect of the right frontal lobe and involving the medial sulcus.  This likely represents a small surface petechial hemorrhage or small focal subarachnoid hemorrhage.  CT maxillofacial show mildly depressed comminuted  anterior nasal bone fractures.  Soft tissue swelling over the nose and right supraorbital region.  CT C-spine showed chronic degenerative changes with normal alignment of the cervical spine.  There is no acute displaced fractures identified.  Please see images and full radiology report for further details.    Hospital Course:    Brief Summary:- [phone] with PMHx relevant [facility]  [facility], h/o prior pulmonary embolism, PVD, history of ischemic stroke and PAFIb on Eliquis PTA, chronic systolic heart failure, OSA on CPAP, HLD, DM2, hypothyroidism and ongoing daily alcohol use admitted on 04/13/2020 after being found unresponsive face down (fell at home) CT head showing  ICH  A/p 1)Traumatic ICH/status post head injury while on Eliquis for anticoagulation-- -CT head without contrast shows-small volume subarachnoid hemorrhage along the medial high right frontal lobe. Possible subarachnoidhemorrhage along the inferior left temporal lobe and bilateral nasal bone fractures --Neurosurgical consult from Dr. Earnie Larsson appreciated  -Repeat CT head on 04/13/20 without  significant progression of previously documented ICH - repeat CT head on 04/14/2020 with stable and resolving ICH without new bleed -Alcohol use (BAL 48)  and hypotension post hemodialysis may have contributed to fall and head injury  2)Status post fall and possible syncope--- CT head as above #1, CT C-spine without acute findings, carotid artery Dopplers without hemodynamically significant stenosis,  echo with EF of 55%, no regional wall motion normalities, patient with moderate TR, no significant aortic stenosis or mitral stenosis -Transthoracic echo showed LInear calcified density in RA ? [facility] --patient is advised to follow-up with his cardiologist Dr. Domenic Polite to discuss possible TEE for further evaluation -Troponin peaked at 26 -PT eval appreciated no further needs  3)Alcohol abuse--- patient admits to daily scotch use -Multivitamin as ordered, -Moderation of alcohol intake advised  4)HFrEF--patient with chronic systolic dysfunction CHF -- --last known EF 25 to 20% based on echo from August 2020, r -Please see echo report as noted above in #2  5)ESRD--patient had HD on 04/12/2020, patient had HD on 04/14/2020 -Nephrology input appreciated  6)OSA--CPAP use encouraged  7)DM2-recent A1c 5.1 reflecting excellent DM control PTA  - 8) history of prior stroke--hold Eliquis due to [place] as above #1, continue Lipitor  9) hypothyroidism--- restart Armour Thyroid 60 mg daily -Recent TSH was 4.5  Discharge Condition: Stable,  Follow UP   Follow-up Information    [name], MD. Schedule an appointment as soon as possible for a visit in 1 week(s).   Specialty: Cardiology Why: 1) TTE with LInear calcified density in RA ? [facility] can consider TEE if  clinically indicated to further evaluate--- patient with HFpEF, A. fib, history of PE, admitted with syncope--- Needs  follow-up --History of HFpEF and recent syncopal episode Contact information: [place] [address] [phone]               Diet and Activity recommendation:  As advised  Discharge Instructions    Discharge Instructions    Call MD for:  difficulty breathing, headache or visual disturbances   Complete by: As directed    Call MD for:  persistant dizziness or light-headedness   Complete by: As directed    Call MD for:  persistant nausea and vomiting   Complete by: As directed    Call MD for:  severe uncontrolled pain   Complete by: As directed    Call MD for:  temperature >100.4   Complete by: As directed  Diet - low sodium heart healthy   Complete by: As directed    Discharge instructions   Complete by: As directed    1)Your transthoracic echocardiogram TTE showed LInear calcified density in RA ? [facility] can consider TEE if  clinically indicated to further evaluate--- patient with HFpEF, A. fib, history of PE, admitted with syncope--- Needs  follow-up with cardiologist Dr. Domenic Polite to discuss risk versus benefits of possible TEE  2)Please hold apixaban/Eliquis until you are reevaluated by your primary care physician around Monday, 04/20/2020  3)Avoid ibuprofen/Advil/Aleve/Motrin/Goody Powders/Naproxen/BC powders/Meloxicam/Diclofenac/Indomethacin and other Nonsteroidal anti-inflammatory medications as these will make you more likely to bleed and can cause stomach ulcers, can also cause Kidney problems.   4) continue hemodialysis per usual schedule  5)Very low-salt diet advised  6)Weigh yourself daily, call if you gain more than 3 pounds in 1 day or more than 5 pounds in 1 week as your hemodialysis parameters and schedule may need to be adjusted  7)Limit your Fluid  intake to no more than 50 ounces (1.5 Liters) per day  8)Repeat CBC and BMP  Blood Test around Monday, 04/20/2020  9)Discontinue isosorbide/Imdur and hydralazine due to low blood pressure  10) please take midodrine 5 mg 3 times a day as prescribed for low blood  pressure   Discharge wound care:   Complete by: As directed    Keep nasal bridge wound clean and dry   Increase activity slowly   Complete by: As directed        Discharge Medications     Allergies as of 04/14/2020   No Known Allergies     Medication List    STOP taking these medications   apixaban 5 MG Tabs tablet Commonly known as: Eliquis   hydrALAZINE 10 MG tablet Commonly known as: APRESOLINE   isosorbide mononitrate 30 MG 24 hr tablet Commonly known as: IMDUR     TAKE these medications   acetaminophen 325 MG tablet Commonly known as: TYLENOL Take 2 tablets (650 mg total) by mouth every 6 (six) hours as needed for mild pain (or Fever >/= 101).   Armour Thyroid 60 MG tablet Generic drug: thyroid Take 60 mg by mouth daily before breakfast.   atorvastatin 40 MG tablet Commonly known as: LIPITOR Take 1 tablet (40 mg total) by mouth daily.   Auryxia 1 GM 210 MG(Fe) tablet Generic drug: ferric citrate Take 420 mg by mouth 2 (two) times daily.   calcitRIOL 0.25 MCG capsule Commonly known as: ROCALTROL Take 0.25 mcg by mouth daily.   CALCIUM 600 + D PO Take 600 mg by mouth daily.   calcium acetate 667 MG capsule Commonly known as: PHOSLO Take 667 mg by mouth 3 (three) times daily.   fluticasone 50 MCG/ACT nasal spray Commonly known as: FLONASE Place 1 spray into both nostrils daily.   folic acid 1 MG tablet Commonly known as: FOLVITE Take 1 tablet (1 mg total) by mouth daily. Start taking on: April 15, 2020   furosemide 40 MG tablet Commonly known as: LASIX Take 2 tablets (80 mg total) by mouth 2 (two) times daily.   metoprolol succinate 25 MG 24 hr tablet Commonly known as: TOPROL-XL Take 1 tablet (25 mg total) by mouth daily. Take with or immediately following a meal. What changed:   medication strength  how much to take   midodrine 5 MG tablet Commonly known as: PROAMATINE Take 1 tablet (5 mg total) by mouth 3 (three) times daily  with meals.  multivitamin with minerals Tabs tablet Take 1 tablet by mouth daily. Start taking on: April 15, 2020   potassium chloride 10 MEQ tablet Commonly known as: KLOR-CON Take 1 tablet (10 mEq total) by mouth daily.   thiamine 100 MG tablet Take 1 tablet (100 mg total) by mouth daily. Start taking on: April 15, 2020            Discharge Care Instructions  (From admission, onward)         Start     Ordered   04/14/20 0000  Discharge wound care:       Comments: Keep nasal bridge wound clean and dry   04/14/20 1405          Major procedures and Radiology Reports - PLEASE review detailed and final reports for all details, in brief -   X-ray chest PA and lateral  Result Date: 04/13/2020 CLINICAL DATA:  Pain following fall.  Atrial fibrillation EXAM: CHEST - 2 VIEW COMPARISON:  February 18, 2019 FINDINGS: Central catheter tip is in the right atrium. No pneumothorax. There is a right pleural effusion with atelectasis and questionable airspace opacity in the right base. The left lung is clear. Heart is enlarged with pulmonary vascularity normal. No adenopathy. There is aortic atherosclerosis. IMPRESSION: Central catheter tip in right atrium. No pneumothorax. Right pleural effusion with atelectasis and questionable superimposed pneumonia right base region. Left lung clear. Stable cardiomegaly. Aortic Atherosclerosis (ICD10-I70.0). Electronically Signed   By: Lowella Grip III M.D.   On: 04/13/2020 08:04   CT HEAD WO CONTRAST  Result Date: 04/14/2020 CLINICAL DATA:  Follow-up subarachnoid hemorrhage EXAM: CT HEAD WITHOUT CONTRAST TECHNIQUE: Contiguous axial images were obtained from the base of the skull through the vertex without intravenous contrast. COMPARISON:  Several CTs from 04/13/2020 FINDINGS: Brain: Previously seen subarachnoid hemorrhage along the inferior aspect of the left temporal lobe is again visualized and stable. The previously seen right-sided  subarachnoid hemorrhage along the frontal lobe is no longer identified. No new focal area of acute hemorrhage is seen. Chronic white matter ischemic changes and mild atrophic changes are noted. Findings of prior right occipital infarct are again noted and stable. Vascular: No hyperdense vessel or unexpected calcification. Skull: Normal. Negative for fracture or focal lesion. Sinuses/Orbits: No acute finding. Other: None. IMPRESSION: Resolution of previously seen high right frontal subarachnoid hemorrhage. Stable appearing subarachnoid hemorrhage in the inferior aspect of the left temporal lobe. No new focal abnormality is seen. Chronic atrophic and ischemic changes similar to that seen on the prior exam. Electronically Signed   By: Inez Catalina M.D.   On: 04/14/2020 10:10   CT HEAD WO CONTRAST  Result Date: 04/13/2020 CLINICAL DATA:  Trauma. Follow-up head CT after suspected hemorrhage. EXAM: CT HEAD WITHOUT CONTRAST TECHNIQUE: Contiguous axial images were obtained from the base of the skull through the vertex without intravenous contrast. COMPARISON:  Same day head CT. FINDINGS: Brain: No substantial change in small volume subarachnoid hemorrhage along the medial high right frontal lobe. Possible subarachnoid hemorrhage along the inferior left temporal lobes (series 4, image 39). No evidence of new hemorrhage. No evidence of acute large vascular territory infarct. Remote right occipital lobe infarct. Patchy white matter hypoattenuation, most consistent with chronic microvascular ischemic disease. Vascular: Calcific atherosclerosis. Skull: No acute fracture. Sinuses/Orbits: Bilateral nasal bone fractures, further characterized on same day maxillofacial CT. Overlying right paranasal/periorbital soft tissue contusion. Other: No mastoid effusions. IMPRESSION: 1. No substantial change in small volume subarachnoid hemorrhage along the medial  high right frontal lobe. Possible subarachnoid hemorrhage along the  inferior left temporal lobe, also similar to prior. No evidence of new or progressive hemorrhage. 2. Bilateral nasal bone fractures, further characterized on same day maxillofacial CT. 3. Remote right occipital lobe infarct and chronic microvascular ischemic disease. Electronically Signed   By: Margaretha Sheffield MD   On: 04/13/2020 08:09   CT Head Wo Contrast  Result Date: 04/13/2020 CLINICAL DATA:  Unwitnessed fall tonight. Patient was found face down and unconscious. Laceration to the right side of the nose. Facial trauma. EXAM: CT HEAD WITHOUT CONTRAST CT MAXILLOFACIAL WITHOUT CONTRAST CT CERVICAL SPINE WITHOUT CONTRAST TECHNIQUE: Multidetector CT imaging of the head, cervical spine, and maxillofacial structures were performed using the standard protocol without intravenous contrast. Multiplanar CT image reconstructions of the cervical spine and maxillofacial structures were also generated. COMPARISON:  CT head and cervical spine 01/28/2019 FINDINGS: CT HEAD FINDINGS Brain: Diffuse cerebral atrophy. Mild ventricular dilatation consistent with central atrophy. Low-attenuation changes throughout the deep white matter consistent with small vessel ischemia. No mass-effect or midline shift. No abnormal extra-axial fluid collections. The gray-white matter junctions are distinct. Basal cisterns are not effaced. Focal area of increased attenuation along the medial aspect of the right frontal lobe and involving a medial sulcus. This likely represents a small surface petechial hemorrhage or small focal subarachnoid hemorrhage. Vascular: Moderate intracranial arterial vascular calcifications. Skull: The calvarium appears intact. No acute depressed fractures are identified. Focal lucency in the left parietal region is unchanged since prior study, likely represents a venous [place]. Other: None. CT MAXILLOFACIAL FINDINGS Osseous: Mildly depressed comminuted anterior nasal bone fractures. Orbital and facial bones, mandibles,  and temporomandibular joints are otherwise intact. Orbits: Globes and extraocular muscles appear intact and symmetrical. Sinuses: Paranasal sinuses and mastoid air cells are clear. Soft tissues: Soft tissue swelling over the nose and extending to the right supraorbital region. Soft tissue swelling over the anterior mandible. CT CERVICAL SPINE FINDINGS Alignment: Normal alignment of the cervical vertebrae and facet joints. C1-2 articulation appears intact. Skull base and vertebrae: Skull base appears intact. No vertebral compression deformities. No focal bone lesion or bone destruction. Soft tissues and spinal canal: No prevertebral soft tissue swelling. No abnormal paraspinal soft tissue mass or infiltration. Disc levels: Degenerative changes with narrowed interspaces and endplate hypertrophic change, most prominent at C5-6 and C6-7 levels. Degenerative changes in the facet joints. Uncovertebral spurring causes neural foraminal encroachment at multiple levels bilaterally. Upper chest: Visualized lung apices are clear. Vascular calcifications. Other: None. IMPRESSION: CT HEAD: 1. Focal area of increased attenuation along the medial aspect of the right frontal lobe and involving a medial sulcus. This likely represents a small surface petechial hemorrhage or small focal subarachnoid hemorrhage. 2. Chronic atrophy and small vessel ischemic changes. CT maxillofacial: Mildly depressed comminuted anterior nasal bone fractures. Soft tissue swelling over the nose and right supraorbital region. CT CERVICAL SPINE: Normal alignment of the cervical spine. Degenerative changes. No acute displaced fractures identified. Critical Value/emergent results were called by telephone at the time of interpretation on 04/13/2020 at 1:53 am to provider IVA KNAPP , who verbally acknowledged these results. Electronically Signed   By: Lucienne Capers M.D.   On: 04/13/2020 01:56   CT Cervical Spine Wo Contrast  Result Date:  04/13/2020 CLINICAL DATA:  Unwitnessed fall tonight. Patient was found face down and unconscious. Laceration to the right side of the nose. Facial trauma. EXAM: CT HEAD WITHOUT CONTRAST CT MAXILLOFACIAL WITHOUT CONTRAST CT CERVICAL SPINE WITHOUT CONTRAST  TECHNIQUE: Multidetector CT imaging of the head, cervical spine, and maxillofacial structures were performed using the standard protocol without intravenous contrast. Multiplanar CT image reconstructions of the cervical spine and maxillofacial structures were also generated. COMPARISON:  CT head and cervical spine 01/28/2019 FINDINGS: CT HEAD FINDINGS Brain: Diffuse cerebral atrophy. Mild ventricular dilatation consistent with central atrophy. Low-attenuation changes throughout the deep white matter consistent with small vessel ischemia. No mass-effect or midline shift. No abnormal extra-axial fluid collections. The gray-white matter junctions are distinct. Basal cisterns are not effaced. Focal area of increased attenuation along the medial aspect of the right frontal lobe and involving a medial sulcus. This likely represents a small surface petechial hemorrhage or small focal subarachnoid hemorrhage. Vascular: Moderate intracranial arterial vascular calcifications. Skull: The calvarium appears intact. No acute depressed fractures are identified. Focal lucency in the left parietal region is unchanged since prior study, likely represents a venous [place]. Other: None. CT MAXILLOFACIAL FINDINGS Osseous: Mildly depressed comminuted anterior nasal bone fractures. Orbital and facial bones, mandibles, and temporomandibular joints are otherwise intact. Orbits: Globes and extraocular muscles appear intact and symmetrical. Sinuses: Paranasal sinuses and mastoid air cells are clear. Soft tissues: Soft tissue swelling over the nose and extending to the right supraorbital region. Soft tissue swelling over the anterior mandible. CT CERVICAL SPINE FINDINGS Alignment: Normal  alignment of the cervical vertebrae and facet joints. C1-2 articulation appears intact. Skull base and vertebrae: Skull base appears intact. No vertebral compression deformities. No focal bone lesion or bone destruction. Soft tissues and spinal canal: No prevertebral soft tissue swelling. No abnormal paraspinal soft tissue mass or infiltration. Disc levels: Degenerative changes with narrowed interspaces and endplate hypertrophic change, most prominent at C5-6 and C6-7 levels. Degenerative changes in the facet joints. Uncovertebral spurring causes neural foraminal encroachment at multiple levels bilaterally. Upper chest: Visualized lung apices are clear. Vascular calcifications. Other: None. IMPRESSION: CT HEAD: 1. Focal area of increased attenuation along the medial aspect of the right frontal lobe and involving a medial sulcus. This likely represents a small surface petechial hemorrhage or small focal subarachnoid hemorrhage. 2. Chronic atrophy and small vessel ischemic changes. CT maxillofacial: Mildly depressed comminuted anterior nasal bone fractures. Soft tissue swelling over the nose and right supraorbital region. CT CERVICAL SPINE: Normal alignment of the cervical spine. Degenerative changes. No acute displaced fractures identified. Critical Value/emergent results were called by telephone at the time of interpretation on 04/13/2020 at 1:53 am to provider IVA KNAPP , who verbally acknowledged these results. Electronically Signed   By: Lucienne Capers M.D.   On: 04/13/2020 01:56   US RENAL  Result Date: 03/20/2020 CLINICAL DATA:  Dialysis patient.  History of renal cyst. EXAM: RENAL / URINARY TRACT ULTRASOUND COMPLETE COMPARISON:  Renal ultrasound 09/06/2019. FINDINGS: Right Kidney: Renal measurements: 9.7 x 5.4 x 5.5 cm = volume: 149 mL. Cortical echogenicity is increased. Two cysts in the right kidney measuring 3.1 and 1.8 cm in diameter appear unchanged. Left Kidney: Renal measurements: 8.6 x 3.8 x 4.5  cm = volume: 77 mL. Cortical echogenicity is increased. 2 renal cysts are identified. The larger is off the upper pole measuring 4.3 cm. A second cyst in the midpole measures 2.5 cm. Bladder: Appears normal for degree of bladder distention. Other: None. IMPRESSION: No change in bilateral renal cysts. No new abnormality since the prior exam. Electronically Signed   By: Inge Rise M.D.   On: 03/20/2020 13:27   US Carotid Bilateral  Result Date: 04/13/2020 CLINICAL DATA:  Syncope,  hypertension, hyperlipidemia EXAM: BILATERAL CAROTID DUPLEX ULTRASOUND TECHNIQUE: Pearline Cables scale imaging, color Doppler and duplex ultrasound were performed of bilateral carotid and vertebral arteries in the neck. COMPARISON:  01/28/2019 FINDINGS: Criteria: Quantification of carotid stenosis is based on velocity parameters that correlate the residual internal carotid diameter with NASCET-based stenosis levels, using the diameter of the distal internal carotid lumen as the denominator for stenosis measurement. The following velocity measurements were obtained: RIGHT ICA: 125/44 cm/sec CCA: 52/84 cm/sec SYSTOLIC ICA/CCA RATIO:  2.4 ECA: 56 cm/sec LEFT ICA: 88/24 cm/sec CCA: 13/24 cm/sec SYSTOLIC ICA/CCA RATIO:  1.1 ECA: 51 cm/sec RIGHT CAROTID ARTERY: Minor echogenic shadowing plaque formation. No hemodynamically significant right ICA stenosis, velocity elevation, or turbulent flow. Degree of narrowing less than 50%. RIGHT VERTEBRAL ARTERY:  Normal antegrade flow LEFT CAROTID ARTERY: Similar scattered minor echogenic plaque formation. No hemodynamically significant left ICA stenosis, velocity elevation, or turbulent flow. LEFT VERTEBRAL ARTERY:  Normal antegrade flow IMPRESSION: Very minor carotid atherosclerosis. No hemodynamically significant ICA stenosis. Degree of narrowing less than 50% bilaterally by ultrasound criteria. Patent antegrade vertebral flow bilaterally Electronically Signed   By: Jerilynn Mages.  Shick M.D.   On: 04/13/2020 09:54    ECHOCARDIOGRAM COMPLETE  Result Date: 04/14/2020    ECHOCARDIOGRAM REPORT   Patient Name:   Lawrence Bonilla Date of Exam: 04/14/2020 Medical Rec #:  [id number]     Height:       72.0 in Accession #:    [id number]    Weight:       161.2 lb Date of Birth:  12-Feb-1951     BSA:          1.944 m Patient Age:    2 years      BP:           104/79 mmHg Patient Gender: M             HR:           85 bpm. Exam Location:  [facility] Procedure: 2D Echo Indications:    Syncope 780.2 / R55  History:        Patient has prior history of Echocardiogram examinations, most                 recent 01/10/2019. CHF, Stroke; Risk Factors:Diabetes,                 Hypertension and Non-Smoker. ESRD.  Sonographer:    Leavy Cella RDCS (AE) Referring Phys: [id number] DAVID MANUEL [place]  1. Left ventricular ejection fraction, by estimation, is 55%. The left ventricle has normal function. The left ventricle has no regional wall motion abnormalities. Left ventricular diastolic parameters are indeterminate.  2. Right ventricular systolic function is normal. The right ventricular size is normal.  3. Left atrial size was moderately dilated.  4. LInear calcified density in RA ? [facility] can consider TEE if clinically indicated to further evaluate. Right atrial size was moderately dilated.  5. The mitral valve is normal in structure. Trivial mitral valve regurgitation. No evidence of mitral stenosis.  6. Tricuspid valve regurgitation is moderate.  7. The aortic valve is tricuspid. Aortic valve regurgitation is mild. Mild to moderate aortic valve sclerosis/calcification is present, without any evidence of aortic stenosis.  8. The inferior vena cava is normal in size with greater than 50% respiratory variability, suggesting right atrial pressure of 3 mmHg. FINDINGS  Left Ventricle: Left ventricular ejection fraction, by estimation, is 55%. The left ventricle has normal function. The  left ventricle has no regional wall motion  abnormalities. The left ventricular internal cavity size was normal in size. There is no left ventricular hypertrophy. Left ventricular diastolic parameters are indeterminate. Right Ventricle: The right ventricular size is normal. No increase in right ventricular wall thickness. Right ventricular systolic function is normal. Left Atrium: Left atrial size was moderately dilated. Right Atrium: LInear calcified density in RA ? [facility] can consider TEE if clinically indicated to further evaluate. Right atrial size was moderately dilated. Prominent Eustachian valve. Pericardium: There is no evidence of pericardial effusion. Mitral Valve: The mitral valve is normal in structure. There is mild thickening of the mitral valve leaflet(s). There is mild calcification of the mitral valve leaflet(s). Mild mitral annular calcification. Trivial mitral valve regurgitation. No evidence  of mitral valve stenosis. Tricuspid Valve: The tricuspid valve is normal in structure. Tricuspid valve regurgitation is moderate . No evidence of tricuspid stenosis. Aortic Valve: The aortic valve is tricuspid. Aortic valve regurgitation is mild. Aortic regurgitation PHT measures 280 msec. Mild to moderate aortic valve sclerosis/calcification is present, without any evidence of aortic stenosis. Pulmonic Valve: The pulmonic valve was normal in structure. Pulmonic valve regurgitation is not visualized. No evidence of pulmonic stenosis. Aorta: The aortic root is normal in size and structure. Venous: The inferior vena cava is normal in size with greater than 50% respiratory variability, suggesting right atrial pressure of 3 mmHg. IAS/Shunts: No atrial level shunt detected by color flow Doppler.  LEFT VENTRICLE PLAX 2D LVIDd:         4.80 cm  Diastology LVIDs:         3.97 cm  LV e' medial:    7.83 cm/s LV PW:         1.72 cm  LV E/e' medial:  10.7 LV IVS:        1.17 cm  LV e' lateral:   8.16 cm/s LVOT diam:     2.10 cm  LV E/e' lateral: 10.2 LVOT  Area:     3.46 cm  RIGHT VENTRICLE RV S prime:     11.80 cm/s TAPSE (M-mode): 1.2 cm LEFT ATRIUM              Index       RIGHT ATRIUM           Index LA diam:        4.90 cm  2.52 cm/m  RA Area:     36.40 cm LA Vol (A2C):   186.0 ml 95.69 ml/m RA Volume:   149.00 ml 76.66 ml/m LA Vol (A4C):   147.0 ml 75.63 ml/m LA Biplane Vol: 167.0 ml 85.92 ml/m  AORTIC VALVE AI PHT:      280 msec  AORTA Ao Root diam: 3.40 cm MITRAL VALVE               TRICUSPID VALVE MV Area (PHT): 2.99 cm    TR Peak grad:   53.0 mmHg MV Decel Time: 254 msec    TR Vmax:        364.00 cm/s MV E velocity: 83.60 cm/s MV A velocity: 26.10 cm/s  SHUNTS MV E/A ratio:  3.20        Systemic Diam: 2.10 cm Jenkins Rouge MD Electronically signed by Jenkins Rouge MD Signature Date/Time: 04/14/2020/10:34:20 AM    Final    CT Maxillofacial WO CM  Result Date: 04/13/2020 CLINICAL DATA:  Unwitnessed fall tonight. Patient was found face down and unconscious. Laceration to the right  side of the nose. Facial trauma. EXAM: CT HEAD WITHOUT CONTRAST CT MAXILLOFACIAL WITHOUT CONTRAST CT CERVICAL SPINE WITHOUT CONTRAST TECHNIQUE: Multidetector CT imaging of the head, cervical spine, and maxillofacial structures were performed using the standard protocol without intravenous contrast. Multiplanar CT image reconstructions of the cervical spine and maxillofacial structures were also generated. COMPARISON:  CT head and cervical spine 01/28/2019 FINDINGS: CT HEAD FINDINGS Brain: Diffuse cerebral atrophy. Mild ventricular dilatation consistent with central atrophy. Low-attenuation changes throughout the deep white matter consistent with small vessel ischemia. No mass-effect or midline shift. No abnormal extra-axial fluid collections. The gray-white matter junctions are distinct. Basal cisterns are not effaced. Focal area of increased attenuation along the medial aspect of the right frontal lobe and involving a medial sulcus. This likely represents a small surface  petechial hemorrhage or small focal subarachnoid hemorrhage. Vascular: Moderate intracranial arterial vascular calcifications. Skull: The calvarium appears intact. No acute depressed fractures are identified. Focal lucency in the left parietal region is unchanged since prior study, likely represents a venous [place]. Other: None. CT MAXILLOFACIAL FINDINGS Osseous: Mildly depressed comminuted anterior nasal bone fractures. Orbital and facial bones, mandibles, and temporomandibular joints are otherwise intact. Orbits: Globes and extraocular muscles appear intact and symmetrical. Sinuses: Paranasal sinuses and mastoid air cells are clear. Soft tissues: Soft tissue swelling over the nose and extending to the right supraorbital region. Soft tissue swelling over the anterior mandible. CT CERVICAL SPINE FINDINGS Alignment: Normal alignment of the cervical vertebrae and facet joints. C1-2 articulation appears intact. Skull base and vertebrae: Skull base appears intact. No vertebral compression deformities. No focal bone lesion or bone destruction. Soft tissues and spinal canal: No prevertebral soft tissue swelling. No abnormal paraspinal soft tissue mass or infiltration. Disc levels: Degenerative changes with narrowed interspaces and endplate hypertrophic change, most prominent at C5-6 and C6-7 levels. Degenerative changes in the facet joints. Uncovertebral spurring causes neural foraminal encroachment at multiple levels bilaterally. Upper chest: Visualized lung apices are clear. Vascular calcifications. Other: None. IMPRESSION: CT HEAD: 1. Focal area of increased attenuation along the medial aspect of the right frontal lobe and involving a medial sulcus. This likely represents a small surface petechial hemorrhage or small focal subarachnoid hemorrhage. 2. Chronic atrophy and small vessel ischemic changes. CT maxillofacial: Mildly depressed comminuted anterior nasal bone fractures. Soft tissue swelling over the nose and right  supraorbital region. CT CERVICAL SPINE: Normal alignment of the cervical spine. Degenerative changes. No acute displaced fractures identified. Critical Value/emergent results were called by telephone at the time of interpretation on 04/13/2020 at 1:53 am to provider IVA KNAPP , who verbally acknowledged these results. Electronically Signed   By: Lucienne Capers M.D.   On: 04/13/2020 01:56   Micro Results    Recent Results (from the past 240 hour(s))  Resp Panel by RT-PCR (Flu A&B, Covid) Nasopharyngeal Swab     Status: None   Collection Time: 04/13/20  6:12 AM   Specimen: Nasopharyngeal Swab; Nasopharyngeal(NP) swabs in vial transport medium  Result Value Ref Range Status   SARS Coronavirus 2 by RT PCR NEGATIVE NEGATIVE Final    Comment: (NOTE) SARS-CoV-2 target nucleic acids are NOT DETECTED.  The SARS-CoV-2 RNA is generally detectable in upper respiratory specimens during the acute phase of infection. The lowest concentration of SARS-CoV-2 viral copies this assay can detect is 138 copies/mL. A negative result does not preclude SARS-Cov-2 infection and should not be used as the sole basis for treatment or other patient management decisions. A negative result may  occur with  improper specimen collection/handling, submission of specimen other than nasopharyngeal swab, presence of viral mutation(s) within the areas targeted by this assay, and inadequate number of viral copies(<138 copies/mL). A negative result must be combined with clinical observations, patient history, and epidemiological information. The expected result is Negative.  Fact Sheet for Patients:  EntrepreneurPulse.com.au  Fact Sheet for Healthcare Providers:  IncredibleEmployment.be  This test is no t yet approved or cleared by the [place] FDA and  has been authorized for detection and/or diagnosis of SARS-CoV-2 by FDA under an Emergency Use Authorization (EUA). This EUA will  remain  in effect (meaning this test can be used) for the duration of the COVID-19 declaration under Section 564(b)(1) of the Act, 21 U.S.C.section 360bbb-3(b)(1), unless the authorization is terminated  or revoked sooner.       Influenza A by PCR NEGATIVE NEGATIVE Final   Influenza B by PCR NEGATIVE NEGATIVE Final    Comment: (NOTE) The Xpert Xpress SARS-CoV-2/FLU/RSV plus assay is intended as an aid in the diagnosis of influenza from Nasopharyngeal swab specimens and should not be used as a sole basis for treatment. Nasal washings and aspirates are unacceptable for Xpert Xpress SARS-CoV-2/FLU/RSV testing.  Fact Sheet for Patients: EntrepreneurPulse.com.au  Fact Sheet for Healthcare Providers: IncredibleEmployment.be  This test is not yet approved or cleared by the [place] FDA and has been authorized for detection and/or diagnosis of SARS-CoV-2 by FDA under an Emergency Use Authorization (EUA). This EUA will remain in effect (meaning this test can be used) for the duration of the COVID-19 declaration under Section 564(b)(1) of the Act, 21 U.S.C. section 360bbb-3(b)(1), unless the authorization is terminated or revoked.  Performed at [facility], [address], [place], [place] [address]     Today   Subjective    Lawrence Bonilla today has no new complaints,       -ambulating without dizziness or shortness of breath or palpitations or chest pain --Tolerated hemodialysis  Patient has been seen and examined prior to discharge   Objective   Blood pressure 112/70, pulse 78, temperature 98 F (36.7 C), temperature source Oral, resp. rate 16, height 6' (1.829 m), weight 73.2 kg, SpO2 99 %.   Intake/Output Summary (Last 24 hours) at 04/14/2020 1637 Last data filed at 04/14/2020 1535 Gross per 24 hour  Intake 120 ml  Output 1350 ml  Net -1230 ml    Exam Gen:- Awake Alert, no acute distress  HEENT:- [place].AT, No sclera icterus,  nasal bridge abrasion is hemostatic Neck-Supple Neck,, right IJ hemodialysis catheter.  Lungs-diminished breath sounds, no wheezing CV- S1, S2 normal, regular Abd-  +ve B.Sounds, Abd Soft, No tenderness,   Extremity/Skin:- No  edema,   good pulses Psych-affect is appropriate, oriented x3 Neuro-no new focal deficits, no tremors    Data Review   CBC w Diff:  Lab Results  Component Value Date   WBC 6.5 04/14/2020   HGB 10.2 (L) 04/14/2020   HCT 32.2 (L) 04/14/2020   PLT 132 (L) 04/14/2020   LYMPHOPCT 8 04/12/2020   MONOPCT 8 04/12/2020   EOSPCT 0 04/12/2020   BASOPCT 0 04/12/2020    CMP:  Lab Results  Component Value Date   NA 136 04/14/2020   K 5.0 04/14/2020   CL 101 04/14/2020   CO2 24 04/14/2020   BUN 53 (H) 04/14/2020   CREATININE 5.15 (H) 04/14/2020   CREATININE 5.39 (H) 02/19/2020   PROT 6.4 (L) 04/12/2020   ALBUMIN 2.9 (L) 04/12/2020  BILITOT 0.5 04/12/2020   ALKPHOS 78 04/12/2020   AST 26 04/12/2020   ALT 20 04/12/2020  .   Total Discharge time is about 33 minutes  Roxan Hockey M.D on 04/14/2020 at 4:37 PM  Go to [email or web address] -  for contact info  [facility] - Office  [phone]

## 2020-04-14 NOTE — Discharge Instructions (Signed)
1)Your transthoracic echocardiogram TTE showed LInear calcified density in RA ? [facility] can consider TEE if  clinically indicated to further evaluate--- patient with HFpEF, A. fib, history of PE, admitted with syncope--- Needs  follow-up with cardiologist Dr. Domenic Polite to discuss risk versus benefits of possible TEE  2)Please hold apixaban/Eliquis until you are reevaluated by your primary care physician around Monday, 04/20/2020  3)Avoid ibuprofen/Advil/Aleve/Motrin/Goody Powders/Naproxen/BC powders/Meloxicam/Diclofenac/Indomethacin and other Nonsteroidal anti-inflammatory medications as these will make you more likely to bleed and can cause stomach ulcers, can also cause Kidney problems.   4) continue hemodialysis per usual schedule  5)Very low-salt diet advised  6)Weigh yourself daily, call if you gain more than 3 pounds in 1 day or more than 5 pounds in 1 week as your hemodialysis parameters and schedule may need to be adjusted  7)Limit your Fluid  intake to no more than 50 ounces (1.5 Liters) per day  8)Repeat CBC and BMP  Blood Test around Monday, 04/20/2020  9)Discontinue isosorbide/Imdur and hydralazine due to low blood pressure  10) please take midodrine 5 mg 3 times a day as prescribed for low blood pressure

## 2020-04-14 NOTE — Consult Note (Signed)
Lawrence Bonilla Admit Date: 04/12/2020 04/14/2020 Lawrence Bonilla Requesting Physician:  Lawrence Brick MD  Reason for Consult:  ESRD comanagement, Fall HPI:  64M ESRD [facility] admitted 2 days ago after having a fall at home following dialysis on Sunday.  He was found to have a nasal fracture and likely intracerebral hemorrhage.  Neurosurgery was consulted and advised serial exams and imaging.  Other history includes atrial fibrillation on apixaban, history of PE, OSA on CPAP, chronic systolic heart failure, PVD, history of ischemic stroke.  His alcohol level was positive at the time of presentation.  He receives HD on MWF schedule, but received it on Sunday 11/21 due to holiday arrangements; he is due for dialysis today.  HD prescription is 3.5 hours, 2K bath, EDW 70.5 kg, no heparin bolus with treatment.  This morning he has no complaints.  He is neurologically intact.  He has a left forearm AV fistula where cannulation is being attempted, he also has a right IJ tunneled catheter.  Repeat CT of the head yesterday morning with no change in the small subarachnoid hemorrhage along the right frontal lobe.  He denies dyspnea, angina, peripheral edema at the current time.  Palliative care is to see him today along with PT.  I/Os:  ROS Balance of 12 systems is negative w/ exceptions as above  PMH  Past Medical History:  Diagnosis Date   Atrial fibrillation (HCC)    Chronic systolic CHF (congestive heart failure) (HCC)    Congestive heart failure (CHF) (HCC)    DM type 2 (diabetes mellitus, type 2) (HCC)    ESRD on hemodialysis ([place])    Essential hypertension    Headache    Hyperlipidemia    Hypothyroidism    Lymphedema    OSA on CPAP    PE (pulmonary thromboembolism) (HCC)    High probability for PE on VQ scan 01/29/19   PVD (peripheral vascular disease) ([place])    Secondary cardiomyopathy ([place])    Stroke ([place])    Vitamin D deficiency disease    Wears glasses    PSH  Past  Surgical History:  Procedure Laterality Date   APPENDECTOMY     AV FISTULA PLACEMENT Left 11/04/2019   Procedure: left radiocephalic ARTERIOVENOUS (AV) FISTULA CREATION;  Surgeon: Rosetta Posner, MD;  Location: [facility] OR;  Service: Vascular;  Laterality: Left;   BARIATRIC SURGERY     CATARACT EXTRACTION W/ INTRAOCULAR LENS  IMPLANT, BILATERAL     HERNIA REPAIR     LIGATION OF COMPETING BRANCHES OF ARTERIOVENOUS FISTULA Left 02/06/2020   Procedure: LIGATION OF COMPETING BRANCHES OF LEFT ARM ARTERIOVENOUS FISTULA;  Surgeon: Rosetta Posner, MD;  Location: [facility];  Service: Vascular;  Laterality: Left;   RIGHT HEART CATH N/A 05/02/2019   Procedure: RIGHT HEART CATH;  Surgeon: Jolaine Artist, MD;  Location: [facility];  Service: Cardiovascular;  Laterality: N/A;   TONSILLECTOMY     WISDOM TOOTH EXTRACTION     FH  Family History  Problem Relation Age of Onset   Asthma Mother    [place]  reports that he has never smoked. He has never used smokeless tobacco. He reports current alcohol use. He reports that he does not use drugs. Allergies No Known Allergies Home medications Prior to Admission medications   Medication Sig Start Date End Date Taking? Authorizing Provider  apixaban (ELIQUIS) 5 MG TABS tablet Take 1 tablet (5 mg total) by mouth 2 (two) times daily. 11/26/19   Gosrani, Nimish  C, MD  atorvastatin (LIPITOR) 40 MG tablet Take 40 mg by mouth daily.    [provider]  AURYXIA 1 GM 210 MG(Fe) tablet Take 420 mg by mouth 2 (two) times daily.  11/21/19   [provider]  calcitRIOL (ROCALTROL) 0.25 MCG capsule Take 0.25 mcg by mouth daily.    [provider]  Calcium Carb-Cholecalciferol (CALCIUM 600 + D PO) Take 600 mg by mouth daily.     [provider]  fluticasone (FLONASE) 50 MCG/ACT nasal spray Place 1 spray into both nostrils daily. 02/19/20   Ailene Ards, NP  furosemide (LASIX) 40 MG tablet Take 2 tablets (80 mg total) by mouth 2 (two)  times daily. 01/16/20   Doree Albee, MD  isosorbide mononitrate (IMDUR) 30 MG 24 hr tablet TAKE 1 TABLET(30 MG) BY MOUTH DAILY 03/26/20   Hurshel Party C, MD  metoprolol succinate (TOPROL-XL) 50 MG 24 hr tablet Take 1 tablet (50 mg total) by mouth daily. Take with or immediately following a meal. 03/26/20   [name], MD  potassium chloride (KLOR-CON) 10 MEQ tablet Take 1 tablet (10 mEq total) by mouth daily. 02/18/20   Doree Albee, MD  thyroid (ARMOUR THYROID) 60 MG tablet Take 60 mg by mouth daily before breakfast.    [provider]    Current Medications Scheduled Meds:  atorvastatin  40 mg Oral Daily   calcitRIOL  0.25 mcg Oral Daily   calcium-vitamin D  1 tablet Oral BID WC   folic acid  1 mg Oral Daily   metoprolol tartrate  12.5 mg Oral BID   multivitamin with minerals  1 tablet Oral Daily   sodium chloride flush  3 mL Intravenous Q12H   thiamine  100 mg Oral Daily   Or   thiamine  100 mg Intravenous Daily   thyroid  60 mg Oral QAC breakfast   Continuous Infusions: PRN Meds:.acetaminophen **OR** acetaminophen, LORazepam **OR** LORazepam, ondansetron **OR** ondansetron (ZOFRAN) IV  CBC Recent Labs  Lab 04/12/20 2315 04/14/20 0428  WBC 5.9 6.5  NEUTROABS 4.9  --   HGB 10.2* 10.2*  HCT 31.4* 32.2*  MCV 110.2* 111.0*  PLT 139* 053*   Basic Metabolic Panel Recent Labs  Lab 04/12/20 2315 04/14/20 0428  NA 134* 136  K 4.4 5.0  CL 104 101  CO2 18* 24  GLUCOSE 145* 90  BUN 33* 53*  CREATININE 3.45* 5.15*  CALCIUM 7.9* 8.5*    Physical Exam  Blood pressure 104/79, pulse 85, temperature 97.8 F (36.6 C), resp. rate 19, height 6' (1.829 m), weight 73.1 kg, SpO2 98 %. GEN: Chronically ill-appearing ENT: Significant bruising and laceration across the bridge of the nose EYES: Periorbital ecchymosis present CV: Regular PULM: Clear bilaterally ABD: Soft SKIN: No rashes, left forearm with bruit and thrill across AV fistula EXT:  No significant edema  Assessment 15M ESRD MWF [facility] with fall, small ICH, and nasal fracture; neurologically stable  1. ESRD MWF [facility] with RIJ TDC and LFA AVF in process of cannuation 2. [place] after fall 11/21 3. Nasal Fracture 4. AFib on apixaban, held 5. Hx/o ischemic CVA 6. OSA on CPAP 7. HFrEF, compensated 8. ETOH user 9. 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 HD today on schedule: 2K, max 1L UF, use TDC, 3.5h, no heparin 2. PT, palliative care consults pending 3. Will follow along   Lawrence Bonilla  04/14/2020, 8:30 AM

## 2020-04-14 NOTE — Consult Note (Signed)
Consultation Note Date: 04/14/2020   Patient Name: Lawrence Bonilla  DOB: 15-Dec-1950  MRN: [id number]  Age / Sex: 69 y.o., male  PCP: Ailene Ards, NP Referring Physician: Roxan Hockey, MD  Reason for Consultation: Establishing goals of care and Psychosocial/spiritual support  HPI/Patient Profile: 69 y.o. male  with past medical history of A fib, chronic sHF, DM2, ESRD on HD, HTN/HLD, headaches, hypothyroidism, lymphedema, OSA/ CPAP, history of PE, PVD, nonhemorrhagic stroke, vitamin D deficiency  admitted on 04/12/2020 with fall with brain bleed.   Clinical Assessment and Goals of Care: I have reviewed medical records including EPIC notes, labs and imaging, received report from transition of care team, examined the patient and met at bedside with Lawrence Bonilla to discuss diagnosis prognosis, [place], EOL wishes, disposition and options.   Lawrence Bonilla is lying quietly in bed.  He appears acutely/chronically ill and quite frail.  He is alert and oriented x3, able to make his needs known.  There is no family at bedside at this time. I introduced Palliative Medicine as specialized medical care for people living with serious illness. It focuses on providing relief from the symptoms and stress of a serious illness.   We discussed a brief life review of the patient.  Lawrence Bonilla is married.  His wife is in her early 58s and her health is "fine".  His daughter Sunday Spillers and her husband live in their home.  He has a son who lives in [place].  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  As far as functional and nutritional status, Lawrence Bonilla endorses decline since his start on dialysis approximately 5 months ago.  He shares his difficulty in finding energy to do what he would like to do.  We discussed current illness and what it means in the larger context of on-going co-morbidities.  Natural disease trajectory and  expectations at EOL were discussed.  We talked about the chronic illness pathway, what is normal and expected.  I encourage Lawrence Bonilla to work with Education officer, museum at HD center for any needs.  Advanced directives, concepts specific to code status, were considered and discussed.  We talked about the concept of "treat the treatable but allowing natural passing".  I encourage Lawrence Bonilla to consider what is important to hearing.  Questions and concerns were addressed.    Conference with attending, bedside nursing staff, transition of care team, HD nursing staff related to patient condition, needs, goals of care.  HCPOA    NEXT OF KIN -   Lawrence Bonilla names his wife, Daylan Juhnke, as his healthcare surrogate.  His daughter, Sunday Spillers, and her husband live in their home.  He has a son who lives in [place].    SUMMARY OF RECOMMENDATIONS   At this point continue full scope/full code Continue dialysis Continue CODE STATUS discussions   Code Status/Advance Care Planning:  Full code -we talked about "treat the treatable but allow a natural passing".  We talked about setting limits on time for life support.  Symptom Management:   Per hospitalist, no additional needs at this time.  Palliative Prophylaxis:   Frequent Pain Assessment and Turn Reposition  Additional Recommendations (Limitations, Scope, Preferences):  Full Scope Treatment  Psycho-social/Spiritual:   Desire for further Chaplaincy support:no  Additional Recommendations: Caregiving  Support/Resources and Grief/Bereavement Support  Prognosis:   Unable to determine, based on outcomes.  6 months to 1 year would not be surprising based on chronic illness burden, age.  Discharge Planning:  Home with no needs at this time      Primary Diagnoses: Present on Admission: . Abnormal CT of the head . Hypothyroidism, adult . Essential hypertension . Macrocytic anemia . Chronic systolic CHF (congestive heart failure)  ([place])   I have reviewed the medical record, interviewed the patient and family, and examined the patient. The following aspects are pertinent.  Past Medical History:  Diagnosis Date  . Atrial fibrillation ([place])   . Chronic systolic CHF (congestive heart failure) ([place])   . Congestive heart failure (CHF) ([place])   . DM type 2 (diabetes mellitus, type 2) ([place])   . ESRD on hemodialysis ([place])   . Essential hypertension   . Headache   . Hyperlipidemia   . Hypothyroidism   . Lymphedema   . OSA on CPAP   . PE (pulmonary thromboembolism) ([place])    High probability for PE on VQ scan 01/29/19  . PVD (peripheral vascular disease) ([place])   . Secondary cardiomyopathy ([place])   . Stroke ([place])   . Vitamin D deficiency disease   . Wears glasses    Social History   Socioeconomic History  . Marital status: Married    Spouse name: Not on file  . Number of children: Not on file  . Years of education: Not on file  . Highest education level: Not on file  Occupational History  . Not on file  Tobacco Use  . Smoking status: Never Smoker  . Smokeless tobacco: Never Used  Vaping Use  . Vaping Use: Never used  Substance and Sexual Activity  . Alcohol use: Yes    Comment: 2 SCOTCH   . Drug use: Never  . Sexual activity: Yes    Partners: Female    Comment: MARRIED FOR 40 YEARS   Other Topics Concern  . 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   Social Determinants of Health   Financial Resource Strain:   . Difficulty of Paying Living Expenses: Not on file  Food Insecurity:   . Worried About Charity fundraiser in the Last Year: Not on file  . Ran Out of Food in the Last Year: Not on file  Transportation Needs:   . Lack of Transportation (Medical): Not on file  . Lack of Transportation (Non-Medical): Not on file  Physical Activity:   . Days of Exercise per Week: Not on file  . Minutes of  Exercise per Session: Not on file  Stress:   . Feeling of Stress : Not on file  Social Connections:   . Frequency of Communication with Friends and Family: Not on file  . Frequency of Social Gatherings with Friends and Family: Not on file  . Attends Religious Services: Not on file  . Active Member of Clubs or Organizations: Not on file  . Attends Archivist Meetings: Not on file  . Marital Status: Not on file   Family History  Problem Relation Age  of Onset  . Asthma Mother    Scheduled Meds: . atorvastatin  40 mg Oral Daily  . calcitRIOL  0.25 mcg Oral Daily  . calcium-vitamin D  1 tablet Oral BID WC  . folic acid  1 mg Oral Daily  . metoprolol tartrate  12.5 mg Oral BID  . multivitamin with minerals  1 tablet Oral Daily  . sodium chloride flush  3 mL Intravenous Q12H  . thiamine  100 mg Oral Daily   Or  . thiamine  100 mg Intravenous Daily  . thyroid  60 mg Oral QAC breakfast   Continuous Infusions: PRN Meds:.acetaminophen **OR** acetaminophen, LORazepam **OR** LORazepam, ondansetron **OR** ondansetron (ZOFRAN) IV Medications Prior to Admission:  Prior to Admission medications   Medication Sig Start Date End Date Taking? Authorizing Provider  apixaban (ELIQUIS) 5 MG TABS tablet Take 1 tablet (5 mg total) by mouth 2 (two) times daily. 11/26/19   Doree Albee, MD  atorvastatin (LIPITOR) 40 MG tablet Take 40 mg by mouth daily.    [provider]  AURYXIA 1 GM 210 MG(Fe) tablet Take 420 mg by mouth 2 (two) times daily.  11/21/19   [provider]  calcitRIOL (ROCALTROL) 0.25 MCG capsule Take 0.25 mcg by mouth daily.    [provider]  Calcium Carb-Cholecalciferol (CALCIUM 600 + D PO) Take 600 mg by mouth daily.     [provider]  fluticasone (FLONASE) 50 MCG/ACT nasal spray Place 1 spray into both nostrils daily. 02/19/20   Ailene Ards, NP  furosemide (LASIX) 40 MG tablet Take 2 tablets (80 mg total) by mouth 2 (two) times daily.  01/16/20   Doree Albee, MD  isosorbide mononitrate (IMDUR) 30 MG 24 hr tablet TAKE 1 TABLET(30 MG) BY MOUTH DAILY 03/26/20   Hurshel Party C, MD  metoprolol succinate (TOPROL-XL) 50 MG 24 hr tablet Take 1 tablet (50 mg total) by mouth daily. Take with or immediately following a meal. 03/26/20   [name], MD  potassium chloride (KLOR-CON) 10 MEQ tablet Take 1 tablet (10 mEq total) by mouth daily. 02/18/20   Doree Albee, MD  thyroid (ARMOUR THYROID) 60 MG tablet Take 60 mg by mouth daily before breakfast.    [provider]   No Known Allergies Review of Systems  Unable to perform ROS: Other    Physical Exam Vitals and nursing note reviewed.  Constitutional:      General: He is not in acute distress.    Appearance: Normal appearance. He is ill-appearing.  HENT:     Head: Normocephalic.     Ears:     Comments: Bruising on face     Mouth/Throat:     Mouth: Mucous membranes are moist.  Cardiovascular:     Rate and Rhythm: Normal rate.  Pulmonary:     Effort: Pulmonary effort is normal. No respiratory distress.  Abdominal:     General: Abdomen is flat.     Palpations: Abdomen is soft.  Skin:    General: Skin is warm and dry.  Neurological:     Mental Status: He is alert and oriented to person, place, and time.  Psychiatric:        Mood and Affect: Mood normal.        Behavior: Behavior normal.     Vital Signs: BP 104/79 (BP Location: Right Arm)   Pulse 85   Temp 97.8 F (36.6 C)   Resp 19   Ht 6' (1.829  m)   Wt 73.1 kg   SpO2 98%   BMI 21.86 kg/m  Pain Scale: 0-10   Pain Score: 0-No pain   SpO2: SpO2: 98 % O2 Device:SpO2: 98 % O2 Flow Rate: .   IO: Intake/output summary:   Intake/Output Summary (Last 24 hours) at 04/14/2020 8502 Last data filed at 04/13/2020 1900 Gross per 24 hour  Intake 120 ml  Output 350 ml  Net -230 ml    LBM:   Baseline Weight: Weight: 73 kg Most recent weight: Weight: 73.1 kg     Palliative  Assessment/Data:   Flowsheet Rows     Most Recent Value  Intake Tab  Referral Department Hospitalist  Unit at Time of Referral Cardiac/Telemetry Unit  Palliative Care Primary Diagnosis Trauma  Date Notified 04/13/20  Palliative Care Type New Palliative care  Reason for referral Clarify Goals of Care  Date of Admission 04/12/20  Date first seen by Palliative Care 04/14/20  # of days Palliative referral response time 1 Day(s)  # of days IP prior to Palliative referral 1  Clinical Assessment  Palliative Performance Scale Score 60%  Pain Max last 24 hours Not able to report  Pain Min Last 24 hours Not able to report  Dyspnea Max Last 24 Hours Not able to report  Dyspnea Min Last 24 hours Not able to report  Psychosocial & Spiritual Assessment  Palliative Care Outcomes      Time In: 0950 Time Out: 1100 Time Total: 70 minutes  Greater than 50%  of this time was spent counseling and coordinating care related to the above assessment and plan.  Signed by: Drue Novel, NP   Please contact Palliative Medicine Team phone at [phone] for questions and concerns.  For individual provider: See Shea Evans

## 2020-04-14 NOTE — Progress Notes (Signed)
Vitals rechecked an hour after first dose of midodrine and sitting and standing bp still low.  Patient ambulated to bathroom and denies feeling dizzy.  Dr. Johnette Abraham. Courage ok to follow through with DC.  Given medication hand out and will pick up prescriptions on way home and understands to continue midodrine with each meal and to stop bp meds except metoprolol.  Waiting on ride home

## 2020-04-14 NOTE — Progress Notes (Signed)
*  PRELIMINARY RESULTS* Echocardiogram 2D Echocardiogram has been performed.  Leavy Cella 04/14/2020, 10:16 AM

## 2020-04-15 ENCOUNTER — Telehealth (INDEPENDENT_AMBULATORY_CARE_PROVIDER_SITE_OTHER): Payer: Self-pay | Admitting: Nurse Practitioner

## 2020-04-15 NOTE — Telephone Encounter (Signed)
Please call this patient and see if we can get him scheduled for post-hospital follow-up sometime this week (11/29-12/2). Thank you.

## 2020-04-17 ENCOUNTER — Other Ambulatory Visit (INDEPENDENT_AMBULATORY_CARE_PROVIDER_SITE_OTHER): Payer: Self-pay | Admitting: Internal Medicine

## 2020-04-17 DIAGNOSIS — N2581 Secondary hyperparathyroidism of renal origin: Secondary | ICD-10-CM | POA: Diagnosis not present

## 2020-04-17 DIAGNOSIS — Z992 Dependence on renal dialysis: Secondary | ICD-10-CM | POA: Diagnosis not present

## 2020-04-17 DIAGNOSIS — N186 End stage renal disease: Secondary | ICD-10-CM | POA: Diagnosis not present

## 2020-04-20 DIAGNOSIS — N2581 Secondary hyperparathyroidism of renal origin: Secondary | ICD-10-CM | POA: Diagnosis not present

## 2020-04-20 DIAGNOSIS — N186 End stage renal disease: Secondary | ICD-10-CM | POA: Diagnosis not present

## 2020-04-20 DIAGNOSIS — G4733 Obstructive sleep apnea (adult) (pediatric): Secondary | ICD-10-CM | POA: Diagnosis not present

## 2020-04-20 DIAGNOSIS — Z992 Dependence on renal dialysis: Secondary | ICD-10-CM | POA: Diagnosis not present

## 2020-04-21 DIAGNOSIS — I871 Compression of vein: Secondary | ICD-10-CM | POA: Diagnosis not present

## 2020-04-21 DIAGNOSIS — I129 Hypertensive chronic kidney disease with stage 1 through stage 4 chronic kidney disease, or unspecified chronic kidney disease: Secondary | ICD-10-CM | POA: Diagnosis not present

## 2020-04-21 DIAGNOSIS — T82858A Stenosis of vascular prosthetic devices, implants and grafts, initial encounter: Secondary | ICD-10-CM | POA: Diagnosis not present

## 2020-04-21 DIAGNOSIS — Z992 Dependence on renal dialysis: Secondary | ICD-10-CM | POA: Diagnosis not present

## 2020-04-21 DIAGNOSIS — N186 End stage renal disease: Secondary | ICD-10-CM | POA: Diagnosis not present

## 2020-04-21 NOTE — Telephone Encounter (Signed)
Called patient and LVM to call back to set up a follow up appointment from hospital visit. 04/21/20

## 2020-04-22 ENCOUNTER — Telehealth: Payer: Self-pay | Admitting: Cardiology

## 2020-04-22 DIAGNOSIS — N2581 Secondary hyperparathyroidism of renal origin: Secondary | ICD-10-CM | POA: Diagnosis not present

## 2020-04-22 DIAGNOSIS — Z992 Dependence on renal dialysis: Secondary | ICD-10-CM | POA: Diagnosis not present

## 2020-04-22 DIAGNOSIS — N186 End stage renal disease: Secondary | ICD-10-CM | POA: Diagnosis not present

## 2020-04-22 NOTE — Telephone Encounter (Signed)
Patient called stating that he is very fatigue with shortness of breath. Requesting to speak or see Dr. Domenic Polite. An appointment was offered to patient to see Jonni Sanger or an extender in the [employer]. Patient did not want to set up appointment. He is coming into [facility] on 04/23/2020 for an Echo.

## 2020-04-23 ENCOUNTER — Ambulatory Visit (INDEPENDENT_AMBULATORY_CARE_PROVIDER_SITE_OTHER): Payer: Medicare HMO

## 2020-04-23 DIAGNOSIS — I5022 Chronic systolic (congestive) heart failure: Secondary | ICD-10-CM

## 2020-04-23 LAB — ECHOCARDIOGRAM COMPLETE
AV Vena cont: 0.41 cm
Area-P 1/2: 4.75 cm2
Calc EF: 42.1 %
MV M vel: 2.68 m/s
MV Peak grad: 28.6 mmHg
P 1/2 time: 936 msec
S' Lateral: 3.96 cm
Single Plane A2C EF: 40.5 %
Single Plane A4C EF: 42.5 %

## 2020-04-24 ENCOUNTER — Telehealth: Payer: Self-pay | Admitting: *Deleted

## 2020-04-24 DIAGNOSIS — N2581 Secondary hyperparathyroidism of renal origin: Secondary | ICD-10-CM | POA: Diagnosis not present

## 2020-04-24 DIAGNOSIS — N186 End stage renal disease: Secondary | ICD-10-CM | POA: Diagnosis not present

## 2020-04-24 DIAGNOSIS — Z992 Dependence on renal dialysis: Secondary | ICD-10-CM | POA: Diagnosis not present

## 2020-04-24 NOTE — Telephone Encounter (Signed)
-----   Message from [name], MD sent at 04/24/2020  8:30 AM EST ----- Results reviewed.  LVEF 50% represents a significant improvement from previous assessment.  Continue with current medications and follow-up plan.

## 2020-04-27 DIAGNOSIS — N186 End stage renal disease: Secondary | ICD-10-CM | POA: Diagnosis not present

## 2020-04-27 DIAGNOSIS — Z992 Dependence on renal dialysis: Secondary | ICD-10-CM | POA: Diagnosis not present

## 2020-04-27 DIAGNOSIS — N2581 Secondary hyperparathyroidism of renal origin: Secondary | ICD-10-CM | POA: Diagnosis not present

## 2020-04-28 ENCOUNTER — Other Ambulatory Visit (INDEPENDENT_AMBULATORY_CARE_PROVIDER_SITE_OTHER): Payer: Self-pay | Admitting: Internal Medicine

## 2020-04-28 NOTE — Telephone Encounter (Signed)
Patient informed. Copy sent to PCP °

## 2020-04-28 NOTE — Telephone Encounter (Signed)
Reports symptoms have improved and he is not having problems at this time. Advised to contact our office if symptoms reoccur. Verbalized understanding

## 2020-04-29 DIAGNOSIS — N186 End stage renal disease: Secondary | ICD-10-CM | POA: Diagnosis not present

## 2020-04-29 DIAGNOSIS — N2581 Secondary hyperparathyroidism of renal origin: Secondary | ICD-10-CM | POA: Diagnosis not present

## 2020-04-29 DIAGNOSIS — Z992 Dependence on renal dialysis: Secondary | ICD-10-CM | POA: Diagnosis not present

## 2020-05-01 DIAGNOSIS — Z992 Dependence on renal dialysis: Secondary | ICD-10-CM | POA: Diagnosis not present

## 2020-05-01 DIAGNOSIS — N186 End stage renal disease: Secondary | ICD-10-CM | POA: Diagnosis not present

## 2020-05-01 DIAGNOSIS — N2581 Secondary hyperparathyroidism of renal origin: Secondary | ICD-10-CM | POA: Diagnosis not present

## 2020-05-04 ENCOUNTER — Telehealth (INDEPENDENT_AMBULATORY_CARE_PROVIDER_SITE_OTHER): Payer: Self-pay

## 2020-05-04 DIAGNOSIS — N186 End stage renal disease: Secondary | ICD-10-CM | POA: Diagnosis not present

## 2020-05-04 DIAGNOSIS — N2581 Secondary hyperparathyroidism of renal origin: Secondary | ICD-10-CM | POA: Diagnosis not present

## 2020-05-04 DIAGNOSIS — Z992 Dependence on renal dialysis: Secondary | ICD-10-CM | POA: Diagnosis not present

## 2020-05-04 NOTE — Telephone Encounter (Signed)
Got a Fax from [employer] . Stating they no longer cover the O2 concentrator & equipment. Stated " Benefit Excluded" ( Code E1390 & E 1392).  If he has to contact [facility] on the reason they are not covering anymore. So he will be covered by 2022 going forward.

## 2020-05-05 ENCOUNTER — Other Ambulatory Visit: Payer: Self-pay

## 2020-05-05 ENCOUNTER — Encounter (INDEPENDENT_AMBULATORY_CARE_PROVIDER_SITE_OTHER): Payer: Self-pay | Admitting: Internal Medicine

## 2020-05-05 ENCOUNTER — Ambulatory Visit (INDEPENDENT_AMBULATORY_CARE_PROVIDER_SITE_OTHER): Payer: Medicare HMO | Admitting: Internal Medicine

## 2020-05-05 VITALS — BP 91/69 | HR 76 | Temp 97.0°F | Resp 18 | Ht 72.0 in | Wt 159.0 lb

## 2020-05-05 DIAGNOSIS — N184 Chronic kidney disease, stage 4 (severe): Secondary | ICD-10-CM

## 2020-05-05 DIAGNOSIS — E1122 Type 2 diabetes mellitus with diabetic chronic kidney disease: Secondary | ICD-10-CM | POA: Diagnosis not present

## 2020-05-05 DIAGNOSIS — E039 Hypothyroidism, unspecified: Secondary | ICD-10-CM

## 2020-05-05 DIAGNOSIS — I10 Essential (primary) hypertension: Secondary | ICD-10-CM

## 2020-05-05 NOTE — Progress Notes (Signed)
Metrics: Intervention Frequency ACO  Documented Smoking Status Yearly  Screened one or more times in 24 months  Cessation Counseling or  Active cessation medication Past 24 months  Past 24 months   Guideline developer: UpToDate (See UpToDate for funding source) Date Released: 2014       Wellness Office Visit  Subjective:  Patient ID: Lawrence Bonilla, male    DOB: 14-Jul-1950  Age: 69 y.o. MRN: [id number]  CC: This man comes in more than 2 weeks post hospitalization.  He was hospitalized for syncopal episode. HPI  He has been started on midodrine apparently.  Amlodipine has been long discontinued. He receives dialysis at least 3 times a week from what I can see. He feels okay.  He has no specific complaints. The last time his thyroid was checked, he was still deemed to be underactive and he is taking NP thyroid 60 mg daily.  He tells me his energy levels are still somewhat low. Past Medical History:  Diagnosis Date  . Atrial fibrillation ([place])   . Chronic systolic CHF (congestive heart failure) ([place])   . Congestive heart failure (CHF) ([place])   . DM type 2 (diabetes mellitus, type 2) ([place])   . ESRD on hemodialysis ([place])   . Essential hypertension   . Headache   . Hyperlipidemia   . Hypothyroidism   . Lymphedema   . OSA on CPAP   . PE (pulmonary thromboembolism) ([place])    High probability for PE on VQ scan 01/29/19  . PVD (peripheral vascular disease) ([place])   . Secondary cardiomyopathy ([place])   . Stroke ([place])   . Vitamin D deficiency disease   . Wears glasses    Past Surgical History:  Procedure Laterality Date  . APPENDECTOMY    . AV FISTULA PLACEMENT Left 11/04/2019   Procedure: left radiocephalic ARTERIOVENOUS (AV) FISTULA CREATION;  Surgeon: Rosetta Posner, MD;  Location: [facility];  Service: Vascular;  Laterality: Left;  . BARIATRIC SURGERY    . CATARACT EXTRACTION W/ INTRAOCULAR LENS  IMPLANT, BILATERAL    . HERNIA REPAIR    . LIGATION OF COMPETING BRANCHES OF ARTERIOVENOUS  FISTULA Left 02/06/2020   Procedure: LIGATION OF COMPETING BRANCHES OF LEFT ARM ARTERIOVENOUS FISTULA;  Surgeon: Rosetta Posner, MD;  Location: [facility];  Service: Vascular;  Laterality: Left;  . RIGHT HEART CATH N/A 05/02/2019   Procedure: RIGHT HEART CATH;  Surgeon: Jolaine Artist, MD;  Location: [facility];  Service: Cardiovascular;  Laterality: N/A;  . TONSILLECTOMY    . WISDOM TOOTH EXTRACTION       Family History  Problem Relation Age of Onset  . 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   Social History   Tobacco Use  . Smoking status: Never Smoker  . Smokeless tobacco: Never Used  Substance Use Topics  . Alcohol use: Yes    Comment: 2 SCOTCH     Current Meds  Medication Sig  . acetaminophen (TYLENOL) 325 MG tablet Take 2 tablets (650 mg total) by mouth every 6 (six) hours as needed for mild pain (or Fever >/= 101).  Marland Kitchen atorvastatin (LIPITOR) 40 MG tablet Take 1 tablet (40 mg total) by mouth daily.  . calcitRIOL (ROCALTROL) 0.25 MCG capsule Take 0.25 mcg by mouth daily.  . Calcium Carb-Cholecalciferol (CALCIUM 600 + D PO) Take 600 mg by mouth daily.   Marland Kitchen  folic acid (FOLVITE) 1 MG tablet Take 1 tablet (1 mg total) by mouth daily.  . furosemide (LASIX) 40 MG tablet Take 2 tablets (80 mg total) by mouth 2 (two) times daily.  . metoprolol succinate (TOPROL-XL) 25 MG 24 hr tablet Take 1 tablet (25 mg total) by mouth daily. Take with or immediately following a meal.  . midodrine (PROAMATINE) 5 MG tablet Take 1 tablet (5 mg total) by mouth 3 (three) times daily with meals.  . Multiple Vitamin (MULTIVITAMIN WITH MINERALS) TABS tablet Take 1 tablet by mouth daily.  . NP THYROID 60 MG tablet TAKE 1 TABLET(60 MG) BY MOUTH DAILY BEFORE BREAKFAST  . potassium chloride (KLOR-CON) 10 MEQ tablet Take 1 tablet (10 mEq total) by mouth daily.  Marland Kitchen thiamine  100 MG tablet Take 1 tablet (100 mg total) by mouth daily.      Depression screen PHQ 2/9 05/09/2019  Decreased Interest 0  Down, Depressed, Hopeless 0  PHQ - 2 Score 0     Objective:   Today's Vitals: BP 91/69 (BP Location: Right Arm, Patient Position: Sitting, Cuff Size: Normal)   Pulse 76   Temp (!) 97 F (36.1 C) (Temporal)   Resp 18   Ht 6' (1.829 m)   Wt 159 lb (72.1 kg)   SpO2 98%   BMI 21.56 kg/m  Vitals with BMI 05/05/2020 04/14/2020 04/14/2020  Height 6\' 0"  - -  Weight 159 lbs - -  BMI 16.10 - -  Systolic 91 (No Data) 960  Diastolic 69 (No Data) 70  Pulse 76 - 78     Physical Exam   He looks frail.  Blood pressure on the low side.  He is alert and orientated.    Assessment   1. Hypothyroidism, adult   2. Type 2 diabetes mellitus with stage 4 chronic kidney disease, without long-term current use of insulin ([place])   3. Hypertension, unspecified type       Tests ordered Orders Placed This Encounter  Procedures  . COMPLETE METABOLIC PANEL WITH GFR  . T3, free  . T4, free  . TSH     Plan: 1. He will continue with current dose of NP thyroid and we will check thyroid function test today to see if we need to further optimize. 2. He will continue with antihypertensive therapy although his blood pressures are slightly on the low side today. 3. He will undergo dialysis tomorrow for his end-stage renal disease.  This is stemming from his diabetes. 4. Further recommendations will depend on blood results and I will see him in about 4 months time for follow-up.   No orders of the defined types were placed in this encounter.   Doree Albee, MD

## 2020-05-06 ENCOUNTER — Other Ambulatory Visit (INDEPENDENT_AMBULATORY_CARE_PROVIDER_SITE_OTHER): Payer: Self-pay | Admitting: Internal Medicine

## 2020-05-06 DIAGNOSIS — N186 End stage renal disease: Secondary | ICD-10-CM | POA: Diagnosis not present

## 2020-05-06 DIAGNOSIS — N2581 Secondary hyperparathyroidism of renal origin: Secondary | ICD-10-CM | POA: Diagnosis not present

## 2020-05-06 DIAGNOSIS — Z992 Dependence on renal dialysis: Secondary | ICD-10-CM | POA: Diagnosis not present

## 2020-05-06 LAB — COMPLETE METABOLIC PANEL WITH GFR
AG Ratio: 1.2 (calc) (ref 1.0–2.5)
ALT: 24 U/L (ref 9–46)
AST: 31 U/L (ref 10–35)
Albumin: 3.9 g/dL (ref 3.6–5.1)
Alkaline phosphatase (APISO): 133 U/L (ref 35–144)
BUN/Creatinine Ratio: 10 (calc) (ref 6–22)
BUN: 46 mg/dL — ABNORMAL HIGH (ref 7–25)
CO2: 25 mmol/L (ref 20–32)
Calcium: 9.2 mg/dL (ref 8.6–10.3)
Chloride: 97 mmol/L — ABNORMAL LOW (ref 98–110)
Creat: 4.82 mg/dL — ABNORMAL HIGH (ref 0.70–1.25)
GFR, Est African American: 13 mL/min/{1.73_m2} — ABNORMAL LOW (ref >=60)
GFR, Est Non African American: 11 mL/min/{1.73_m2} — ABNORMAL LOW (ref >=60)
Globulin: 3.2 g/dL (calc) (ref 1.9–3.7)
Glucose, Bld: 88 mg/dL (ref 65–139)
Potassium: 5.2 mmol/L (ref 3.5–5.3)
Sodium: 137 mmol/L (ref 135–146)
Total Bilirubin: 0.5 mg/dL (ref 0.2–1.2)
Total Protein: 7.1 g/dL (ref 6.1–8.1)

## 2020-05-06 LAB — T4, FREE: Free T4: 1 ng/dL (ref 0.8–1.8)

## 2020-05-06 LAB — T3, FREE: T3, Free: 3 pg/mL (ref 2.3–4.2)

## 2020-05-06 LAB — TSH: TSH: 6.24 mIU/L — ABNORMAL HIGH (ref 0.40–4.50)

## 2020-05-06 MED ORDER — NP THYROID 90 MG PO TABS: 90.0000 mg | ORAL_TABLET | Freq: Every day | ORAL | 3 refills | Status: AC

## 2020-05-08 DIAGNOSIS — Z992 Dependence on renal dialysis: Secondary | ICD-10-CM | POA: Diagnosis not present

## 2020-05-08 DIAGNOSIS — N2581 Secondary hyperparathyroidism of renal origin: Secondary | ICD-10-CM | POA: Diagnosis not present

## 2020-05-08 DIAGNOSIS — N186 End stage renal disease: Secondary | ICD-10-CM | POA: Diagnosis not present

## 2020-05-11 DIAGNOSIS — N2581 Secondary hyperparathyroidism of renal origin: Secondary | ICD-10-CM | POA: Diagnosis not present

## 2020-05-11 DIAGNOSIS — N186 End stage renal disease: Secondary | ICD-10-CM | POA: Diagnosis not present

## 2020-05-11 DIAGNOSIS — Z992 Dependence on renal dialysis: Secondary | ICD-10-CM | POA: Diagnosis not present

## 2020-05-11 NOTE — Progress Notes (Signed)
Please call this patient and make sure he saw the MyChart message that I sent him.  If not, please make sure that you read it to him as I have increased his thyroid dose.  Thanks

## 2020-05-11 NOTE — Telephone Encounter (Signed)
Do you have any updates regarding this patient? I want to make sure he is getting his oxygen. Thank you.

## 2020-05-13 DIAGNOSIS — N186 End stage renal disease: Secondary | ICD-10-CM | POA: Diagnosis not present

## 2020-05-13 DIAGNOSIS — Z992 Dependence on renal dialysis: Secondary | ICD-10-CM | POA: Diagnosis not present

## 2020-05-13 DIAGNOSIS — N2581 Secondary hyperparathyroidism of renal origin: Secondary | ICD-10-CM | POA: Diagnosis not present

## 2020-05-13 NOTE — Telephone Encounter (Signed)
Okay thank you, let me know if there is anything else I need to do on his behalf.

## 2020-05-13 NOTE — Telephone Encounter (Signed)
When patient came last week. Gave him a copy of form sent on his behalf. Pt is getting oxygen in home. This is when the two services was in home at same time.

## 2020-05-15 DIAGNOSIS — N2581 Secondary hyperparathyroidism of renal origin: Secondary | ICD-10-CM | POA: Diagnosis not present

## 2020-05-15 DIAGNOSIS — Z992 Dependence on renal dialysis: Secondary | ICD-10-CM | POA: Diagnosis not present

## 2020-05-15 DIAGNOSIS — N186 End stage renal disease: Secondary | ICD-10-CM | POA: Diagnosis not present

## 2020-05-17 ENCOUNTER — Other Ambulatory Visit (INDEPENDENT_AMBULATORY_CARE_PROVIDER_SITE_OTHER): Payer: Self-pay | Admitting: Internal Medicine

## 2020-05-18 DIAGNOSIS — N2581 Secondary hyperparathyroidism of renal origin: Secondary | ICD-10-CM | POA: Diagnosis not present

## 2020-05-18 DIAGNOSIS — Z992 Dependence on renal dialysis: Secondary | ICD-10-CM | POA: Diagnosis not present

## 2020-05-18 DIAGNOSIS — N186 End stage renal disease: Secondary | ICD-10-CM | POA: Diagnosis not present

## 2020-05-20 DIAGNOSIS — Z992 Dependence on renal dialysis: Secondary | ICD-10-CM | POA: Diagnosis not present

## 2020-05-20 DIAGNOSIS — G4733 Obstructive sleep apnea (adult) (pediatric): Secondary | ICD-10-CM | POA: Diagnosis not present

## 2020-05-20 DIAGNOSIS — N2581 Secondary hyperparathyroidism of renal origin: Secondary | ICD-10-CM | POA: Diagnosis not present

## 2020-05-20 DIAGNOSIS — N186 End stage renal disease: Secondary | ICD-10-CM | POA: Diagnosis not present

## 2020-05-22 ENCOUNTER — Emergency Department (HOSPITAL_COMMUNITY)
Admission: EM | Admit: 2020-05-22 | Discharge: 2020-05-22 | Disposition: A | Discharge status: Home / Self Care | Payer: Medicare HMO | Attending: Emergency Medicine | Admitting: Emergency Medicine

## 2020-05-22 ENCOUNTER — Other Ambulatory Visit: Payer: Self-pay

## 2020-05-22 ENCOUNTER — Encounter (HOSPITAL_COMMUNITY): Payer: Self-pay | Admitting: Emergency Medicine

## 2020-05-22 ENCOUNTER — Emergency Department (HOSPITAL_COMMUNITY): Payer: Medicare HMO

## 2020-05-22 DIAGNOSIS — R0602 Shortness of breath: Secondary | ICD-10-CM

## 2020-05-22 DIAGNOSIS — J90 Pleural effusion, not elsewhere classified: Secondary | ICD-10-CM | POA: Diagnosis not present

## 2020-05-22 DIAGNOSIS — U071 COVID-19: Secondary | ICD-10-CM | POA: Diagnosis not present

## 2020-05-22 DIAGNOSIS — I132 Hypertensive heart and chronic kidney disease with heart failure and with stage 5 chronic kidney disease, or end stage renal disease: Secondary | ICD-10-CM | POA: Insufficient documentation

## 2020-05-22 DIAGNOSIS — R5383 Other fatigue: Secondary | ICD-10-CM | POA: Diagnosis not present

## 2020-05-22 DIAGNOSIS — I5023 Acute on chronic systolic (congestive) heart failure: Secondary | ICD-10-CM | POA: Diagnosis not present

## 2020-05-22 DIAGNOSIS — Z992 Dependence on renal dialysis: Secondary | ICD-10-CM | POA: Insufficient documentation

## 2020-05-22 DIAGNOSIS — N186 End stage renal disease: Secondary | ICD-10-CM | POA: Insufficient documentation

## 2020-05-22 DIAGNOSIS — R066 Hiccough: Secondary | ICD-10-CM | POA: Diagnosis not present

## 2020-05-22 DIAGNOSIS — E039 Hypothyroidism, unspecified: Secondary | ICD-10-CM | POA: Insufficient documentation

## 2020-05-22 DIAGNOSIS — I4891 Unspecified atrial fibrillation: Secondary | ICD-10-CM | POA: Diagnosis not present

## 2020-05-22 DIAGNOSIS — J9811 Atelectasis: Secondary | ICD-10-CM | POA: Diagnosis not present

## 2020-05-22 DIAGNOSIS — Z79899 Other long term (current) drug therapy: Secondary | ICD-10-CM | POA: Diagnosis not present

## 2020-05-22 DIAGNOSIS — Z7901 Long term (current) use of anticoagulants: Secondary | ICD-10-CM | POA: Diagnosis not present

## 2020-05-22 DIAGNOSIS — E1122 Type 2 diabetes mellitus with diabetic chronic kidney disease: Secondary | ICD-10-CM | POA: Diagnosis not present

## 2020-05-22 DIAGNOSIS — Z86711 Personal history of pulmonary embolism: Secondary | ICD-10-CM | POA: Diagnosis not present

## 2020-05-22 DIAGNOSIS — I129 Hypertensive chronic kidney disease with stage 1 through stage 4 chronic kidney disease, or unspecified chronic kidney disease: Secondary | ICD-10-CM | POA: Diagnosis not present

## 2020-05-22 DIAGNOSIS — R0600 Dyspnea, unspecified: Secondary | ICD-10-CM | POA: Diagnosis not present

## 2020-05-22 DIAGNOSIS — R059 Cough, unspecified: Secondary | ICD-10-CM | POA: Diagnosis not present

## 2020-05-22 DIAGNOSIS — N2581 Secondary hyperparathyroidism of renal origin: Secondary | ICD-10-CM | POA: Diagnosis not present

## 2020-05-22 DIAGNOSIS — I517 Cardiomegaly: Secondary | ICD-10-CM | POA: Diagnosis not present

## 2020-05-22 LAB — CBC WITH DIFFERENTIAL/PLATELET
Abs Immature Granulocytes: 0.01 10*3/uL (ref 0.00–0.07)
Basophils Absolute: 0 10*3/uL (ref 0.0–0.1)
Basophils Relative: 0 %
Eosinophils Absolute: 0 10*3/uL (ref 0.0–0.5)
Eosinophils Relative: 0 %
HCT: 33.1 % — ABNORMAL LOW (ref 39.0–52.0)
Hemoglobin: 10.3 g/dL — ABNORMAL LOW (ref 13.0–17.0)
Immature Granulocytes: 0 %
Lymphocytes Relative: 14 %
Lymphs Abs: 0.5 10*3/uL — ABNORMAL LOW (ref 0.7–4.0)
MCH: 34.4 pg — ABNORMAL HIGH (ref 26.0–34.0)
MCHC: 31.1 g/dL (ref 30.0–36.0)
MCV: 110.7 fL — ABNORMAL HIGH (ref 80.0–100.0)
Monocytes Absolute: 0.7 10*3/uL (ref 0.1–1.0)
Monocytes Relative: 22 %
Neutro Abs: 2.1 10*3/uL (ref 1.7–7.7)
Neutrophils Relative %: 64 %
Platelets: 166 10*3/uL (ref 150–400)
RBC: 2.99 MIL/uL — ABNORMAL LOW (ref 4.22–5.81)
RDW: 14.9 % (ref 11.5–15.5)
WBC: 3.3 10*3/uL — ABNORMAL LOW (ref 4.0–10.5)
nRBC: 0 % (ref 0.0–0.2)

## 2020-05-22 LAB — RESP PANEL BY RT-PCR (FLU A&B, COVID) ARPGX2
Influenza A by PCR: NEGATIVE
Influenza B by PCR: NEGATIVE
SARS Coronavirus 2 by RT PCR: POSITIVE — AB

## 2020-05-22 LAB — BASIC METABOLIC PANEL
Anion gap: 16 — ABNORMAL HIGH (ref 5–15)
BUN: 64 mg/dL — ABNORMAL HIGH (ref 8–23)
CO2: 24 mmol/L (ref 22–32)
Calcium: 8.8 mg/dL — ABNORMAL LOW (ref 8.9–10.3)
Chloride: 96 mmol/L — ABNORMAL LOW (ref 98–111)
Creatinine, Ser: 6.4 mg/dL — ABNORMAL HIGH (ref 0.61–1.24)
GFR, Estimated: 9 mL/min — ABNORMAL LOW (ref >60)
Glucose, Bld: 92 mg/dL (ref 70–99)
Potassium: 4.9 mmol/L (ref 3.5–5.1)
Sodium: 136 mmol/L (ref 135–145)

## 2020-05-22 LAB — BRAIN NATRIURETIC PEPTIDE: B Natriuretic Peptide: 1467 pg/mL — ABNORMAL HIGH (ref 0.0–100.0)

## 2020-05-22 NOTE — Discharge Instructions (Addendum)
You were seen in the emergency department for a few days of shortness of breath. You tested positive for Covid. You'll need to isolate for 10 days from the beginning of your symptoms. The monoclonal antibody clinic should call you if you qualify for antibody treatment. There is also a Covid clinic in [place]. Return to the emergency department if worsening or concerning symptoms. Your dialysis is scheduled for 4:30 PM today.

## 2020-05-22 NOTE — ED Notes (Signed)
CRITICAL VALUE ALERT  Critical Value:  Positive Covid  Date & Time Notied:  05/22/20  0745  Provider Notified: Melina Copa  Orders Received/Actions taken:

## 2020-05-22 NOTE — ED Triage Notes (Signed)
Pt c/o SOB, cough, and fatigue for past 2 days.

## 2020-05-22 NOTE — ED Provider Notes (Signed)
[facility] EMERGENCY DEPARTMENT Provider Note   CSN: [id number] Arrival date & time: 05/22/20  0158     History Chief Complaint  Patient presents with  . Shortness of Breath    Lawrence Bonilla is a 69 y.o. male.  He has a history of A. fib, anticoagulation, CHF, end-stage renal disease and is due for dialysis today.  He said he has not slept well for the last 2 days and feels short of breath.  He seems to feel it is related to his teeth, he feels a clicking sensation when he breathes.  He is also had hiccups.  No headache or chest pain.  No vomiting.  Lawrence Bonilla he was recently in the hospital after a fall and had a head bleed.  He said he feels better since he got here and has been sleeping.  He feels back to baseline now.  Blood pressure low but he states it is always low.  He is Covid vaccinated.  The history is provided by the patient.  Shortness of Breath Severity:  Moderate Onset quality:  Gradual Timing:  Intermittent Progression:  Improving Chronicity:  New Relieved by:  None tried Worsened by:  Nothing Ineffective treatments:  None tried Associated symptoms: no abdominal pain, no chest pain, no cough, no fever, no headaches, no hemoptysis, no neck pain, no rash, no sore throat, no syncope and no wheezing        Past Medical History:  Diagnosis Date  . Atrial fibrillation ([place])   . Chronic systolic CHF (congestive heart failure) ([place])   . Congestive heart failure (CHF) ([place])   . DM type 2 (diabetes mellitus, type 2) ([place])   . ESRD on hemodialysis ([place])   . Essential hypertension   . Headache   . Hyperlipidemia   . Hypothyroidism   . Lymphedema   . OSA on CPAP   . PE (pulmonary thromboembolism) ([place])    High probability for PE on VQ scan 01/29/19  . PVD (peripheral vascular disease) ([place])   . Secondary cardiomyopathy ([place])   . Stroke ([place])   . Vitamin D deficiency disease   . Wears glasses     Patient Active Problem List   Diagnosis Date Noted  . Goals of care,  counseling/discussion   . Palliative care by specialist   . DNR (do not resuscitate) discussion   . Abnormal CT of the head 04/13/2020  . Nasal fracture 04/13/2020  . Essential hypertension   . Macrocytic anemia   . OSA on CPAP   . ESRD on hemodialysis ([place])   . Chronic systolic CHF (congestive heart failure) ([place])   . Hypothyroidism, adult 02/06/2019  . DM type 2 (diabetes mellitus, type 2) ([place]) 02/06/2019  . Vitamin D deficiency disease 02/06/2019  . Acute on chronic systolic CHF (congestive heart failure) ([place]) 01/28/2019  . CKD (chronic kidney disease), stage IV ([place]) 01/28/2019  . Pleural effusion on right 01/28/2019  . Stroke ([place]) 01/28/2019  . Supratherapeutic INR 01/28/2019    Past Surgical History:  Procedure Laterality Date  . APPENDECTOMY    . AV FISTULA PLACEMENT Left 11/04/2019   Procedure: left radiocephalic ARTERIOVENOUS (AV) FISTULA CREATION;  Surgeon: Rosetta Posner, MD;  Location: [facility];  Service: Vascular;  Laterality: Left;  . BARIATRIC SURGERY    . CATARACT EXTRACTION W/ INTRAOCULAR LENS  IMPLANT, BILATERAL    . HERNIA REPAIR    . LIGATION OF COMPETING BRANCHES OF ARTERIOVENOUS FISTULA Left 02/06/2020   Procedure: LIGATION OF COMPETING  BRANCHES OF LEFT ARM ARTERIOVENOUS FISTULA;  Surgeon: Rosetta Posner, MD;  Location: [facility];  Service: Vascular;  Laterality: Left;  . RIGHT HEART CATH N/A 05/02/2019   Procedure: RIGHT HEART CATH;  Surgeon: Jolaine Artist, MD;  Location: [facility];  Service: Cardiovascular;  Laterality: N/A;  . TONSILLECTOMY    . WISDOM TOOTH EXTRACTION         Family History  Problem Relation Age of Onset  . Asthma Mother     Social History   Tobacco Use  . Smoking status: Never Smoker  . Smokeless tobacco: Never Used  Vaping Use  . Vaping Use: Never used  Substance Use Topics  . Alcohol use: Yes    Comment: 2 SCOTCH   . Drug use: Never    Home Medications Prior to Admission medications   Medication Sig Start  Date End Date Taking? Authorizing Provider  acetaminophen (TYLENOL) 325 MG tablet Take 2 tablets (650 mg total) by mouth every 6 (six) hours as needed for mild pain (or Fever >/= 101). 04/14/20   Roxan Hockey, MD  atorvastatin (LIPITOR) 40 MG tablet Take 1 tablet (40 mg total) by mouth daily. 04/14/20   Roxan Hockey, MD  AURYXIA 1 GM 210 MG(Fe) tablet Take 420 mg by mouth 2 (two) times daily.  11/21/19   [provider]  calcitRIOL (ROCALTROL) 0.25 MCG capsule Take 0.25 mcg by mouth daily.    [provider]  calcium acetate (PHOSLO) 667 MG capsule Take 667 mg by mouth 3 (three) times daily. 04/08/20   [provider]  Calcium Carb-Cholecalciferol (CALCIUM 600 + D PO) Take 600 mg by mouth daily.     [provider]  fluticasone (FLONASE) 50 MCG/ACT nasal spray Place 1 spray into both nostrils daily. Patient not taking: Reported on 05/05/2020 02/19/20   Ailene Ards, NP  folic acid (FOLVITE) 1 MG tablet Take 1 tablet (1 mg total) by mouth daily. 04/15/20   Roxan Hockey, MD  furosemide (LASIX) 40 MG tablet Take 2 tablets (80 mg total) by mouth 2 (two) times daily. 01/16/20   Doree Albee, MD  metoprolol succinate (TOPROL-XL) 25 MG 24 hr tablet Take 1 tablet (25 mg total) by mouth daily. Take with or immediately following a meal. 04/14/20   Emokpae, Courage, MD  midodrine (PROAMATINE) 5 MG tablet Take 1 tablet (5 mg total) by mouth 3 (three) times daily with meals. 04/14/20   Roxan Hockey, MD  Multiple Vitamin (MULTIVITAMIN WITH MINERALS) TABS tablet Take 1 tablet by mouth daily. 04/15/20   Roxan Hockey, MD  NP THYROID 90 MG tablet Take 1 tablet (90 mg total) by mouth daily. 05/06/20   Doree Albee, MD  potassium chloride (KLOR-CON) 10 MEQ tablet Take 1 tablet (10 mEq total) by mouth daily. 02/18/20   Hurshel Party C, MD  potassium chloride (MICRO-K) 10 MEQ CR capsule TAKE 1 CAPSULE(10 MEQ) BY MOUTH DAILY 05/18/20   Hurshel Party C, MD   thiamine 100 MG tablet Take 1 tablet (100 mg total) by mouth daily. 04/15/20   Roxan Hockey, MD    Allergies    Patient has no known allergies.  Review of Systems   Review of Systems  Constitutional: Negative for fever.  HENT: Negative for sore throat.   Eyes: Negative for visual disturbance.  Respiratory: Positive for shortness of breath. Negative for cough, hemoptysis and wheezing.   Cardiovascular: Negative for chest pain and syncope.  Gastrointestinal: Negative for abdominal pain.  Genitourinary: Negative for dysuria.  Musculoskeletal: Negative for neck pain.  Skin: Negative for rash.  Neurological: Negative for headaches.    Physical Exam Updated Vital Signs BP 100/71   Pulse 68   Temp 98.2 F (36.8 C) (Oral)   Resp 13   Ht 6' (1.829 m)   Wt 72.6 kg   SpO2 97%   BMI 21.70 kg/m   Physical Exam Vitals and nursing note reviewed.  Constitutional:      Appearance: He is well-developed and well-nourished.  HENT:     Head: Normocephalic and atraumatic.  Eyes:     Conjunctiva/sclera: Conjunctivae normal.  Cardiovascular:     Rate and Rhythm: Normal rate and regular rhythm.     Heart sounds: No murmur heard.   Pulmonary:     Effort: Pulmonary effort is normal. No respiratory distress.     Breath sounds: Normal breath sounds.  Abdominal:     Palpations: Abdomen is soft.     Tenderness: There is no abdominal tenderness.  Musculoskeletal:        General: No edema. Normal range of motion.     Cervical back: Neck supple.     Right lower leg: No tenderness. No edema.     Left lower leg: No tenderness. No edema.  Skin:    General: Skin is warm and dry.     Capillary Refill: Capillary refill takes less than 2 seconds.  Neurological:     General: No focal deficit present.     Mental Status: He is alert.  Psychiatric:        Mood and Affect: Mood and affect normal.     ED Results / Procedures / Treatments   Labs (all labs ordered are listed, but only  abnormal results are displayed) Labs Reviewed  RESP PANEL BY RT-PCR (FLU A&B, COVID) ARPGX2 - Abnormal; Notable for the following components:      Result Value   SARS Coronavirus 2 by RT PCR POSITIVE (*)    All other components within normal limits  BASIC METABOLIC PANEL - Abnormal; Notable for the following components:   Chloride 96 (*)    BUN 64 (*)    Creatinine, Ser 6.40 (*)    Calcium 8.8 (*)    GFR, Estimated 9 (*)    Anion gap 16 (*)    All other components within normal limits  CBC WITH DIFFERENTIAL/PLATELET - Abnormal; Notable for the following components:   WBC 3.3 (*)    RBC 2.99 (*)    Hemoglobin 10.3 (*)    HCT 33.1 (*)    MCV 110.7 (*)    MCH 34.4 (*)    Lymphs Abs 0.5 (*)    All other components within normal limits  BRAIN NATRIURETIC PEPTIDE - Abnormal; Notable for the following components:   B Natriuretic Peptide 1,467.0 (*)    All other components within normal limits    EKG EKG Interpretation  Date/Time:  Friday May 22 2020 03:48:17 EST Ventricular Rate:  65 PR Interval:    QRS Duration: 110 QT Interval:  433 QTC Calculation: 451 R Axis:   -85 Text Interpretation: Atrial fibrillation Ventricular premature complex Incomplete RBBB and LAFB Abnormal R-wave progression, late transition Interpretation limited secondary to artifact Confirmed by Ripley Fraise [phone]) on 05/22/2020 4:01:05 AM   Radiology DG Chest Port 1 View  Result Date: 05/22/2020 CLINICAL DATA:  Dyspnea, cough, fatigue EXAM: PORTABLE CHEST 1 VIEW COMPARISON:  04/13/2020 FINDINGS: The lungs are symmetrically well expanded. Moderate right  pleural effusion is again seen with compressive atelectasis of the right lung base. No superimposed confluent pulmonary infiltrate. No pneumothorax. No pleural effusion on the left. Mild to moderate cardiomegaly is stable. Right internal jugular hemodialysis catheter tip noted within the right atrium. No acute bone abnormality. IMPRESSION: Stable  moderate right pleural effusion. Stable mild-to-moderate cardiomegaly. Electronically Signed   By: Fidela Salisbury MD   On: 05/22/2020 04:11    Procedures Procedures (including critical care time)  Medications Ordered in ED Medications - No data to display  ED Course  I have reviewed the triage vital signs and the nursing notes.  Pertinent labs & imaging results that were available during my care of the patient were reviewed by me and considered in my medical decision making (see chart for details).  Clinical Course as of 05/22/20 1708  Fri May 22, 2020  0750 Patient unfortunately tested positive for Covid. Currently we do not have any monoclonal antibody on campus. I will secure message [facility]. Nurse Shirlean Mylar is reaching out to patient's dialysis center to find out a plan from them regarding dialysis today. Patient satting 95% on room air and does not require admission at this time. I updated him with results [MB]    Clinical Course User Index [MB] Hayden Rasmussen, MD   MDM Rules/Calculators/A&P                         This patient complains of shortness of breath; this involves an extensive number of treatment Options and is a complaint that carries with it a high risk of complications and Morbidity. The differential includes CHF, fluid overload, COPD, pneumonia, Covid, metabolic derangement  I ordered, reviewed and interpreted labs, which included CBC with depressed white count question viral, low hemoglobin stable, chemistries with elevated BUN and creatinine reflecting his end-stage renal disease, BNP elevated although lower than baseline, Covid testing positive  I ordered imaging studies which included chest x-ray and I independently    visualized and interpreted imaging which showed right pleural effusion cardiomegaly similar to prior Previous records obtained and reviewed in epic, no recent admissions  After the interventions stated above, I reevaluated the patient and  found patient to be satting well on room air.  He was able to sleep.  He says he feels better than when he arrived.  Tested positive for Covid.  Nurse was able to reach out to his dialysis facility and he will be dialyzed later today.  Return instructions discussed  Lawrence Bonilla was evaluated in Emergency Department on 05/22/2020 for the symptoms described in the history of present illness. He was evaluated in the context of the global COVID-19 pandemic, which necessitated consideration that the patient might be at risk for infection with the SARS-CoV-2 virus that causes COVID-19. Institutional protocols and algorithms that pertain to the evaluation of patients at risk for COVID-19 are in a state of rapid change based on information released by regulatory bodies including the CDC and federal and state organizations. These policies and algorithms were followed during the patient's care in the ED.  Final Clinical Impression(s) / ED Diagnoses Final diagnoses:  COVID-19 virus infection  SOB (shortness of breath)  Hiccups    Rx / DC Orders ED Discharge Orders    None       Hayden Rasmussen, MD 05/22/20 1710

## 2020-05-25 ENCOUNTER — Telehealth (INDEPENDENT_AMBULATORY_CARE_PROVIDER_SITE_OTHER): Payer: Self-pay | Admitting: Nurse Practitioner

## 2020-05-25 DIAGNOSIS — N2581 Secondary hyperparathyroidism of renal origin: Secondary | ICD-10-CM | POA: Diagnosis not present

## 2020-05-25 DIAGNOSIS — Z992 Dependence on renal dialysis: Secondary | ICD-10-CM | POA: Diagnosis not present

## 2020-05-25 DIAGNOSIS — N186 End stage renal disease: Secondary | ICD-10-CM | POA: Diagnosis not present

## 2020-05-25 NOTE — Telephone Encounter (Signed)
Please call this patient and attempt to get him scheduled for a TELEVISIT some time this week. He was recently diagnosed with Covid in the ED and I want to see how he is progressing/offer referrals to any necessary infusion centers.

## 2020-05-25 NOTE — Telephone Encounter (Signed)
Called and LVM for patient to call and schedule a telemedicine visit ASAP.

## 2020-05-26 ENCOUNTER — Telehealth (INDEPENDENT_AMBULATORY_CARE_PROVIDER_SITE_OTHER): Payer: Medicare HMO | Admitting: Nurse Practitioner

## 2020-05-26 ENCOUNTER — Encounter (INDEPENDENT_AMBULATORY_CARE_PROVIDER_SITE_OTHER): Payer: Self-pay

## 2020-05-26 ENCOUNTER — Telehealth (INDEPENDENT_AMBULATORY_CARE_PROVIDER_SITE_OTHER): Payer: Self-pay

## 2020-05-26 NOTE — Telephone Encounter (Signed)
Yes, please try calling either [phone] or [phone] and seeing if they can schedule him. Thank you.

## 2020-05-26 NOTE — Telephone Encounter (Signed)
Since we were unable to get a hold of him on the phone for his virtual visit today I did call the [facility] infusion clinic and left patient's information for them to review to possibly see if he qualifies for treatment. Thank you.

## 2020-05-26 NOTE — Telephone Encounter (Signed)
Called patient several times and was informed by office that he may be at the dialysis center and I did leave several messages and one of the messages I did leave the covid antibody infusion center number for him to call.

## 2020-05-26 NOTE — Telephone Encounter (Signed)
Pt called back and siad he will be able to do 4:15 today. He should be home and settled.

## 2020-05-27 ENCOUNTER — Inpatient Hospital Stay (HOSPITAL_COMMUNITY): Payer: Medicare HMO

## 2020-05-27 ENCOUNTER — Emergency Department (HOSPITAL_COMMUNITY): Payer: Medicare HMO

## 2020-05-27 ENCOUNTER — Other Ambulatory Visit: Payer: Self-pay

## 2020-05-27 ENCOUNTER — Encounter (HOSPITAL_COMMUNITY): Payer: Self-pay | Admitting: Emergency Medicine

## 2020-05-27 ENCOUNTER — Inpatient Hospital Stay (HOSPITAL_COMMUNITY)
Admission: EM | Admit: 2020-05-27 | Discharge: 2020-06-23 | DRG: 871 | Disposition: E | Payer: Medicare HMO | Attending: Pulmonary Disease | Admitting: Pulmonary Disease

## 2020-05-27 ENCOUNTER — Telehealth (INDEPENDENT_AMBULATORY_CARE_PROVIDER_SITE_OTHER): Payer: Self-pay | Admitting: Nurse Practitioner

## 2020-05-27 ENCOUNTER — Ambulatory Visit (INDEPENDENT_AMBULATORY_CARE_PROVIDER_SITE_OTHER): Payer: Medicare HMO | Admitting: Internal Medicine

## 2020-05-27 DIAGNOSIS — I132 Hypertensive heart and chronic kidney disease with heart failure and with stage 5 chronic kidney disease, or end stage renal disease: Secondary | ICD-10-CM | POA: Diagnosis present

## 2020-05-27 DIAGNOSIS — A4189 Other specified sepsis: Secondary | ICD-10-CM | POA: Diagnosis not present

## 2020-05-27 DIAGNOSIS — E1165 Type 2 diabetes mellitus with hyperglycemia: Secondary | ICD-10-CM | POA: Diagnosis not present

## 2020-05-27 DIAGNOSIS — R6521 Severe sepsis with septic shock: Secondary | ICD-10-CM | POA: Diagnosis not present

## 2020-05-27 DIAGNOSIS — G9341 Metabolic encephalopathy: Secondary | ICD-10-CM | POA: Diagnosis present

## 2020-05-27 DIAGNOSIS — Z86711 Personal history of pulmonary embolism: Secondary | ICD-10-CM

## 2020-05-27 DIAGNOSIS — Z515 Encounter for palliative care: Secondary | ICD-10-CM

## 2020-05-27 DIAGNOSIS — A419 Sepsis, unspecified organism: Secondary | ICD-10-CM | POA: Diagnosis not present

## 2020-05-27 DIAGNOSIS — R0902 Hypoxemia: Secondary | ICD-10-CM | POA: Diagnosis not present

## 2020-05-27 DIAGNOSIS — E1151 Type 2 diabetes mellitus with diabetic peripheral angiopathy without gangrene: Secondary | ICD-10-CM | POA: Diagnosis present

## 2020-05-27 DIAGNOSIS — E11649 Type 2 diabetes mellitus with hypoglycemia without coma: Secondary | ICD-10-CM | POA: Diagnosis not present

## 2020-05-27 DIAGNOSIS — E1122 Type 2 diabetes mellitus with diabetic chronic kidney disease: Secondary | ICD-10-CM | POA: Diagnosis present

## 2020-05-27 DIAGNOSIS — G4733 Obstructive sleep apnea (adult) (pediatric): Secondary | ICD-10-CM | POA: Diagnosis present

## 2020-05-27 DIAGNOSIS — Z8673 Personal history of transient ischemic attack (TIA), and cerebral infarction without residual deficits: Secondary | ICD-10-CM

## 2020-05-27 DIAGNOSIS — E1129 Type 2 diabetes mellitus with other diabetic kidney complication: Secondary | ICD-10-CM | POA: Diagnosis not present

## 2020-05-27 DIAGNOSIS — J9600 Acute respiratory failure, unspecified whether with hypoxia or hypercapnia: Secondary | ICD-10-CM | POA: Diagnosis not present

## 2020-05-27 DIAGNOSIS — Z992 Dependence on renal dialysis: Secondary | ICD-10-CM

## 2020-05-27 DIAGNOSIS — I517 Cardiomegaly: Secondary | ICD-10-CM | POA: Diagnosis not present

## 2020-05-27 DIAGNOSIS — A4151 Sepsis due to Escherichia coli [E. coli]: Secondary | ICD-10-CM | POA: Diagnosis present

## 2020-05-27 DIAGNOSIS — D631 Anemia in chronic kidney disease: Secondary | ICD-10-CM | POA: Diagnosis present

## 2020-05-27 DIAGNOSIS — J80 Acute respiratory distress syndrome: Secondary | ICD-10-CM | POA: Diagnosis not present

## 2020-05-27 DIAGNOSIS — I4891 Unspecified atrial fibrillation: Secondary | ICD-10-CM | POA: Diagnosis present

## 2020-05-27 DIAGNOSIS — E875 Hyperkalemia: Secondary | ICD-10-CM | POA: Diagnosis present

## 2020-05-27 DIAGNOSIS — Z66 Do not resuscitate: Secondary | ICD-10-CM | POA: Diagnosis not present

## 2020-05-27 DIAGNOSIS — E785 Hyperlipidemia, unspecified: Secondary | ICD-10-CM | POA: Diagnosis present

## 2020-05-27 DIAGNOSIS — N186 End stage renal disease: Secondary | ICD-10-CM | POA: Diagnosis not present

## 2020-05-27 DIAGNOSIS — J9601 Acute respiratory failure with hypoxia: Secondary | ICD-10-CM | POA: Diagnosis not present

## 2020-05-27 DIAGNOSIS — L8990 Pressure ulcer of unspecified site, unspecified stage: Secondary | ICD-10-CM | POA: Insufficient documentation

## 2020-05-27 DIAGNOSIS — J189 Pneumonia, unspecified organism: Secondary | ICD-10-CM | POA: Diagnosis not present

## 2020-05-27 DIAGNOSIS — E039 Hypothyroidism, unspecified: Secondary | ICD-10-CM | POA: Diagnosis present

## 2020-05-27 DIAGNOSIS — U071 COVID-19: Secondary | ICD-10-CM | POA: Diagnosis present

## 2020-05-27 DIAGNOSIS — R0602 Shortness of breath: Secondary | ICD-10-CM | POA: Diagnosis not present

## 2020-05-27 DIAGNOSIS — J9602 Acute respiratory failure with hypercapnia: Secondary | ICD-10-CM | POA: Diagnosis not present

## 2020-05-27 DIAGNOSIS — I509 Heart failure, unspecified: Secondary | ICD-10-CM | POA: Diagnosis not present

## 2020-05-27 DIAGNOSIS — I429 Cardiomyopathy, unspecified: Secondary | ICD-10-CM | POA: Diagnosis present

## 2020-05-27 DIAGNOSIS — Z743 Need for continuous supervision: Secondary | ICD-10-CM | POA: Diagnosis not present

## 2020-05-27 DIAGNOSIS — J9690 Respiratory failure, unspecified, unspecified whether with hypoxia or hypercapnia: Secondary | ICD-10-CM | POA: Diagnosis not present

## 2020-05-27 DIAGNOSIS — Z825 Family history of asthma and other chronic lower respiratory diseases: Secondary | ICD-10-CM

## 2020-05-27 DIAGNOSIS — E874 Mixed disorder of acid-base balance: Secondary | ICD-10-CM | POA: Diagnosis not present

## 2020-05-27 DIAGNOSIS — T380X5A Adverse effect of glucocorticoids and synthetic analogues, initial encounter: Secondary | ICD-10-CM | POA: Diagnosis not present

## 2020-05-27 DIAGNOSIS — I5042 Chronic combined systolic (congestive) and diastolic (congestive) heart failure: Secondary | ICD-10-CM | POA: Diagnosis present

## 2020-05-27 DIAGNOSIS — N2581 Secondary hyperparathyroidism of renal origin: Secondary | ICD-10-CM | POA: Diagnosis present

## 2020-05-27 DIAGNOSIS — J1282 Pneumonia due to coronavirus disease 2019: Secondary | ICD-10-CM | POA: Diagnosis not present

## 2020-05-27 DIAGNOSIS — Z4682 Encounter for fitting and adjustment of non-vascular catheter: Secondary | ICD-10-CM | POA: Diagnosis not present

## 2020-05-27 DIAGNOSIS — Z4659 Encounter for fitting and adjustment of other gastrointestinal appliance and device: Secondary | ICD-10-CM

## 2020-05-27 DIAGNOSIS — J90 Pleural effusion, not elsewhere classified: Secondary | ICD-10-CM | POA: Diagnosis not present

## 2020-05-27 DIAGNOSIS — Z452 Encounter for adjustment and management of vascular access device: Secondary | ICD-10-CM | POA: Diagnosis not present

## 2020-05-27 LAB — COMPREHENSIVE METABOLIC PANEL
ALT: 39 U/L (ref 0–44)
AST: 86 U/L — ABNORMAL HIGH (ref 15–41)
Albumin: 3.2 g/dL — ABNORMAL LOW (ref 3.5–5.0)
Alkaline Phosphatase: 114 U/L (ref 38–126)
Anion gap: 22 — ABNORMAL HIGH (ref 5–15)
BUN: 77 mg/dL — ABNORMAL HIGH (ref 8–23)
CO2: 21 mmol/L — ABNORMAL LOW (ref 22–32)
Calcium: 9.1 mg/dL (ref 8.9–10.3)
Chloride: 94 mmol/L — ABNORMAL LOW (ref 98–111)
Creatinine, Ser: 7.78 mg/dL — ABNORMAL HIGH (ref 0.61–1.24)
GFR, Estimated: 7 mL/min — ABNORMAL LOW (ref >60)
Glucose, Bld: 96 mg/dL (ref 70–99)
Potassium: 5.2 mmol/L — ABNORMAL HIGH (ref 3.5–5.1)
Sodium: 137 mmol/L (ref 135–145)
Total Bilirubin: 1 mg/dL (ref 0.3–1.2)
Total Protein: 7.5 g/dL (ref 6.5–8.1)

## 2020-05-27 LAB — APTT: aPTT: 31 seconds (ref 24–36)

## 2020-05-27 LAB — CBC WITH DIFFERENTIAL/PLATELET
Band Neutrophils: 1 %
Basophils Absolute: 0 10*3/uL (ref 0.0–0.1)
Basophils Relative: 0 %
Eosinophils Absolute: 0 10*3/uL (ref 0.0–0.5)
Eosinophils Relative: 0 %
HCT: 40.9 % (ref 39.0–52.0)
Hemoglobin: 12.5 g/dL — ABNORMAL LOW (ref 13.0–17.0)
Lymphocytes Relative: 11 %
Lymphs Abs: 0.4 10*3/uL — ABNORMAL LOW (ref 0.7–4.0)
MCH: 34.1 pg — ABNORMAL HIGH (ref 26.0–34.0)
MCHC: 30.6 g/dL (ref 30.0–36.0)
MCV: 111.4 fL — ABNORMAL HIGH (ref 80.0–100.0)
Metamyelocytes Relative: 9 %
Monocytes Absolute: 0 10*3/uL — ABNORMAL LOW (ref 0.1–1.0)
Monocytes Relative: 0 %
Myelocytes: 2 %
Neutro Abs: 2.8 10*3/uL (ref 1.7–7.7)
Neutrophils Relative %: 76 %
Platelets: 170 10*3/uL (ref 150–400)
Promyelocytes Relative: 1 %
RBC: 3.67 MIL/uL — ABNORMAL LOW (ref 4.22–5.81)
RDW: 14.6 % (ref 11.5–15.5)
WBC: 3.7 10*3/uL — ABNORMAL LOW (ref 4.0–10.5)
nRBC: 0 % (ref 0.0–0.2)

## 2020-05-27 LAB — BLOOD GAS, ARTERIAL
Acid-base deficit: 7.7 mmol/L — ABNORMAL HIGH (ref 0.0–2.0)
Bicarbonate: 17.4 mmol/L — ABNORMAL LOW (ref 20.0–28.0)
FIO2: 80
O2 Saturation: 84.1 %
Patient temperature: 39.7
pCO2 arterial: 53.4 mmHg — ABNORMAL HIGH (ref 32.0–48.0)
pH, Arterial: 7.186 — CL (ref 7.350–7.450)
pO2, Arterial: 80.6 mmHg — ABNORMAL LOW (ref 83.0–108.0)

## 2020-05-27 LAB — HEMOGLOBIN A1C
Hgb A1c MFr Bld: 4.8 % (ref 4.8–5.6)
Mean Plasma Glucose: 91.06 mg/dL

## 2020-05-27 LAB — LACTIC ACID, PLASMA
Lactic Acid, Venous: 8.7 mmol/L (ref 0.5–1.9)
Lactic Acid, Venous: 5.8 mmol/L (ref 0.5–1.9)
Lactic Acid, Venous: 7.5 mmol/L (ref 0.5–1.9)

## 2020-05-27 LAB — HEPARIN LEVEL (UNFRACTIONATED): Heparin Unfractionated: 1.46 IU/mL — ABNORMAL HIGH (ref 0.30–0.70)

## 2020-05-27 LAB — PROTIME-INR
INR: 1.1 (ref 0.8–1.2)
Prothrombin Time: 14.1 seconds (ref 11.4–15.2)

## 2020-05-27 LAB — BRAIN NATRIURETIC PEPTIDE: B Natriuretic Peptide: >4500 pg/mL — ABNORMAL HIGH (ref 0.0–100.0)

## 2020-05-27 LAB — I-STAT ARTERIAL BLOOD GAS, ED
Acid-base deficit: 15 mmol/L — ABNORMAL HIGH (ref 0.0–2.0)
Bicarbonate: 15.7 mmol/L — ABNORMAL LOW (ref 20.0–28.0)
Calcium, Ion: 1.02 mmol/L — ABNORMAL LOW (ref 1.15–1.40)
HCT: 40 % (ref 39.0–52.0)
Hemoglobin: 13.6 g/dL (ref 13.0–17.0)
O2 Saturation: 92 %
Patient temperature: 97.6
Potassium: 4.9 mmol/L (ref 3.5–5.1)
Sodium: 135 mmol/L (ref 135–145)
TCO2: 17 mmol/L — ABNORMAL LOW (ref 22–32)
pCO2 arterial: 56.9 mmHg — ABNORMAL HIGH (ref 32.0–48.0)
pH, Arterial: 7.046 — CL (ref 7.350–7.450)
pO2, Arterial: 88 mmHg (ref 83.0–108.0)

## 2020-05-27 LAB — PHOSPHORUS: Phosphorus: 10.6 mg/dL — ABNORMAL HIGH (ref 2.5–4.6)

## 2020-05-27 LAB — CBG MONITORING, ED
Glucose-Capillary: 93 mg/dL (ref 70–99)
Glucose-Capillary: 72 mg/dL (ref 70–99)
Glucose-Capillary: 14 mg/dL — CL (ref 70–99)
Glucose-Capillary: 84 mg/dL (ref 70–99)

## 2020-05-27 LAB — MAGNESIUM: Magnesium: 2.3 mg/dL (ref 1.7–2.4)

## 2020-05-27 MED ORDER — VANCOMYCIN HCL IN DEXTROSE 1-5 GM/200ML-% IV SOLN
1000.0000 mg | Freq: Once | INTRAVENOUS | Status: AC
  Administered 2020-05-27: 1000 mg via INTRAVENOUS
  Filled 2020-05-27: qty 200

## 2020-05-27 MED ORDER — VANCOMYCIN HCL 500 MG/100ML IV SOLN
500.0000 mg | Freq: Once | INTRAVENOUS | Status: AC
  Administered 2020-05-27: 500 mg via INTRAVENOUS
  Filled 2020-05-27: qty 100

## 2020-05-27 MED ORDER — PHENYLEPHRINE CONCENTRATED 100MG/250ML (0.4 MG/ML) INFUSION SIMPLE
25.0000 ug/min | INTRAVENOUS | Status: DC
  Administered 2020-05-27: 25 ug/min via INTRAVENOUS
  Administered 2020-05-28: 85 ug/min via INTRAVENOUS
  Filled 2020-05-27 (×4): qty 250

## 2020-05-27 MED ORDER — STERILE WATER FOR INJECTION IV SOLN
INTRAVENOUS | Status: DC
  Filled 2020-05-27 (×8): qty 850

## 2020-05-27 MED ORDER — FENTANYL BOLUS VIA INFUSION
25.0000 ug | INTRAVENOUS | Status: DC | PRN
  Filled 2020-05-27: qty 25

## 2020-05-27 MED ORDER — SODIUM CHLORIDE 0.9 % IV BOLUS
1000.0000 mL | Freq: Once | INTRAVENOUS | Status: AC
  Administered 2020-05-27: 1000 mL via INTRAVENOUS

## 2020-05-27 MED ORDER — CALCITRIOL 0.25 MCG PO CAPS
0.2500 ug | ORAL_CAPSULE | Freq: Every day | ORAL | Status: DC
  Filled 2020-05-27: qty 1

## 2020-05-27 MED ORDER — NOREPINEPHRINE 4 MG/250ML-% IV SOLN
0.0000 ug/min | INTRAVENOUS | Status: DC
  Administered 2020-05-27: 2 ug/min via INTRAVENOUS
  Administered 2020-05-28: 20 ug/min via INTRAVENOUS
  Administered 2020-05-28: 19 ug/min via INTRAVENOUS
  Administered 2020-05-28: 40 ug/min via INTRAVENOUS
  Administered 2020-05-28: 25 ug/min via INTRAVENOUS
  Administered 2020-05-28: 19 ug/min via INTRAVENOUS
  Administered 2020-05-28: 20 ug/min via INTRAVENOUS
  Filled 2020-05-27 (×6): qty 250

## 2020-05-27 MED ORDER — MIDODRINE HCL 5 MG PO TABS
5.0000 mg | ORAL_TABLET | Freq: Three times a day (TID) | ORAL | Status: DC
  Administered 2020-05-28: 5 mg via ORAL
  Filled 2020-05-27: qty 1

## 2020-05-27 MED ORDER — FENTANYL 2500MCG IN NS 250ML (10MCG/ML) PREMIX INFUSION
25.0000 ug/h | INTRAVENOUS | Status: DC
  Administered 2020-05-27: 25 ug/h via INTRAVENOUS
  Filled 2020-05-27: qty 250

## 2020-05-27 MED ORDER — SODIUM CHLORIDE 0.9 % IV SOLN: 250.0000 mL | INTRAVENOUS | Status: DC

## 2020-05-27 MED ORDER — TOCILIZUMAB 400 MG/20ML IV SOLN
8.0000 mg/kg | Freq: Once | INTRAVENOUS | Status: AC
  Administered 2020-05-27: 580 mg via INTRAVENOUS
  Filled 2020-05-27: qty 29

## 2020-05-27 MED ORDER — ACETAMINOPHEN 650 MG RE SUPP: 650.0000 mg | Freq: Four times a day (QID) | RECTAL | Status: DC | PRN

## 2020-05-27 MED ORDER — BARICITINIB 2 MG PO TABS: 4.0000 mg | ORAL_TABLET | Freq: Every day | ORAL | Status: DC

## 2020-05-27 MED ORDER — ROCURONIUM BROMIDE 50 MG/5ML IV SOLN
INTRAVENOUS | Status: AC | PRN
Start: 2020-05-27 — End: 2020-05-27
  Administered 2020-05-27: 30 mg via INTRAVENOUS

## 2020-05-27 MED ORDER — THIAMINE HCL 100 MG PO TABS
100.0000 mg | ORAL_TABLET | Freq: Every day | ORAL | Status: DC
  Filled 2020-05-27: qty 1

## 2020-05-27 MED ORDER — ADULT MULTIVITAMIN W/MINERALS CH
1.0000 | ORAL_TABLET | Freq: Every day | ORAL | Status: DC
  Filled 2020-05-27: qty 1

## 2020-05-27 MED ORDER — ATORVASTATIN CALCIUM 10 MG PO TABS
40.0000 mg | ORAL_TABLET | Freq: Every day | ORAL | Status: DC
  Filled 2020-05-27: qty 1

## 2020-05-27 MED ORDER — HEPARIN (PORCINE) 25000 UT/250ML-% IV SOLN
1100.0000 [IU]/h | INTRAVENOUS | Status: DC
  Administered 2020-05-27: 1100 [IU]/h via INTRAVENOUS
  Filled 2020-05-27: qty 250

## 2020-05-27 MED ORDER — FENTANYL CITRATE (PF) 100 MCG/2ML IJ SOLN
25.0000 ug | Freq: Once | INTRAMUSCULAR | Status: AC
  Filled 2020-05-27: qty 2

## 2020-05-27 MED ORDER — SODIUM CHLORIDE 0.9 % IV SOLN
1.0000 g | INTRAVENOUS | Status: DC
  Filled 2020-05-27: qty 1

## 2020-05-27 MED ORDER — INSULIN ASPART 100 UNIT/ML ~~LOC~~ SOLN: 0.0000 [IU] | SUBCUTANEOUS | Status: DC

## 2020-05-27 MED ORDER — FENTANYL CITRATE (PF) 100 MCG/2ML IJ SOLN
INTRAMUSCULAR | Status: AC | PRN
  Administered 2020-05-27: 100 ug via INTRAVENOUS

## 2020-05-27 MED ORDER — ETOMIDATE 2 MG/ML IV SOLN
INTRAVENOUS | Status: AC | PRN
Start: 2020-05-27 — End: 2020-05-27
  Administered 2020-05-27: 10 mg via INTRAVENOUS

## 2020-05-27 MED ORDER — DEXTROSE 50 % IV SOLN: 1.0000 | Freq: Once | INTRAVENOUS | Status: AC

## 2020-05-27 MED ORDER — DOCUSATE SODIUM 50 MG/5ML PO LIQD
100.0000 mg | Freq: Two times a day (BID) | ORAL | Status: DC
  Administered 2020-05-28: 100 mg
  Filled 2020-05-27 (×3): qty 10

## 2020-05-27 MED ORDER — PHENYLEPHRINE HCL-NACL 10-0.9 MG/250ML-% IV SOLN
25.0000 ug/min | INTRAVENOUS | Status: DC
  Administered 2020-05-27: 25 ug/min via INTRAVENOUS
  Administered 2020-05-27: 170 ug/min via INTRAVENOUS
  Filled 2020-05-27 (×5): qty 250

## 2020-05-27 MED ORDER — DEXAMETHASONE SODIUM PHOSPHATE 10 MG/ML IJ SOLN
6.0000 mg | INTRAMUSCULAR | Status: DC
  Administered 2020-05-27 – 2020-05-28 (×2): 6 mg via INTRAVENOUS
  Filled 2020-05-27 (×2): qty 1

## 2020-05-27 MED ORDER — POLYETHYLENE GLYCOL 3350 17 G PO PACK: 17.0000 g | PACK | Freq: Every day | ORAL | Status: DC

## 2020-05-27 MED ORDER — SODIUM BICARBONATE 8.4 % IV SOLN
100.0000 meq | Freq: Once | INTRAVENOUS | Status: AC
  Administered 2020-05-28: 100 meq via INTRAVENOUS
  Filled 2020-05-27: qty 100

## 2020-05-27 MED ORDER — SODIUM CHLORIDE 0.9 % IV SOLN: INTRAVENOUS | Status: DC

## 2020-05-27 MED ORDER — VANCOMYCIN HCL 750 MG/150ML IV SOLN: 750.0000 mg | INTRAVENOUS | Status: DC

## 2020-05-27 MED ORDER — FOLIC ACID 1 MG PO TABS
1.0000 mg | ORAL_TABLET | Freq: Every day | ORAL | Status: DC
Start: 2020-05-27 — End: 2020-05-28
  Filled 2020-05-27: qty 1

## 2020-05-27 MED ORDER — PROSOURCE TF PO LIQD
45.0000 mL | Freq: Two times a day (BID) | ORAL | Status: DC
  Administered 2020-05-28 (×2): 45 mL
  Filled 2020-05-27 (×3): qty 45

## 2020-05-27 MED ORDER — FENTANYL CITRATE (PF) 100 MCG/2ML IJ SOLN
INTRAMUSCULAR | Status: AC
  Administered 2020-05-28: 25 ug via INTRAVENOUS
  Filled 2020-05-27: qty 2

## 2020-05-27 MED ORDER — LACTATED RINGERS IV BOLUS
500.0000 mL | Freq: Once | INTRAVENOUS | Status: AC
  Administered 2020-05-27: 500 mL via INTRAVENOUS

## 2020-05-27 MED ORDER — ACETAMINOPHEN 500 MG PO TABS
1000.0000 mg | ORAL_TABLET | Freq: Once | ORAL | Status: AC
  Administered 2020-05-27: 1000 mg via ORAL
  Filled 2020-05-27: qty 2

## 2020-05-27 MED ORDER — CALCIUM ACETATE (PHOS BINDER) 667 MG PO CAPS
667.0000 mg | ORAL_CAPSULE | Freq: Three times a day (TID) | ORAL | Status: DC
  Filled 2020-05-27 (×2): qty 1

## 2020-05-27 MED ORDER — SODIUM CHLORIDE 0.9 % IV SOLN: Freq: Once | INTRAVENOUS | Status: AC

## 2020-05-27 MED ORDER — VITAL HIGH PROTEIN PO LIQD
1000.0000 mL | ORAL | Status: DC
  Administered 2020-05-28: 1000 mL
  Filled 2020-05-27: qty 1000

## 2020-05-27 MED ORDER — ACETAMINOPHEN 325 MG PO TABS: 650.0000 mg | ORAL_TABLET | Freq: Four times a day (QID) | ORAL | Status: DC | PRN

## 2020-05-27 MED ORDER — DEXTROSE 50 % IV SOLN
INTRAVENOUS | Status: AC
  Administered 2020-05-27: 50 mL via INTRAVENOUS
  Filled 2020-05-27: qty 50

## 2020-05-27 MED ORDER — SODIUM CHLORIDE 0.9 % IV SOLN
2.0000 g | Freq: Once | INTRAVENOUS | Status: AC
  Administered 2020-05-27: 2 g via INTRAVENOUS
  Filled 2020-05-27: qty 2

## 2020-05-27 MED ORDER — THYROID 30 MG PO TABS
90.0000 mg | ORAL_TABLET | Freq: Every day | ORAL | Status: DC
  Filled 2020-05-27 (×2): qty 3

## 2020-05-27 MED ORDER — DEXMEDETOMIDINE HCL IN NACL 400 MCG/100ML IV SOLN
0.0000 ug/kg/h | INTRAVENOUS | Status: DC
  Administered 2020-05-27: 0.4 ug/kg/h via INTRAVENOUS
  Administered 2020-05-28: 0.6 ug/kg/h via INTRAVENOUS
  Administered 2020-05-28: 0.7 ug/kg/h via INTRAVENOUS
  Administered 2020-05-29: 0.5 ug/kg/h via INTRAVENOUS
  Filled 2020-05-27 (×4): qty 100

## 2020-05-27 MED ORDER — SODIUM ZIRCONIUM CYCLOSILICATE 5 G PO PACK
5.0000 g | PACK | Freq: Once | ORAL | Status: AC
  Administered 2020-05-28: 5 g
  Filled 2020-05-27: qty 1

## 2020-05-27 MED ORDER — MIDAZOLAM HCL 2 MG/2ML IJ SOLN
INTRAMUSCULAR | Status: AC
  Administered 2020-05-27: 1 mg
  Filled 2020-05-27: qty 2

## 2020-05-27 NOTE — ED Triage Notes (Signed)
Pt to the ED c/o Covid symptoms. Pt was brought into triage and found to have blue lips and nail beds with oxygen saturations in the low 70's.

## 2020-05-27 NOTE — ED Notes (Signed)
Dr.Olalere at bedside for central line placement for this pt.

## 2020-05-27 NOTE — ED Notes (Signed)
Pt repositioned in bed at request.

## 2020-05-27 NOTE — ED Notes (Addendum)
I assumed care of this patient at this time. At the time of assuming care, patient arrives on 15L non-rebreather, cyanotic to lips and fingers.  Bed is locked in the lowest position, side rails x2, call bell within reach.Cardiac monitor in place with vital signs cycling q30 minutes. Pt has shallow respirations with increase in work of breathing and subclavicular retrations. Pt is alert and oriented, and answers questions appropriately.  Dr. Roderic Palau to bedside for assessment and staff remains at bedside.

## 2020-05-27 NOTE — Progress Notes (Addendum)
ANTICOAGULATION CONSULT NOTE - Initial Consult  Pharmacy Consult for heparin Indication: atrial fibrillation/ hx of PE in 2020  No Known Allergies  Patient Measurements: Height: 6' (182.9 cm) Weight: 72.6 kg (160 lb) IBW/kg (Calculated) : 77.6 Heparin Dosing Weight: 72.6 kg  Vital Signs: Temp: 102.2 F (39 C) (01/05 1530) Temp Source: Core (Comment) (01/05 1430) BP: 91/67 (01/05 1530) Pulse Rate: 156 (01/05 1410)  Labs: Recent Labs    06/22/2020 1252  HGB 12.5*  HCT 40.9  PLT 170  APTT 31  LABPROT 14.1  INR 1.1  CREATININE 7.78*    Estimated Creatinine Clearance: 9.2 mL/min (A) (by C-G formula based on SCr of 7.78 mg/dL (H)).   Medical History: Past Medical History:  Diagnosis Date  . Atrial fibrillation ([place])   . Chronic systolic CHF (congestive heart failure) ([place])   . Congestive heart failure (CHF) ([place])   . DM type 2 (diabetes mellitus, type 2) ([place])   . ESRD on hemodialysis ([place])   . Essential hypertension   . Headache   . Hyperlipidemia   . Hypothyroidism   . Lymphedema   . OSA on CPAP   . PE (pulmonary thromboembolism) ([place])    High probability for PE on VQ scan 01/29/19  . PVD (peripheral vascular disease) ([place])   . Secondary cardiomyopathy ([place])   . Stroke ([place])   . Vitamin D deficiency disease   . Wears glasses     Medications:  (Not in a hospital admission)   Assessment: Pharmacy consulted to dose heparin in patient with atrial fibrillation and history of PE in 2020.  Med rec still pending- current med rec does not list Eliquis.   Goal of Therapy:  aptt 66-102seconds  Heparin level 0.3-0.7 units/ml Monitor platelets by anticoagulation protocol: Yes   Plan:   1600 Update: Spoke with Caryl Pina in lab, HL too high was requiring dilution (decidedly from apixaban). Starting heparin infusion, no bolus due to unknown administration time of last apixaban 5mg  dose. Aptt monitoring will be used along with HL due to recent apixaban until correlation  established.   No bolus Start heparin infusion at 1100 units/hr Check aPTT & anti-Xa level in 8 hours and daily while on heparin Continue to monitor H&H and platelets  Thomasenia Sales, PharmD, MBA, BCGP Clinical Pharmacist  05/26/2020 3:47 PM

## 2020-05-27 NOTE — ED Notes (Signed)
LATE ENTRY FOR 5852:  Pt on EMS transport to receiving facility with this RN and Paramedic crew, Phenylephrine rate change to 12mcg.   LATE ENTRY FOR 1638: Most recent BP 63/45, Phenylephrine rate change to 108mcg.   LATE ENTRY FOR 1642:  BP 73/45 M56, heart rate sustaining at 150bpm with an A-Fib rhythm noted, RR 45-55 per/minute.  LATE ENTRY FOR 1647: Pt remains hypotensive, MAP values in 50-55. Phenylephrine increased to 31mcg.   LATE ENTRY FOR 1651:  Most recent BP 84/56, MAP 63. At this time, pt is able to speak with this RN and Paramedic staff, is alert and oriented, but appears sleepier than previous assessment.   LATE ENTRY FOR 1654:  BP 84/52 M63, HR 147, RR 56, SpO2 81% on 15L NRB.

## 2020-05-27 NOTE — ED Notes (Signed)
Dr. Roderic Palau made aware of patient's most recent set of vital signs. Currently awaiting a page back at this time.

## 2020-05-27 NOTE — H&P (Signed)
NAME:  Lawrence Bonilla, MRN:  [id number], DOB:  1951-03-13, LOS: 0 ADMISSION DATE:  06/07/2020, CONSULTATION DATE: 05/27/2020 REFERRING MD:  Dr. Roderic Palau, ER, CHIEF COMPLAINT:  Respiratory failure   Brief History:  70 yo male developed cough, fever and dyspnea on 05/22/20.  Presented to ER and tested positive for COVID 19 infection.  CXR at that time didn't show infiltrate and he was discharged home.  Returned to ER on 05/27/20 with worsening fever (103.30F), cough and dyspnea.  Found to have hypoxia (SpO2 70% on room air), hypotension, tachycardia, and A fib with RVR.  CXR showed progressive b/l infiltrates consistent with COVID 19 pneumonia.  PCCM asked to assess for admission.  Past Medical History:  ESRD on HD, Combined CHF, A fib on eliquis, PE in 2020, CVA, Vit D deficiency, OSA on CPAP, Lymphedema, Hypothyroidism, HLD, DM type 2  Significant Hospital Events:  1/05 Admit  Consults:    Procedures:    Significant Diagnostic Tests:    Micro Data:  COVID 05/22/20 >> Positive Flu 05/22/20 >> negative Blood 02/24/21 >>   COVID Therapy:  Decadron 1/05 >> Tocilizumab 1/05  Antimicrobials:  Vancomycin 1/05 Cefepime 1/05   Interim History / Subjective:    Objective   Blood pressure 100/60, pulse (!) 156, temperature (!) 102.7 F (39.3 C), temperature source Core (Comment), resp. rate (!) 41, height 6' (1.829 m), weight 72.6 kg, SpO2 96 %.        Intake/Output Summary (Last 24 hours) at 06/11/2020 1450 Last data filed at 06/11/2020 1349 Gross per 24 hour  Intake 100 ml  Output --  Net 100 ml   Filed Weights   06/13/2020 1227 05/26/2020 1315  Weight: 72.6 kg 72.6 kg    Examination:  General - seen in ER, alert Eyes - pupils reactive ENT - no sinus tenderness, no stridor Cardiac - irregular, tachycardic Chest - b/l rhonchi Abdomen - soft, non tender, + bowel sounds Extremities - 1+ lower leg edema, AV graft Lt arm Skin - no rashes Neuro - follows commands, moves  extremities, mildly confused   Resolved Hospital Problem list     Assessment & Plan:   Acute hypoxic respiratory failure from COVID 19 pneumonia. - start decadron 1/05 - tocilizumab x one on 1/05 - don't think he has bacterial superinfection >> hold additional Abx for now - goal SpO2 85 to 95% - need monitoring in ICU for intubation need - f/u CXR, ABG  Septic shock with lactic acidosis. - fluid bolus being given in ER - phenylephrine for MAP goal > 65 - f/u lactic acid level  A fib with RVR with history of atrial fibrillation present prior to admission. Chronic combined CHF >> Echo from 04/23/20 with EF 50%. HLD. - heparin gtt; hold outpt eliquis for now - monitor on telemetry - continue lipitor - hold outpt lasix, toprol xl  ESRD on HD. Mild hyperkalemia. - concerned he will need CRRT in setting of acute critical illness - will need to consult nephrology once he is transferred to [facility]  Steroid induced hyperglycemia. - SSI  Hx of hypothyroidism. - last TSH 6.24 from 05/05/20 - continue thyroid replacement therapy  Acute metabolic encephalopathy from sepsis, hypoxia. - mild confusion at this time - monitor mental status  Best practice (evaluated daily)  Diet: Sips with meds Pain/Anxiety/Delirium protocol (if indicated): N/A VAP protocol (if indicated): N/A DVT prophylaxis: Heparin gtt GI prophylaxis: N/A Glucose control: SSI Mobility: Bed rest Disposition: Admit to [facility]  ICU (either 23m or 71m)  Goals of Care:  Last date of multidisciplinary goals of care discussion: Pending Family and staff present: Pending Summary of discussion: Pending Follow up goals of care discussion due: Pending Code Status: Full code  Attempted to contact patient's family at listed number, but no answer.  Labs   CBC: Recent Labs  Lab 05/22/20 0409 06/17/2020 1252  WBC 3.3* 3.7*  NEUTROABS 2.1 2.8  HGB 10.3* 12.5*  HCT 33.1* 40.9  MCV 110.7* 111.4*  PLT  166 833    Basic Metabolic Panel: Recent Labs  Lab 05/22/20 0409 05/23/2020 1252  NA 136 137  K 4.9 5.2*  CL 96* 94*  CO2 24 21*  GLUCOSE 92 96  BUN 64* 77*  CREATININE 6.40* 7.78*  CALCIUM 8.8* 9.1   GFR: Estimated Creatinine Clearance: 9.2 mL/min (A) (by C-G formula based on SCr of 7.78 mg/dL (H)). Recent Labs  Lab 05/22/20 0409 06/10/2020 1252  WBC 3.3* 3.7*  LATICACIDVEN  --  8.7*    Liver Function Tests: Recent Labs  Lab 06/10/2020 1252  AST 86*  ALT 39  ALKPHOS 114  BILITOT 1.0  PROT 7.5  ALBUMIN 3.2*   No results for input(s): LIPASE, AMYLASE in the last 168 hours. No results for input(s): AMMONIA in the last 168 hours.  ABG    Component Value Date/Time   PHART 7.186 (LL) 05/30/2020 1317   PCO2ART 53.4 (H) 06/08/2020 1317   PO2ART 80.6 (L) 06/16/2020 1317   HCO3 17.4 (L) 05/25/2020 1317   TCO2 26 02/06/2020 0820   ACIDBASEDEF 7.7 (H) 06/07/2020 1317   O2SAT 84.1 06/22/2020 1317     Coagulation Profile: Recent Labs  Lab 06/13/2020 1252  INR 1.1    Cardiac Enzymes: No results for input(s): CKTOTAL, CKMB, CKMBINDEX, TROPONINI in the last 168 hours.  HbA1C: Hgb A1c MFr Bld  Date/Time Value Ref Range Status  02/19/2020 10:50 AM 5.1 <5.7 % of total Hgb Final    Comment:    For the purpose of screening for the presence of diabetes: . <5.7%       Consistent with the absence of diabetes 5.7-6.4%    Consistent with increased risk for diabetes             (prediabetes) > or =6.5%  Consistent with diabetes . This assay result is consistent with a decreased risk of diabetes. . Currently, no consensus exists regarding use of hemoglobin A1c for diagnosis of diabetes in children. . According to American Diabetes Association (ADA) guidelines, hemoglobin A1c <7.0% represents optimal control in non-pregnant diabetic patients. Different metrics may apply to specific patient populations.  Standards of Medical Care in Diabetes(ADA). Marland Kitchen   09/09/2019  11:50 AM 5.2 <5.7 % of total Hgb Final    Comment:    For the purpose of screening for the presence of diabetes: . <5.7%       Consistent with the absence of diabetes 5.7-6.4%    Consistent with increased risk for diabetes             (prediabetes) > or =6.5%  Consistent with diabetes . This assay result is consistent with a decreased risk of diabetes. . Currently, no consensus exists regarding use of hemoglobin A1c for diagnosis of diabetes in children. . According to American Diabetes Association (ADA) guidelines, hemoglobin A1c <7.0% represents optimal control in non-pregnant diabetic patients. Different metrics may apply to specific patient populations.  Standards of Medical Care in Diabetes(ADA). Marland Kitchen  CBG: No results for input(s): GLUCAP in the last 168 hours.  Review of Systems:   Reviewed and negative  Past Medical History:  He,  has a past medical history of Atrial fibrillation ([place]), Chronic systolic CHF (congestive heart failure) ([place]), Congestive heart failure (CHF) ([place]), DM type 2 (diabetes mellitus, type 2) ([place]), ESRD on hemodialysis ([place]), Essential hypertension, Headache, Hyperlipidemia, Hypothyroidism, Lymphedema, OSA on CPAP, PE (pulmonary thromboembolism) ([place]), PVD (peripheral vascular disease) ([place]), Secondary cardiomyopathy ([place]), Stroke ([place]), Vitamin D deficiency disease, and Wears glasses.   Surgical History:   Past Surgical History:  Procedure Laterality Date  . APPENDECTOMY    . AV FISTULA PLACEMENT Left 11/04/2019   Procedure: left radiocephalic ARTERIOVENOUS (AV) FISTULA CREATION;  Surgeon: Rosetta Posner, MD;  Location: [place];  Service: Vascular;  Laterality: Left;  . BARIATRIC SURGERY    . CATARACT EXTRACTION W/ INTRAOCULAR LENS  IMPLANT, BILATERAL    . HERNIA REPAIR    . LIGATION OF COMPETING BRANCHES OF ARTERIOVENOUS FISTULA Left 02/06/2020   Procedure: LIGATION OF COMPETING BRANCHES OF LEFT ARM ARTERIOVENOUS FISTULA;  Surgeon: Rosetta Posner, MD;  Location: [facility];  Service: Vascular;  Laterality: Left;  . RIGHT HEART CATH N/A 05/02/2019   Procedure: RIGHT HEART CATH;  Surgeon: Jolaine Artist, MD;  Location: [place] [facility];  Service: Cardiovascular;  Laterality: N/A;  . TONSILLECTOMY    . WISDOM TOOTH EXTRACTION       Social History:   reports that he has never smoked. He has never used smokeless tobacco. He reports current alcohol use. He reports that he does not use drugs.   Family History:  His family history includes Asthma in his mother.   Allergies No Known Allergies   Home Medications  Prior to Admission medications   Medication Sig Start Date End Date Taking? Authorizing Provider  acetaminophen (TYLENOL) 325 MG tablet Take 2 tablets (650 mg total) by mouth every 6 (six) hours as needed for mild pain (or Fever >/= 101). 04/14/20   Roxan Hockey, MD  atorvastatin (LIPITOR) 40 MG tablet Take 1 tablet (40 mg total) by mouth daily. 04/14/20   Roxan Hockey, MD  AURYXIA 1 GM 210 MG(Fe) tablet Take 420 mg by mouth 2 (two) times daily.  11/21/19   [provider]  calcitRIOL (ROCALTROL) 0.25 MCG capsule Take 0.25 mcg by mouth daily.    [provider]  calcium acetate (PHOSLO) 667 MG capsule Take 667 mg by mouth 3 (three) times daily. 04/08/20   [provider]  Calcium Carb-Cholecalciferol (CALCIUM 600 + D PO) Take 600 mg by mouth daily.     [provider]  fluticasone (FLONASE) 50 MCG/ACT nasal spray Place 1 spray into both nostrils daily. Patient not taking: Reported on 05/05/2020 02/19/20   Ailene Ards, NP  folic acid (FOLVITE) 1 MG tablet Take 1 tablet (1 mg total) by mouth daily. 04/15/20   Roxan Hockey, MD  furosemide (LASIX) 40 MG tablet Take 2 tablets (80 mg total) by mouth 2 (two) times daily. 01/16/20   Doree Albee, MD  metoprolol succinate (TOPROL-XL) 25 MG 24 hr tablet Take 1 tablet (25 mg total) by mouth daily. Take with or immediately following a  meal. 04/14/20   Emokpae, Courage, MD  midodrine (PROAMATINE) 5 MG tablet Take 1 tablet (5 mg total) by mouth 3 (three) times daily with meals. 04/14/20   Roxan Hockey, MD  Multiple Vitamin (MULTIVITAMIN WITH MINERALS) TABS tablet Take 1 tablet  by mouth daily. 04/15/20   Roxan Hockey, MD  NP THYROID 90 MG tablet Take 1 tablet (90 mg total) by mouth daily. 05/06/20   Doree Albee, MD  potassium chloride (KLOR-CON) 10 MEQ tablet Take 1 tablet (10 mEq total) by mouth daily. 02/18/20   Hurshel Party C, MD  potassium chloride (MICRO-K) 10 MEQ CR capsule TAKE 1 CAPSULE(10 MEQ) BY MOUTH DAILY 05/18/20   Hurshel Party C, MD  thiamine 100 MG tablet Take 1 tablet (100 mg total) by mouth daily. 04/15/20   Roxan Hockey, MD     Critical care time: 48 minutes  Chesley Mires, MD [place] Pager - [phone] 06/06/2020, 3:08 PM

## 2020-05-27 NOTE — Progress Notes (Signed)
Lawrence Bonilla was admitted to critical care medicine at [facility] and transferred via EMS to [facility].  Covid positive, bilateral pneumonia.  Seen and examined in the ED.   Hypoxic with saturations in the 70s, heart rate in the 130s to 140s, A. fib with RVR. Tachypnea, minimal scalene accessory muscle use Sleepy, follows commands in all extremities, opens his eyes and give short verbal answers. Dialysis catheter without surrounding erythema on right chest. Lip cyanosis Hands cool Hypotensive with map in mid 50s when Neo-Synephrine discontinued.  Labs reviewed, notable for: Lactic acid 8.7 > 5.8 Potassium 5.2 Bicarb 21 BNP >4500 ABG    Component Value Date/Time   PHART 7.186 (LL) 05/26/2020 1317   PCO2ART 53.4 (H) 06/06/2020 1317   PO2ART 80.6 (L) 05/26/2020 1317   HCO3 17.4 (L) 05/26/2020 1317   TCO2 26 02/06/2020 0820   ACIDBASEDEF 7.7 (H) 05/24/2020 1317   O2SAT 84.1 05/26/2020 1317   Assessment & plan: Acute hypoxic and hypercapnic respiratory failure due to COVID-19 viral pneumonia, ARDS. -Trial BiPAP.  Patient saturations improved to the 80s.  Needs close follow-up.  Will have 19 reassess in about an hour.  Remains high risk for intubation.  Mental status is appropriate at this time for use of BiPAP. -Covid care per protocol-dexamethasone, tocilizumab, isolation  Septic shock- unknown source -Restart cefepime and vancomycin.  1blood cx drawn at [facility] & urine culture sent. -Follow-up lactate -Dialysis catheter does not appear to be infected -Pressors as required to maintain MAP greater than 65  ESRD, hyperkalemia, metabolic acidosis -Nephro consult in a.m.  A. fib with RVR History of OSA on CPAP -May need amiodarone, reassess once saturations improved on -telemetry -tx fever before aggressive HR control  Acute encephalopathy, still able to follow commands.  Likely due to sepsis. -Continue assessing. If mental status deteriorates there will no longer be a good  candidate for BiPAP. -treat sepsis   Patient discussed with night team who will con't to follow.  This patient is critically ill with multiple organ system failure which requires frequent high complexity decision making, assessment, support, evaluation, and titration of therapies. This was completed through the application of advanced monitoring technologies and extensive interpretation of multiple databases. During this encounter critical care time was devoted to patient care services described in this note for 45 minutes.   Julian Hy, DO 06/09/2020 7:03 PM [facility]

## 2020-05-27 NOTE — ED Provider Notes (Signed)
Dr. Halford Chessman saw the patient and feel like the patient need to be admitted immediately over [facility] because he needs continuous dialysis and may be put on ventilator.  There are no Covid ICU beds available right now at [facility] so I spoke with Dr. Darvin Neighbours and he said it was imperative that the patient be transferred to the ED down at [place] [facility].  Dr. Maryan Rued accepted the patient   Milton Ferguson, MD 06/20/2020 [phone]

## 2020-05-27 NOTE — Telephone Encounter (Signed)
Thank you for diligently trying to get him scheduled for a virtual visit. It appears he is currently in the ER and I believe he is going to be admitted. You can leave him on the schedule for tomorrow in case he does not get admitted, but he very well may be unavailable due to probable admission to the hospital.

## 2020-05-27 NOTE — ED Provider Notes (Signed)
70 year old male history of end-stage renal disease on dialysis transferred from [facility] for ICU evaluation.  Patient apparently diagnosed with COVID about 5 days ago.  Worsening respiratory status.  Has been hypotensive tachypneic and hypoxic.  Currently on a nonrebreather.  He is on phenylephrine IV.  Has received cefepime Vanco and IV fluids. Physical Exam  BP 91/67 (BP Location: Right Arm)   Pulse (!) 153   Temp (!) 102.2 F (39 C) (Core (Comment))   Resp (!) 42   Ht 6' (1.829 m)   Wt 72.6 kg   SpO2 96%   BMI 21.70 kg/m   Physical Exam Constitutional:      General: He is in acute distress.     Appearance: He is ill-appearing.  HENT:     Head: Normocephalic.  Cardiovascular:     Rate and Rhythm: Tachycardia present.  Pulmonary:     Effort: Respiratory distress present.     Breath sounds: Rhonchi present.     ED Course/Procedures     Procedures  MDM  Patient on arrival is tachypneic and tachycardic.  He is however awake and denies any pain.  ICU team paged.  ICU team has evaluated patient and are admitting to their service.       Hayden Rasmussen, MD 05/28/20 1056

## 2020-05-27 NOTE — Procedures (Signed)
Intubation Procedure Note  Lawrence Bonilla  [id number]  1950/07/21  Date:06/01/2020  Time:8:20 PM   Provider Performing:Hunner Garcon A Ikaika Showers    Procedure: Intubation (41740)  Indication(s) Respiratory Failure  Consent Unable to obtain consent due to emergent nature of procedure.   Anesthesia Etomidate, Versed, Fentanyl and Rocuronium  10 mg etomidate, 1 of Versed, 100 of fentanyl, 30 of rocuronium  Time Out Verified patient identification, verified procedure, site/side was marked, verified correct patient position, special equipment/implants available, medications/allergies/relevant history reviewed, required imaging and test results available.   Sterile Technique Usual hand hygeine, masks, and gloves were used   Procedure Description Patient positioned in bed supine.  Sedation given as noted above.  Patient was intubated with endotracheal tube using Glidescope.  View was Grade 1 full glottis .  Number of attempts was 1.  Colorimetric CO2 detector was consistent with tracheal placement.   Complications/Tolerance None; patient tolerated the procedure well. Chest X-ray is ordered to verify placement.   EBL none   Specimen(s) None

## 2020-05-27 NOTE — Progress Notes (Signed)
Pt's wife called back.  Spoke to her on phone, updated about current status, treatment plan, and plan to arrange for ER to ER transfer while awaiting ICU placement in [place], MD [place] Pager - [phone] 06/12/2020, 3:30 PM

## 2020-05-27 NOTE — ED Notes (Signed)
Versed 1mg  given

## 2020-05-27 NOTE — ED Notes (Signed)
CRITICAL VALUE ALERT  Critical Value:  ABG pH 7.188  Date & Time Notied:  06/11/2020 1339  Provider Notified: Dr. Roderic Palau  Orders Received/Actions taken: EDP notified

## 2020-05-27 NOTE — ED Notes (Signed)
Date and time results received: 06/22/2020 2300  Test: blood sugar (POC) Critical Value: 14  Name of Provider Notified: Ander Slade MD  Orders Received. Actions taken. D50 given 1 amp

## 2020-05-27 NOTE — ED Notes (Signed)
Pt placed in Bi-PAP

## 2020-05-27 NOTE — ED Notes (Signed)
Intensivist at bedside. Pt currently satting at 78% on bipap, plan to intubate. RT and pharmacist and lead charge aware of situation.

## 2020-05-27 NOTE — Progress Notes (Signed)
[facility] Progress Note Patient Name: Lawrence Bonilla DOB: [birth year] MRN: [id number]   Date of Service  06/22/2020  HPI/Events of Note  ABG on 100%/PRVC 30/TV 450/P 10 = 7.046/56.9/88.  eICU Interventions  Plan: 1. Increase PRVC rate to 35. 2. NaHCO3 100 meq IV now.  3. NaHCO3 IV infusion at 125 mL/hour.  4. Repeat ABG at 2:30 AM.     Intervention Category Major Interventions: Acid-Base disturbance - evaluation and management;Respiratory failure - evaluation and management  Lysle Dingwall 05/31/2020, 11:35 PM

## 2020-05-27 NOTE — ED Notes (Signed)
Xray aware pt is ready.

## 2020-05-27 NOTE — Procedures (Signed)
Central Venous Catheter Insertion Procedure Note  Lawrence Bonilla  [id number]  1950/11/04  Date:05/31/2020  Time:8:56 PM   Provider Performing:Rashia Mckesson A Ander Slade   Procedure: Insertion of Non-tunneled Central Venous Catheter(36556) with US guidance (07225)   Indication(s) Medication administration  Consent Unable to obtain consent due to emergent nature of procedure.  Anesthesia Topical only with 1% lidocaine   Timeout Verified patient identification, verified procedure, site/side was marked, verified correct patient position, special equipment/implants available, medications/allergies/relevant history reviewed, required imaging and test results available.  Sterile Technique Maximal sterile technique including full sterile barrier drape, hand hygiene, sterile gown, sterile gloves, mask, hair covering, sterile ultrasound probe cover (if used).  Procedure Description Area of catheter insertion was cleaned with chlorhexidine and draped in sterile fashion.  With real-time ultrasound guidance a central venous catheter was placed into the left internal jugular vein. Nonpulsatile blood flow and easy flushing noted in all ports.  The catheter was sutured in place and sterile dressing applied.  Complications/Tolerance None; patient tolerated the procedure well. Chest X-ray is ordered to verify placement for internal jugular or subclavian cannulation.   Chest x-ray is not ordered for femoral cannulation.  EBL Minimal  Specimen(s) None

## 2020-05-27 NOTE — Consult Note (Signed)
Reason for Consult: To manage dialysis and dialysis related needs  Referring Physician: Dr Jenelle Mages is an 70 y.o. male.   HPI: Pt is a 66M with a PMH sig for HTN, DM II, ESRD on HD MWF, CHF, Afib on Apixaban, and dx of COVID on 05/22/20.  Had some cough and SOB at that time, went to ED at [facility], was discharged.  Unfortunately symptoms worsened and presented back to [facility] with fever, cough, hypoxia, SOB.  Was evaluated by Dr Halford Chessman, transferred to [facility] for higher level of care.    Once at [facility], was found to have hypoxia requiring BiPaP, hypotension requiring phenylephrine, was in Afib with RVR.  Lactate 8.7--> 5.8.  K 5.2.  Missed dialysis today d/t being in the ED.  CXR reveals bilateral infiltrates with pulm edema as well.  He was initially tolerating BiPaP but starting to desat to the 80s with obtundation.  Plans to intubate shortly.    Dialyzes at [facility] MWF  3.5 hours, 2K bath, EDW 70.5 kg, no heparin bolus with treatment.   Past Medical History:  Diagnosis Date  . Atrial fibrillation ([place])   . Chronic systolic CHF (congestive heart failure) ([place])   . Congestive heart failure (CHF) ([place])   . DM type 2 (diabetes mellitus, type 2) ([place])   . ESRD on hemodialysis ([place])   . Essential hypertension   . Headache   . Hyperlipidemia   . Hypothyroidism   . Lymphedema   . OSA on CPAP   . PE (pulmonary thromboembolism) ([place])    High probability for PE on VQ scan 01/29/19  . PVD (peripheral vascular disease) ([place])   . Secondary cardiomyopathy ([place])   . Stroke ([place])   . Vitamin D deficiency disease   . Wears glasses     Past Surgical History:  Procedure Laterality Date  . APPENDECTOMY    . AV FISTULA PLACEMENT Left 11/04/2019   Procedure: left radiocephalic ARTERIOVENOUS (AV) FISTULA CREATION;  Surgeon: Rosetta Posner, MD;  Location: [facility];  Service: Vascular;  Laterality: Left;  . BARIATRIC SURGERY    . CATARACT EXTRACTION W/ INTRAOCULAR LENS  IMPLANT, BILATERAL    . HERNIA  REPAIR    . LIGATION OF COMPETING BRANCHES OF ARTERIOVENOUS FISTULA Left 02/06/2020   Procedure: LIGATION OF COMPETING BRANCHES OF LEFT ARM ARTERIOVENOUS FISTULA;  Surgeon: Rosetta Posner, MD;  Location: [facility];  Service: Vascular;  Laterality: Left;  . RIGHT HEART CATH N/A 05/02/2019   Procedure: RIGHT HEART CATH;  Surgeon: Jolaine Artist, MD;  Location: [facility];  Service: Cardiovascular;  Laterality: N/A;  . TONSILLECTOMY    . WISDOM TOOTH EXTRACTION      Family History  Problem Relation Age of Onset  . Asthma Mother     Social History:  reports that he has never smoked. He has never used smokeless tobacco. He reports current alcohol use. He reports that he does not use drugs.  Allergies: No Known Allergies  Medications:  Scheduled: . atorvastatin  40 mg Oral Daily  . calcitRIOL  0.25 mcg Oral Daily  . calcium acetate  667 mg Oral TID with meals  . dexamethasone (DECADRON) injection  6 mg Intravenous Q24H  . fentaNYL      . folic acid  1 mg Oral Daily  . insulin aspart  0-6 Units Subcutaneous Q4H  . midazolam      . midodrine  5 mg Oral TID WC  . multivitamin  with minerals  1 tablet Oral Daily  . thiamine  100 mg Oral Daily  . [START ON 05/28/2020] thyroid  90 mg Oral Daily     Results for orders placed or performed during the hospital encounter of 05/31/2020 (from the past 48 hour(s))  Blood culture (routine single)     Status: None (Preliminary result)   Collection Time: 06/11/2020 12:48 PM   Specimen: BLOOD RIGHT WRIST  Result Value Ref Range   Specimen Description BLOOD RIGHT WRIST DRAWN BY RN    Special Requests      BOTTLES DRAWN AEROBIC AND ANAEROBIC Blood Culture results may not be optimal due to an inadequate volume of blood received in culture bottles   Culture      NO GROWTH <12 HOURS Performed at [facility], [address], [place], [place] [address]    Report Status PENDING   Lactic acid, plasma     Status: Abnormal   Collection Time: 06/18/2020  12:52 PM  Result Value Ref Range   Lactic Acid, Venous 8.7 (HH) 0.5 - 1.9 mmol/L    Comment: CRITICAL RESULT CALLED TO, READ BACK BY AND VERIFIED WITH: [email or web address]  by [email or web address], b 1.5.22 Performed at [facility], [address], [place], [place] [address]   Comprehensive metabolic panel     Status: Abnormal   Collection Time: 06/10/2020 12:52 PM  Result Value Ref Range   Sodium 137 135 - 145 mmol/L   Potassium 5.2 (H) 3.5 - 5.1 mmol/L   Chloride 94 (L) 98 - 111 mmol/L   CO2 21 (L) 22 - 32 mmol/L   Glucose, Bld 96 70 - 99 mg/dL    Comment: Glucose reference range applies only to samples taken after fasting for at least 8 hours.   BUN 77 (H) 8 - 23 mg/dL   Creatinine, Ser 7.78 (H) 0.61 - 1.24 mg/dL   Calcium 9.1 8.9 - 10.3 mg/dL   Total Protein 7.5 6.5 - 8.1 g/dL   Albumin 3.2 (L) 3.5 - 5.0 g/dL   AST 86 (H) 15 - 41 U/L   ALT 39 0 - 44 U/L   Alkaline Phosphatase 114 38 - 126 U/L   Total Bilirubin 1.0 0.3 - 1.2 mg/dL   GFR, Estimated 7 (L) >60 mL/min    Comment: (NOTE) Calculated using the CKD-EPI Creatinine Equation (2021)    Anion gap 22 (H) 5 - 15    Comment: Performed at [facility], [address] [place], [place] [address]  CBC WITH DIFFERENTIAL     Status: Abnormal   Collection Time: 06/05/2020 12:52 PM  Result Value Ref Range   WBC 3.7 (L) 4.0 - 10.5 K/uL   RBC 3.67 (L) 4.22 - 5.81 MIL/uL   Hemoglobin 12.5 (L) 13.0 - 17.0 g/dL   HCT 40.9 39.0 - 52.0 %   MCV 111.4 (H) 80.0 - 100.0 fL   MCH 34.1 (H) 26.0 - 34.0 pg   MCHC 30.6 30.0 - 36.0 g/dL   RDW 14.6 11.5 - 15.5 %   Platelets 170 150 - 400 K/uL   nRBC 0.0 0.0 - 0.2 %   Neutrophils Relative % 76 %   Neutro Abs 2.8 1.7 - 7.7 K/uL   Band Neutrophils 1 %   Lymphocytes Relative 11 %   Lymphs Abs 0.4 (L) 0.7 - 4.0 K/uL   Monocytes Relative 0 %   Monocytes Absolute 0.0 (L) 0.1 - 1.0 K/uL   Eosinophils Relative 0 %   Eosinophils Absolute 0.0 0.0 -  0.5 K/uL   Basophils Relative 0 %   Basophils Absolute 0.0 0.0 - 0.1  K/uL   WBC Morphology MILD LEFT SHIFT (1-5% METAS, OCC MYELO, OCC BANDS)     Comment: TOXIC GRANULATION   Metamyelocytes Relative 9 %   Myelocytes 2 %   Promyelocytes Relative 1 %   Reactive, Benign Lymphocytes PRESENT     Comment: Performed at [facility], [address] [place], [place] [address]  Protime-INR     Status: None   Collection Time: 06/12/2020 12:52 PM  Result Value Ref Range   Prothrombin Time 14.1 11.4 - 15.2 seconds   INR 1.1 0.8 - 1.2    Comment: (NOTE) INR goal varies based on device and disease states. Performed at [facility], [address], [place], [place] [address]   APTT     Status: None   Collection Time: 06/07/2020 12:52 PM  Result Value Ref Range   aPTT 31 24 - 36 seconds    Comment: Performed at [facility], [address] [place], [place] [address]  Brain natriuretic peptide     Status: Abnormal   Collection Time: 06/11/2020 12:54 PM  Result Value Ref Range   B Natriuretic Peptide >4,500.0 (H) 0.0 - 100.0 pg/mL    Comment: Performed at [facility], [address] [place], [place] [address]  Blood gas, arterial     Status: Abnormal   Collection Time: 06/22/2020  1:17 PM  Result Value Ref Range   FIO2 80.00    pH, Arterial 7.186 (LL) 7.350 - 7.450    Comment: CRITICAL RESULT CALLED TO, READ BACK BY AND VERIFIED WITH: WHITE,M@1327  BY MATTHEWS, B 1.5.22    pCO2 arterial 53.4 (H) 32.0 - 48.0 mmHg   pO2, Arterial 80.6 (L) 83.0 - 108.0 mmHg   Bicarbonate 17.4 (L) 20.0 - 28.0 mmol/L   Acid-base deficit 7.7 (H) 0.0 - 2.0 mmol/L   O2 Saturation 84.1 %   Patient temperature 39.7    Allens test (pass/fail) PASS PASS    Comment: Performed at [facility], [address] [place], [place] [address]  Lactic acid, plasma     Status: Abnormal   Collection Time: 06/14/2020  3:07 PM  Result Value Ref Range   Lactic Acid, Venous 5.8 (HH) 0.5 - 1.9 mmol/L    Comment: CRITICAL VALUE NOTED.  VALUE IS CONSISTENT WITH PREVIOUSLY REPORTED AND CALLED VALUE. Performed at  [facility], [address], [place],  [address]   Heparin level (unfractionated)     Status: Abnormal   Collection Time: 05/24/2020  3:08 PM  Result Value Ref Range   Heparin Unfractionated 1.46 (H) 0.30 - 0.70 IU/mL    Comment: RESULTS CONFIRMED BY MANUAL DILUTION Performed at [facility], [address], [place],  [address]   CBG monitoring, ED     Status: None   Collection Time: 06/02/2020  3:15 PM  Result Value Ref Range   Glucose-Capillary 93 70 - 99 mg/dL    Comment: Glucose reference range applies only to samples taken after fasting for at least 8 hours.  CBG monitoring, ED     Status: None   Collection Time: 06/14/2020  3:39 PM  Result Value Ref Range   Glucose-Capillary 72 70 - 99 mg/dL    Comment: Glucose reference range applies only to samples taken after fasting for at least 8 hours.    DG Chest Port 1 View  Result Date: 06/02/2020 CLINICAL DATA:  Shortness of breath. EXAM: PORTABLE CHEST 1  VIEW COMPARISON:  05/22/2020 FINDINGS: Again noted is a right jugular dialysis catheter with the tip near the superior cavoatrial junction. New airspace densities in the mid and lower left chest. Evidence for new right perihilar densities. Evidence for chronic right pleural effusion. Lower chest is incompletely imaged. Stable cardiac enlargement. IMPRESSION: 1. New bilateral airspace disease. Differential diagnosis includes asymmetric pulmonary edema versus bilateral pneumonia. 2. Persistent right pleural effusion that is incompletely imaged. 3. Stable cardiomegaly. 4. Stable position of the dialysis catheter. Electronically Signed   By: Markus Daft M.D.   On: 06/10/2020 13:13    ROS: Chart review only  Blood pressure (!) 111/97, pulse (!) 58, temperature (!) 101.8 F (38.8 C), resp. rate 14, height 6' (1.829 m), weight 72.6 kg, SpO2 (!) 80 %. .  Pt not physically examined d/t preventing exposure to other providers and conserving PPE- observations of RN staff and PMD utilized for  MDM   Assessment/Plan: 1 COVID-19 PNA: being intubated currently, getting dex and tocilizumab 2.  Shock: likely septic- blood cultures drawn, broad spectrum abx and pressors per PCCM 3 ESRD: Normally MWF, missed HD today, currently in shock.  No urgent indication for RRT at present but will likely need CRRT.  Low threshold for CRRT overnight if having oxygenation issues on vent. 4 Afib with RVR: on hep gtt 5. Anemia of ESRD: dose ESA as appropriate 6. Metabolic Bone Disease: VDRA and binders when eating 7.  Hyperkalemia: mild, will give lokelma 5 g 8.  Dispo: to ICU  Madelon Lips 05/23/2020, 8:01 PM

## 2020-05-27 NOTE — ED Notes (Signed)
Pt very lethargic - able to open eyes with verbal and touch stimuli.

## 2020-05-27 NOTE — Progress Notes (Addendum)
Pharmacy Antibiotic Note  Lawrence Bonilla is a 70 y.o. male admitted on 06/18/2020 with COVID pna, concern for bacterial related pna.  Pharmacy has been consulted for vancomycin and cefepime dosing.  ESRD-HD, usually MWF, missed HD today per report.  Vancomycin 1g IV and cefepime 2g IV given in [facility] ED today.    Plan: Vancomycin 500 mg IV x 1 (for total 1500 mg loading dose), then 750 mg IV qHD  Cefepime 1g IV every 24 hours Monitor HD schedule, Cx procal and clinical progression to narrow Vancomycin levels as needed  Height: 6' (182.9 cm) Weight: 72.6 kg (160 lb) IBW/kg (Calculated) : 77.6  Temp (24hrs), Avg:102.7 F (39.3 C), Min:99.1 F (37.3 C), Max:103.5 F (39.7 C)  Recent Labs  Lab 05/22/20 0409 06/15/2020 1252 06/06/2020 1507  WBC 3.3* 3.7*  --   CREATININE 6.40* 7.78*  --   LATICACIDVEN  --  8.7* 5.8*    Estimated Creatinine Clearance: 9.2 mL/min (A) (by C-G formula based on SCr of 7.78 mg/dL (H)).    No Known Allergies  Bertis Ruddy, PharmD Clinical Pharmacist ED Pharmacist Phone # [phone] 06/20/2020 7:03 PM

## 2020-05-27 NOTE — ED Provider Notes (Signed)
[facility] EMERGENCY DEPARTMENT Provider Note   CSN: [id number] Arrival date & time: 06/11/2020  1124     History Chief Complaint  Patient presents with  . Respiratory Distress    Lawrence Bonilla is a 70 y.o. male.  Patient presents short of breath and weak.  Patient was diagnosed with Covid 5 days ago.  Patient also has had a dialysis patient and will did not go to dialysis today.  The history is provided by the patient and a relative. No language interpreter was used.  Weakness Severity:  Severe Onset quality:  Sudden Timing:  Constant Progression:  Worsening Chronicity:  New Context: not alcohol use   Relieved by:  Nothing Worsened by:  Nothing Ineffective treatments:  None tried Associated symptoms: no abdominal pain        Past Medical History:  Diagnosis Date  . Atrial fibrillation ([place])   . Chronic systolic CHF (congestive heart failure) ([place])   . Congestive heart failure (CHF) ([place])   . DM type 2 (diabetes mellitus, type 2) ([place])   . ESRD on hemodialysis ([place])   . Essential hypertension   . Headache   . Hyperlipidemia   . Hypothyroidism   . Lymphedema   . OSA on CPAP   . PE (pulmonary thromboembolism) ([place])    High probability for PE on VQ scan 01/29/19  . PVD (peripheral vascular disease) ([place])   . Secondary cardiomyopathy ([place])   . Stroke ([place])   . Vitamin D deficiency disease   . Wears glasses     Patient Active Problem List   Diagnosis Date Noted  . Pneumonia due to COVID-19 virus 05/26/2020  . Goals of care, counseling/discussion   . Palliative care by specialist   . DNR (do not resuscitate) discussion   . Abnormal CT of the head 04/13/2020  . Nasal fracture 04/13/2020  . Essential hypertension   . Macrocytic anemia   . OSA on CPAP   . ESRD on hemodialysis ([place])   . Chronic systolic CHF (congestive heart failure) ([place])   . Hypothyroidism, adult 02/06/2019  . DM type 2 (diabetes mellitus, type 2) ([place]) 02/06/2019  . Vitamin D deficiency  disease 02/06/2019  . Acute on chronic systolic CHF (congestive heart failure) ([place]) 01/28/2019  . CKD (chronic kidney disease), stage IV ([place]) 01/28/2019  . Pleural effusion on right 01/28/2019  . Stroke ([place]) 01/28/2019  . Supratherapeutic INR 01/28/2019    Past Surgical History:  Procedure Laterality Date  . APPENDECTOMY    . AV FISTULA PLACEMENT Left 11/04/2019   Procedure: left radiocephalic ARTERIOVENOUS (AV) FISTULA CREATION;  Surgeon: Rosetta Posner, MD;  Location: [facility];  Service: Vascular;  Laterality: Left;  . BARIATRIC SURGERY    . CATARACT EXTRACTION W/ INTRAOCULAR LENS  IMPLANT, BILATERAL    . HERNIA REPAIR    . LIGATION OF COMPETING BRANCHES OF ARTERIOVENOUS FISTULA Left 02/06/2020   Procedure: LIGATION OF COMPETING BRANCHES OF LEFT ARM ARTERIOVENOUS FISTULA;  Surgeon: Rosetta Posner, MD;  Location: [facility];  Service: Vascular;  Laterality: Left;  . RIGHT HEART CATH N/A 05/02/2019   Procedure: RIGHT HEART CATH;  Surgeon: Jolaine Artist, MD;  Location: [facility];  Service: Cardiovascular;  Laterality: N/A;  . TONSILLECTOMY    . WISDOM TOOTH EXTRACTION         Family History  Problem Relation Age of Onset  . Asthma Mother     Social History   Tobacco Use  . Smoking status:  Never Smoker  . Smokeless tobacco: Never Used  Vaping Use  . Vaping Use: Never used  Substance Use Topics  . Alcohol use: Yes    Comment: 2 SCOTCH   . Drug use: Never    Home Medications Prior to Admission medications   Medication Sig Start Date End Date Taking? Authorizing Provider  acetaminophen (TYLENOL) 325 MG tablet Take 2 tablets (650 mg total) by mouth every 6 (six) hours as needed for mild pain (or Fever >/= 101). 04/14/20   Roxan Hockey, MD  atorvastatin (LIPITOR) 40 MG tablet Take 1 tablet (40 mg total) by mouth daily. 04/14/20   Roxan Hockey, MD  AURYXIA 1 GM 210 MG(Fe) tablet Take 420 mg by mouth 2 (two) times daily.  11/21/19   [provider]   calcitRIOL (ROCALTROL) 0.25 MCG capsule Take 0.25 mcg by mouth daily.    [provider]  calcium acetate (PHOSLO) 667 MG capsule Take 667 mg by mouth 3 (three) times daily. 04/08/20   [provider]  Calcium Carb-Cholecalciferol (CALCIUM 600 + D PO) Take 600 mg by mouth daily.     [provider]  fluticasone (FLONASE) 50 MCG/ACT nasal spray Place 1 spray into both nostrils daily. Patient not taking: Reported on 05/05/2020 02/19/20   Ailene Ards, NP  folic acid (FOLVITE) 1 MG tablet Take 1 tablet (1 mg total) by mouth daily. 04/15/20   Roxan Hockey, MD  furosemide (LASIX) 40 MG tablet Take 2 tablets (80 mg total) by mouth 2 (two) times daily. 01/16/20   Doree Albee, MD  metoprolol succinate (TOPROL-XL) 25 MG 24 hr tablet Take 1 tablet (25 mg total) by mouth daily. Take with or immediately following a meal. 04/14/20   Emokpae, Courage, MD  midodrine (PROAMATINE) 5 MG tablet Take 1 tablet (5 mg total) by mouth 3 (three) times daily with meals. 04/14/20   Roxan Hockey, MD  Multiple Vitamin (MULTIVITAMIN WITH MINERALS) TABS tablet Take 1 tablet by mouth daily. 04/15/20   Roxan Hockey, MD  NP THYROID 90 MG tablet Take 1 tablet (90 mg total) by mouth daily. 05/06/20   Doree Albee, MD  potassium chloride (KLOR-CON) 10 MEQ tablet Take 1 tablet (10 mEq total) by mouth daily. 02/18/20   Hurshel Party C, MD  potassium chloride (MICRO-K) 10 MEQ CR capsule TAKE 1 CAPSULE(10 MEQ) BY MOUTH DAILY 05/18/20   Hurshel Party C, MD  thiamine 100 MG tablet Take 1 tablet (100 mg total) by mouth daily. 04/15/20   Roxan Hockey, MD    Allergies    Patient has no known allergies.  Review of Systems   Review of Systems  Unable to perform ROS: Acuity of condition  Gastrointestinal: Negative for abdominal pain.  Neurological: Positive for weakness.    Physical Exam Updated Vital Signs BP 100/60 (BP Location: Right Arm)   Pulse (!) 156   Temp (!) 102.7 F  (39.3 C) (Core (Comment))   Resp (!) 41   Ht 6' (1.829 m)   Wt 72.6 kg   SpO2 96%   BMI 21.70 kg/m   Physical Exam Vitals reviewed.  Constitutional:      Appearance: He is well-developed.     Comments: Lethargic  HENT:     Head: Normocephalic.     Nose: Nose normal.     Mouth/Throat:     Mouth: Mucous membranes are dry.  Eyes:     General: No scleral icterus.    Extraocular Movements: EOM normal.  Conjunctiva/sclera: Conjunctivae normal.  Neck:     Thyroid: No thyromegaly.  Cardiovascular:     Heart sounds: No murmur heard. No friction rub. No gallop.      Comments: Rapid atrial fib Pulmonary:     Breath sounds: No stridor. No wheezing or rales.  Chest:     Chest wall: No tenderness.  Abdominal:     General: There is no distension.     Tenderness: There is no abdominal tenderness. There is no rebound.  Musculoskeletal:        General: No edema. Normal range of motion.     Cervical back: Neck supple.     Comments: Patient has very cold feet and hands.  Pulses normal.  Lymphadenopathy:     Cervical: No cervical adenopathy.  Skin:    General: Skin is dry.     Findings: No erythema or rash.  Neurological:     Mental Status: He is oriented to person, place, and time.     Motor: No abnormal muscle tone.     Coordination: Coordination normal.  Psychiatric:        Mood and Affect: Mood and affect normal.        Behavior: Behavior normal.     ED Results / Procedures / Treatments   Labs (all labs ordered are listed, but only abnormal results are displayed) Labs Reviewed  BLOOD GAS, ARTERIAL - Abnormal; Notable for the following components:      Result Value   pH, Arterial 7.186 (*)    pCO2 arterial 53.4 (*)    pO2, Arterial 80.6 (*)    Bicarbonate 17.4 (*)    Acid-base deficit 7.7 (*)    All other components within normal limits  LACTIC ACID, PLASMA - Abnormal; Notable for the following components:   Lactic Acid, Venous 8.7 (*)    All other components  within normal limits  COMPREHENSIVE METABOLIC PANEL - Abnormal; Notable for the following components:   Potassium 5.2 (*)    Chloride 94 (*)    CO2 21 (*)    BUN 77 (*)    Creatinine, Ser 7.78 (*)    Albumin 3.2 (*)    AST 86 (*)    GFR, Estimated 7 (*)    Anion gap 22 (*)    All other components within normal limits  CBC WITH DIFFERENTIAL/PLATELET - Abnormal; Notable for the following components:   WBC 3.7 (*)    RBC 3.67 (*)    Hemoglobin 12.5 (*)    MCV 111.4 (*)    MCH 34.1 (*)    Lymphs Abs 0.4 (*)    Monocytes Absolute 0.0 (*)    All other components within normal limits  BRAIN NATRIURETIC PEPTIDE - Abnormal; Notable for the following components:   B Natriuretic Peptide >4,500.0 (*)    All other components within normal limits  CULTURE, BLOOD (SINGLE)  URINE CULTURE  PROTIME-INR  APTT  LACTIC ACID, PLASMA  URINALYSIS, ROUTINE W REFLEX MICROSCOPIC  HEMOGLOBIN A1C  HEPARIN LEVEL (UNFRACTIONATED)    EKG None  Radiology DG Chest Port 1 View  Result Date: 06/18/2020 CLINICAL DATA:  Shortness of breath. EXAM: PORTABLE CHEST 1 VIEW COMPARISON:  05/22/2020 FINDINGS: Again noted is a right jugular dialysis catheter with the tip near the superior cavoatrial junction. New airspace densities in the mid and lower left chest. Evidence for new right perihilar densities. Evidence for chronic right pleural effusion. Lower chest is incompletely imaged. Stable cardiac enlargement. IMPRESSION: 1. New bilateral airspace  disease. Differential diagnosis includes asymmetric pulmonary edema versus bilateral pneumonia. 2. Persistent right pleural effusion that is incompletely imaged. 3. Stable cardiomegaly. 4. Stable position of the dialysis catheter. Electronically Signed   By: Markus Daft M.D.   On: 06/03/2020 13:13    Procedures Procedures (including critical care time)  Medications Ordered in ED Medications  dexamethasone (DECADRON) injection 6 mg (has no administration in time range)   baricitinib (OLUMIANT) tablet 4 mg (has no administration in time range)  0.9 %  sodium chloride infusion (has no administration in time range)  phenylephrine (NEOSYNEPHRINE) 10-0.9 MG/250ML-% infusion (has no administration in time range)  0.9 %  sodium chloride infusion (has no administration in time range)  atorvastatin (LIPITOR) tablet 40 mg (has no administration in time range)  calcitRIOL (ROCALTROL) capsule 0.25 mcg (has no administration in time range)  calcium acetate (PHOSLO) capsule 667 mg (has no administration in time range)  folic acid (FOLVITE) tablet 1 mg (has no administration in time range)  midodrine (PROAMATINE) tablet 5 mg (has no administration in time range)  multivitamin with minerals tablet 1 tablet (has no administration in time range)  thyroid (ARMOUR) tablet 90 mg (has no administration in time range)  thiamine tablet 100 mg (has no administration in time range)  acetaminophen (TYLENOL) tablet 650 mg (has no administration in time range)  insulin aspart (novoLOG) injection 0-6 Units (has no administration in time range)  acetaminophen (TYLENOL) tablet 1,000 mg (1,000 mg Oral Given 05/24/2020 1305)  ceFEPIme (MAXIPIME) 2 g in sodium chloride 0.9 % 100 mL IVPB (0 g Intravenous Stopped 06/16/2020 1349)  vancomycin (VANCOCIN) IVPB 1000 mg/200 mL premix (1,000 mg Intravenous New Bag/Given 06/20/2020 1333)  sodium chloride 0.9 % bolus 1,000 mL (1,000 mLs Intravenous New Bag/Given 06/07/2020 1405)  sodium chloride 0.9 % bolus 1,000 mL (1,000 mLs Intravenous New Bag/Given 06/05/2020 1403)    ED Course  I have reviewed the triage vital signs and the nursing notes.  Pertinent labs & imaging results that were available during my care of the patient were reviewed by me and considered in my medical decision making (see chart for details).    CRITICAL CARE Performed by: Milton Ferguson Total critical care time: 40 minutes Critical care time was exclusive of separately billable procedures and  treating other patients. Critical care was necessary to treat or prevent imminent or life-threatening deterioration. Critical care was time spent personally by me on the following activities: development of treatment plan with patient and/or surrogate as well as nursing, discussions with consultants, evaluation of patient's response to treatment, examination of patient, obtaining history from patient or surrogate, ordering and performing treatments and interventions, ordering and review of laboratory studies, ordering and review of radiographic studies, pulse oximetry and re-evaluation of patient's condition.  MDM Rules/Calculators/A&P                          Patient with sepsis Covid positive respiratory distress renal failure possibly heart failure IV pneumonia.  Critical care has been consulted and feels the patient needs to be admitted over at [facility] because he will need continuous dialysis.  Critical care did not recommend intubation at this time or pressors Final Clinical Impression(s) / ED Diagnoses Final diagnoses:  Respiratory failure [facility])    Rx / DC Orders ED Discharge Orders    None       Milton Ferguson, MD 06/03/2020 1455

## 2020-05-27 NOTE — ED Notes (Signed)
Critical Care provider at bedside

## 2020-05-27 NOTE — ED Notes (Signed)
Vital signs set to cycle q5 minutes at this time.

## 2020-05-27 NOTE — ED Notes (Signed)
Dr. Ander Slade made aware of neosynophrine drip already at max of 236mcg/min. bp 61/49.

## 2020-05-27 NOTE — ED Notes (Signed)
OG withdrawn 1cm. Will order for rpt xray.

## 2020-05-27 NOTE — Progress Notes (Signed)
Attempted to call/ update wife.  Patient now on BiPAP.  Still high intubation/ decompensation risk.   Went straight to [employer].  No message left as this was just a update since he arrived at [facility].     I have paged nephrology to see patient, awaiting call back.      Kennieth Rad, ACNP [facility] 06/05/2020, 6:50 PM

## 2020-05-27 NOTE — ED Notes (Addendum)
Respiratory at bedside at this time.  This RN remains at bedside, pt is alert and oriented, speaking with staff at this time. Pt educated on current plan of care at this time and in agreement at this time.

## 2020-05-27 NOTE — Progress Notes (Signed)
IVT visit: Patient w/  New CVL,RN stated no need for PIV at this time.

## 2020-05-27 NOTE — ED Notes (Addendum)
Dr. Halford Chessman paged and made aware that patient's recent respiratory assessment presents with an audible wet and worsening crackles. Currently awaiting a page back at this time.   3:58 PM Per Dr. Halford Chessman, okay to hold the fulids and maintain KVO rate. This RN verbalized understanding at this time and is in agreement. Educated on current core temperature at this time. Okay for a cooling blanket for temperature control, this RN in agreement and verbalized understanding at this time.

## 2020-05-27 NOTE — ED Notes (Signed)
Date and time results received: 05/28/2020 10:33 PM   Test: lactic acid Critical Value: 7.5  Name of Provider Notified: Ander Slade MD

## 2020-05-27 NOTE — ED Notes (Signed)
Dr. Roderic Palau to bedside for assessment at this time. Pt appears asleep in bed, easily arousable and answers questions appropriately. Bed is locked in the lowest position, side rails x2, call bell within reach.  Vital signs cycling q15 minutes. Will continue to monitor at this time.

## 2020-05-27 NOTE — Progress Notes (Signed)
Checked on the patient this evening Tachycardic into the 140s, tachypneic  Unresponsive Poor response to noxious stimuli  Labs reviewed  Decision made to intubate  Successfully intubated with a size 7.5 endotracheal tube  Blood pressure did drop post intubation  Receiving 250 cc of saline bolus  Neo-Synephrine rate increased  We will continue to monitor closely  Acute hypoxic and hypercapnic respiratory failure due to COVID-19 viral pneumonia Failed BiPAP  Septic shock No known cause We will continue current antibiotics Follow cultures  End-stage renal disease -Does not appear to need acute dialysis at present  Fever Continue to monitor   30 minutes of additional critical care time spent evaluating and treating patient

## 2020-05-28 ENCOUNTER — Inpatient Hospital Stay (HOSPITAL_COMMUNITY): Payer: Medicare HMO

## 2020-05-28 ENCOUNTER — Telehealth (INDEPENDENT_AMBULATORY_CARE_PROVIDER_SITE_OTHER): Payer: Medicare HMO | Admitting: Nurse Practitioner

## 2020-05-28 DIAGNOSIS — R6521 Severe sepsis with septic shock: Secondary | ICD-10-CM | POA: Diagnosis not present

## 2020-05-28 DIAGNOSIS — A419 Sepsis, unspecified organism: Secondary | ICD-10-CM

## 2020-05-28 DIAGNOSIS — J1282 Pneumonia due to coronavirus disease 2019: Secondary | ICD-10-CM | POA: Diagnosis not present

## 2020-05-28 DIAGNOSIS — L8990 Pressure ulcer of unspecified site, unspecified stage: Secondary | ICD-10-CM | POA: Insufficient documentation

## 2020-05-28 DIAGNOSIS — U071 COVID-19: Secondary | ICD-10-CM | POA: Diagnosis not present

## 2020-05-28 LAB — RENAL FUNCTION PANEL
Albumin: 2 g/dL — ABNORMAL LOW (ref 3.5–5.0)
Albumin: 1.9 g/dL — ABNORMAL LOW (ref 3.5–5.0)
Albumin: 1.9 g/dL — ABNORMAL LOW (ref 3.5–5.0)
Anion gap: 22 — ABNORMAL HIGH (ref 5–15)
Anion gap: 19 — ABNORMAL HIGH (ref 5–15)
Anion gap: 21 — ABNORMAL HIGH (ref 5–15)
BUN: 83 mg/dL — ABNORMAL HIGH (ref 8–23)
BUN: 74 mg/dL — ABNORMAL HIGH (ref 8–23)
BUN: 85 mg/dL — ABNORMAL HIGH (ref 8–23)
CO2: 19 mmol/L — ABNORMAL LOW (ref 22–32)
CO2: 20 mmol/L — ABNORMAL LOW (ref 22–32)
CO2: 20 mmol/L — ABNORMAL LOW (ref 22–32)
Calcium: 6.5 mg/dL — ABNORMAL LOW (ref 8.9–10.3)
Calcium: 6.1 mg/dL — CL (ref 8.9–10.3)
Calcium: 6.1 mg/dL — CL (ref 8.9–10.3)
Chloride: 97 mmol/L — ABNORMAL LOW (ref 98–111)
Chloride: 95 mmol/L — ABNORMAL LOW (ref 98–111)
Chloride: 94 mmol/L — ABNORMAL LOW (ref 98–111)
Creatinine, Ser: 7.09 mg/dL — ABNORMAL HIGH (ref 0.61–1.24)
Creatinine, Ser: 6.05 mg/dL — ABNORMAL HIGH (ref 0.61–1.24)
Creatinine, Ser: 6.8 mg/dL — ABNORMAL HIGH (ref 0.61–1.24)
GFR, Estimated: 8 mL/min — ABNORMAL LOW (ref >60)
GFR, Estimated: 9 mL/min — ABNORMAL LOW (ref >60)
GFR, Estimated: 8 mL/min — ABNORMAL LOW (ref >60)
Glucose, Bld: 93 mg/dL (ref 70–99)
Glucose, Bld: 113 mg/dL — ABNORMAL HIGH (ref 70–99)
Glucose, Bld: 101 mg/dL — ABNORMAL HIGH (ref 70–99)
Phosphorus: 9.5 mg/dL — ABNORMAL HIGH (ref 2.5–4.6)
Phosphorus: 7.9 mg/dL — ABNORMAL HIGH (ref 2.5–4.6)
Phosphorus: 8.8 mg/dL — ABNORMAL HIGH (ref 2.5–4.6)
Potassium: 4.5 mmol/L (ref 3.5–5.1)
Potassium: 4.2 mmol/L (ref 3.5–5.1)
Potassium: 4.5 mmol/L (ref 3.5–5.1)
Sodium: 138 mmol/L (ref 135–145)
Sodium: 134 mmol/L — ABNORMAL LOW (ref 135–145)
Sodium: 135 mmol/L (ref 135–145)

## 2020-05-28 LAB — I-STAT ARTERIAL BLOOD GAS, ED
Acid-base deficit: 10 mmol/L — ABNORMAL HIGH (ref 0.0–2.0)
Acid-base deficit: 7 mmol/L — ABNORMAL HIGH (ref 0.0–2.0)
Bicarbonate: 18 mmol/L — ABNORMAL LOW (ref 20.0–28.0)
Bicarbonate: 18.7 mmol/L — ABNORMAL LOW (ref 20.0–28.0)
Calcium, Ion: 0.93 mmol/L — ABNORMAL LOW (ref 1.15–1.40)
Calcium, Ion: 0.87 mmol/L — CL (ref 1.15–1.40)
HCT: 36 % — ABNORMAL LOW (ref 39.0–52.0)
HCT: 31 % — ABNORMAL LOW (ref 39.0–52.0)
Hemoglobin: 12.2 g/dL — ABNORMAL LOW (ref 13.0–17.0)
Hemoglobin: 10.5 g/dL — ABNORMAL LOW (ref 13.0–17.0)
O2 Saturation: 90 %
O2 Saturation: 95 %
Patient temperature: 96.8
Patient temperature: 99.2
Potassium: 4.4 mmol/L (ref 3.5–5.1)
Potassium: 4.2 mmol/L (ref 3.5–5.1)
Sodium: 137 mmol/L (ref 135–145)
Sodium: 136 mmol/L (ref 135–145)
TCO2: 19 mmol/L — ABNORMAL LOW (ref 22–32)
TCO2: 20 mmol/L — ABNORMAL LOW (ref 22–32)
pCO2 arterial: 45.3 mmHg (ref 32.0–48.0)
pCO2 arterial: 36.6 mmHg (ref 32.0–48.0)
pH, Arterial: 7.201 — ABNORMAL LOW (ref 7.350–7.450)
pH, Arterial: 7.318 — ABNORMAL LOW (ref 7.350–7.450)
pO2, Arterial: 68 mmHg — ABNORMAL LOW (ref 83.0–108.0)
pO2, Arterial: 86 mmHg (ref 83.0–108.0)

## 2020-05-28 LAB — APTT
aPTT: 128 seconds — ABNORMAL HIGH (ref 24–36)
aPTT: 86 seconds — ABNORMAL HIGH (ref 24–36)
aPTT: >200 seconds (ref 24–36)
aPTT: >200 seconds (ref 24–36)
aPTT: >200 seconds (ref 24–36)

## 2020-05-28 LAB — GLUCOSE, CAPILLARY
Glucose-Capillary: 115 mg/dL — ABNORMAL HIGH (ref 70–99)
Glucose-Capillary: 146 mg/dL — ABNORMAL HIGH (ref 70–99)
Glucose-Capillary: 162 mg/dL — ABNORMAL HIGH (ref 70–99)
Glucose-Capillary: 48 mg/dL — ABNORMAL LOW (ref 70–99)
Glucose-Capillary: 56 mg/dL — ABNORMAL LOW (ref 70–99)
Glucose-Capillary: 72 mg/dL (ref 70–99)

## 2020-05-28 LAB — COMPREHENSIVE METABOLIC PANEL
ALT: 304 U/L — ABNORMAL HIGH (ref 0–44)
AST: 965 U/L — ABNORMAL HIGH (ref 15–41)
Albumin: 2 g/dL — ABNORMAL LOW (ref 3.5–5.0)
Alkaline Phosphatase: 77 U/L (ref 38–126)
Anion gap: 23 — ABNORMAL HIGH (ref 5–15)
BUN: 79 mg/dL — ABNORMAL HIGH (ref 8–23)
CO2: 15 mmol/L — ABNORMAL LOW (ref 22–32)
Calcium: 7.1 mg/dL — ABNORMAL LOW (ref 8.9–10.3)
Chloride: 100 mmol/L (ref 98–111)
Creatinine, Ser: 7.4 mg/dL — ABNORMAL HIGH (ref 0.61–1.24)
GFR, Estimated: 7 mL/min — ABNORMAL LOW (ref >60)
Glucose, Bld: 188 mg/dL — ABNORMAL HIGH (ref 70–99)
Potassium: 4.3 mmol/L (ref 3.5–5.1)
Sodium: 138 mmol/L (ref 135–145)
Total Bilirubin: 1.1 mg/dL (ref 0.3–1.2)
Total Protein: 5 g/dL — ABNORMAL LOW (ref 6.5–8.1)

## 2020-05-28 LAB — CBC
HCT: 35.4 % — ABNORMAL LOW (ref 39.0–52.0)
HCT: 31.9 % — ABNORMAL LOW (ref 39.0–52.0)
Hemoglobin: 10.7 g/dL — ABNORMAL LOW (ref 13.0–17.0)
Hemoglobin: 10.4 g/dL — ABNORMAL LOW (ref 13.0–17.0)
MCH: 34.1 pg — ABNORMAL HIGH (ref 26.0–34.0)
MCH: 34.6 pg — ABNORMAL HIGH (ref 26.0–34.0)
MCHC: 30.2 g/dL (ref 30.0–36.0)
MCHC: 32.6 g/dL (ref 30.0–36.0)
MCV: 112.7 fL — ABNORMAL HIGH (ref 80.0–100.0)
MCV: 106 fL — ABNORMAL HIGH (ref 80.0–100.0)
Platelets: 127 10*3/uL — ABNORMAL LOW (ref 150–400)
Platelets: UNDETERMINED 10*3/uL (ref 150–400)
RBC: 3.14 MIL/uL — ABNORMAL LOW (ref 4.22–5.81)
RBC: 3.01 MIL/uL — ABNORMAL LOW (ref 4.22–5.81)
RDW: 14.3 % (ref 11.5–15.5)
RDW: 14.3 % (ref 11.5–15.5)
WBC: 4 10*3/uL (ref 4.0–10.5)
WBC: 8.7 10*3/uL (ref 4.0–10.5)
nRBC: 1 % — ABNORMAL HIGH (ref 0.0–0.2)
nRBC: 0 % (ref 0.0–0.2)

## 2020-05-28 LAB — MAGNESIUM
Magnesium: 2.1 mg/dL (ref 1.7–2.4)
Magnesium: 2 mg/dL (ref 1.7–2.4)

## 2020-05-28 LAB — CBG MONITORING, ED
Glucose-Capillary: 112 mg/dL — ABNORMAL HIGH (ref 70–99)
Glucose-Capillary: 126 mg/dL — ABNORMAL HIGH (ref 70–99)
Glucose-Capillary: 149 mg/dL — ABNORMAL HIGH (ref 70–99)
Glucose-Capillary: 84 mg/dL (ref 70–99)

## 2020-05-28 LAB — LACTIC ACID, PLASMA
Lactic Acid, Venous: 6.6 mmol/L (ref 0.5–1.9)
Lactic Acid, Venous: 6.7 mmol/L (ref 0.5–1.9)
Lactic Acid, Venous: 5.7 mmol/L (ref 0.5–1.9)

## 2020-05-28 LAB — HEPARIN LEVEL (UNFRACTIONATED)
Heparin Unfractionated: 0.87 IU/mL — ABNORMAL HIGH (ref 0.30–0.70)
Heparin Unfractionated: 1.09 IU/mL — ABNORMAL HIGH (ref 0.30–0.70)
Heparin Unfractionated: 1.36 IU/mL — ABNORMAL HIGH (ref 0.30–0.70)
Heparin Unfractionated: 1.38 IU/mL — ABNORMAL HIGH (ref 0.30–0.70)
Heparin Unfractionated: 1.38 IU/mL — ABNORMAL HIGH (ref 0.30–0.70)

## 2020-05-28 LAB — PHOSPHORUS: Phosphorus: 10.1 mg/dL — ABNORMAL HIGH (ref 2.5–4.6)

## 2020-05-28 LAB — MRSA PCR SCREENING: MRSA by PCR: NEGATIVE

## 2020-05-28 MED ORDER — PRISMASOL BGK 4/2.5 32-4-2.5 MEQ/L EC SOLN
Status: DC
  Filled 2020-05-28 (×10): qty 5000

## 2020-05-28 MED ORDER — SODIUM BICARBONATE 8.4 % IV SOLN
50.0000 meq | Freq: Once | INTRAVENOUS | Status: AC
  Administered 2020-05-28: 50 meq via INTRAVENOUS
  Filled 2020-05-28: qty 50

## 2020-05-28 MED ORDER — HEPARIN SODIUM (PORCINE) 1000 UNIT/ML DIALYSIS
1000.0000 [IU] | INTRAMUSCULAR | Status: DC | PRN
  Filled 2020-05-28: qty 6

## 2020-05-28 MED ORDER — PANTOPRAZOLE SODIUM 40 MG IV SOLR
40.0000 mg | Freq: Two times a day (BID) | INTRAVENOUS | Status: DC
  Administered 2020-05-28 – 2020-05-29 (×2): 40 mg via INTRAVENOUS
  Filled 2020-05-28 (×2): qty 40

## 2020-05-28 MED ORDER — NOREPINEPHRINE 16 MG/250ML-% IV SOLN
0.0000 ug/min | INTRAVENOUS | Status: DC
  Administered 2020-05-28: 40 ug/min via INTRAVENOUS
  Administered 2020-05-29: 50 ug/min via INTRAVENOUS
  Administered 2020-05-29: 60 ug/min via INTRAVENOUS
  Administered 2020-05-29: 53 ug/min via INTRAVENOUS
  Administered 2020-05-29: 45 ug/min via INTRAVENOUS
  Filled 2020-05-28 (×5): qty 250

## 2020-05-28 MED ORDER — ATORVASTATIN CALCIUM 40 MG PO TABS
40.0000 mg | ORAL_TABLET | Freq: Every day | ORAL | Status: DC
  Administered 2020-05-28: 40 mg
  Filled 2020-05-28: qty 4

## 2020-05-28 MED ORDER — FOLIC ACID 1 MG PO TABS
1.0000 mg | ORAL_TABLET | Freq: Every day | ORAL | Status: DC
  Administered 2020-05-28 – 2020-05-29 (×2): 1 mg
  Filled 2020-05-28 (×2): qty 1

## 2020-05-28 MED ORDER — PHENYLEPHRINE CONCENTRATED 100MG/250ML (0.4 MG/ML) INFUSION SIMPLE
0.0000 ug/min | INTRAVENOUS | Status: DC
  Administered 2020-05-28: 400 ug/min via INTRAVENOUS
  Administered 2020-05-28: 40 ug/min via INTRAVENOUS
  Administered 2020-05-29 (×4): 400 ug/min via INTRAVENOUS
  Filled 2020-05-28 (×9): qty 250

## 2020-05-28 MED ORDER — ADULT MULTIVITAMIN W/MINERALS CH
1.0000 | ORAL_TABLET | Freq: Every day | ORAL | Status: DC
  Administered 2020-05-28 – 2020-05-29 (×2): 1
  Filled 2020-05-28 (×2): qty 1

## 2020-05-28 MED ORDER — ORAL CARE MOUTH RINSE
15.0000 mL | OROMUCOSAL | Status: DC
  Administered 2020-05-28 – 2020-05-29 (×13): 15 mL via OROMUCOSAL

## 2020-05-28 MED ORDER — DEXTROSE 50 % IV SOLN
INTRAVENOUS | Status: AC
  Administered 2020-05-28: 50 mL
  Filled 2020-05-28: qty 50

## 2020-05-28 MED ORDER — THYROID 60 MG PO TABS
90.0000 mg | ORAL_TABLET | Freq: Every day | ORAL | Status: DC
  Administered 2020-05-29: 90 mg
  Filled 2020-05-28: qty 1
  Filled 2020-05-28: qty 3

## 2020-05-28 MED ORDER — HEPARIN (PORCINE) 2000 UNITS/L FOR CRRT: INTRAVENOUS_CENTRAL | Status: DC | PRN

## 2020-05-28 MED ORDER — MIDODRINE HCL 5 MG PO TABS
5.0000 mg | ORAL_TABLET | Freq: Three times a day (TID) | ORAL | Status: DC
  Administered 2020-05-28 – 2020-05-29 (×5): 5 mg
  Filled 2020-05-28 (×5): qty 1

## 2020-05-28 MED ORDER — PRISMASOL BGK 4/2.5 32-4-2.5 MEQ/L REPLACEMENT SOLN
Status: DC
  Filled 2020-05-28 (×4): qty 5000

## 2020-05-28 MED ORDER — PRISMASOL BGK 4/2.5 32-4-2.5 MEQ/L REPLACEMENT SOLN
Status: DC
  Filled 2020-05-28 (×2): qty 5000

## 2020-05-28 MED ORDER — SODIUM CHLORIDE 0.9 % IV SOLN
2.0000 g | Freq: Two times a day (BID) | INTRAVENOUS | Status: DC
  Administered 2020-05-28 – 2020-05-29 (×2): 2 g via INTRAVENOUS
  Filled 2020-05-28 (×2): qty 2

## 2020-05-28 MED ORDER — THIAMINE HCL 100 MG PO TABS
100.0000 mg | ORAL_TABLET | Freq: Every day | ORAL | Status: DC
  Administered 2020-05-28 – 2020-05-29 (×2): 100 mg
  Filled 2020-05-28 (×2): qty 1

## 2020-05-28 MED ORDER — HEPARIN (PORCINE) 25000 UT/250ML-% IV SOLN
450.0000 [IU]/h | INTRAVENOUS | Status: DC
  Administered 2020-05-29: 450 [IU]/h via INTRAVENOUS
  Filled 2020-05-28: qty 250

## 2020-05-28 MED ORDER — CALCITRIOL 0.25 MCG PO CAPS
0.2500 ug | ORAL_CAPSULE | Freq: Every day | ORAL | Status: DC
  Administered 2020-05-29: 0.25 ug
  Filled 2020-05-28 (×3): qty 1

## 2020-05-28 MED ORDER — HEPARIN (PORCINE) 25000 UT/250ML-% IV SOLN
800.0000 [IU]/h | INTRAVENOUS | Status: DC
  Administered 2020-05-28: 800 [IU]/h via INTRAVENOUS
  Filled 2020-05-28: qty 250

## 2020-05-28 MED ORDER — CHLORHEXIDINE GLUCONATE 0.12% ORAL RINSE (MEDLINE KIT)
15.0000 mL | Freq: Two times a day (BID) | OROMUCOSAL | Status: DC
  Administered 2020-05-28 – 2020-05-29 (×3): 15 mL via OROMUCOSAL

## 2020-05-28 MED ORDER — VANCOMYCIN VARIABLE DOSE PER UNSTABLE RENAL FUNCTION (PHARMACIST DOSING): Status: DC

## 2020-05-28 MED ORDER — PHENYLEPHRINE HCL-NACL 10-0.9 MG/250ML-% IV SOLN
INTRAVENOUS | Status: AC
  Administered 2020-05-28: 45 mg
  Filled 2020-05-28: qty 250

## 2020-05-28 MED ORDER — LACTATED RINGERS IV BOLUS
500.0000 mL | Freq: Once | INTRAVENOUS | Status: AC
  Administered 2020-05-28: 500 mL via INTRAVENOUS

## 2020-05-28 MED ORDER — CHLORHEXIDINE GLUCONATE CLOTH 2 % EX PADS
6.0000 | MEDICATED_PAD | Freq: Every day | CUTANEOUS | Status: DC
  Administered 2020-05-28: 6 via TOPICAL

## 2020-05-28 MED FILL — Phenylephrine HCl IV Soln 10 MG/ML: INTRAVENOUS | Qty: 10 | Status: AC

## 2020-05-28 MED FILL — Sodium Chloride IV Soln 0.9%: INTRAVENOUS | Qty: 250 | Status: AC

## 2020-05-28 NOTE — Progress Notes (Signed)
RT note. Attempt to place aline in pts. Rt radial x2 using doppler and sterile technique. Unable to place at this time.

## 2020-05-28 NOTE — Progress Notes (Signed)
Bilateral lower extremity redness, pulse doppler, legs and cool to touch. Left arm cool to touch red AVF in place

## 2020-05-28 NOTE — Progress Notes (Addendum)
NAME:  Lawrence Bonilla, MRN:  [id number], DOB:  [birth year], LOS: 1 ADMISSION DATE:  05/28/2020, CONSULTATION DATE: 05/27/2020 REFERRING MD:  Dr. Roderic Palau, ER, CHIEF COMPLAINT:  Respiratory failure   Brief History:  70 yo male developed cough, fever and dyspnea on 05/22/20.  Presented to ER and tested positive for COVID 19 infection.  CXR at that time didn't show infiltrate and he was discharged home.  Returned to ER on 05/27/20 with worsening fever (103.83F), cough and dyspnea.  Found to have hypoxia (SpO2 70% on room air), hypotension, tachycardia, and A fib with RVR.  CXR showed progressive b/l infiltrates consistent with COVID 19 pneumonia.  PCCM asked to assess for admission.  Past Medical History:  ESRD on HD, Combined CHF, A fib on eliquis, PE in 2020, CVA, Vit D deficiency, OSA on CPAP, Lymphedema, Hypothyroidism, HLD, DM type 2  Significant Hospital Events:  1/05 Admit 12/6: In refractory shock on high-dose pressors.  Mottled.  Remains on high FiO2 and PEEP.  Still awaiting transfer to intensive care Consults:    Procedures:  1/5 intubation 1/5 left IJ triple-lumen catheter.  Significant Diagnostic Tests:    Micro Data:  COVID 05/22/20 >> Positive Flu 05/22/20 >> negative Blood 02/24/21 >>   COVID Therapy:  Decadron 1/05 >> Tocilizumab 1/05  Antimicrobials:  Vancomycin 1/05 Cefepime 1/05   Interim History / Subjective:  Rapidly decompensates anytime pressors are off  Objective   Blood pressure 101/76, pulse (Abnormal) 36, temperature 99.2 F (37.3 C), resp. rate (Abnormal) 36, height 5\' 11"  (1.803 m), weight 72.6 kg, SpO2 96 %.    Vent Mode: PRVC FiO2 (%):  [90 %-100 %] 100 % Set Rate:  [20 bmp-35 bmp] 35 bmp Vt Set:  [450 mL-500 mL] 500 mL PEEP:  [10 cmH20] 10 cmH20 Plateau Pressure:  [22 cmH20-30 cmH20] 29 cmH20   Intake/Output Summary (Last 24 hours) at 05/28/2020 9767 Last data filed at 06/15/2020 1510 Gross per 24 hour  Intake 300 ml  Output no documentation   Net 300 ml   Filed Weights   05/25/2020 1227 06/01/2020 1315  Weight: 72.6 kg 72.6 kg    Examination:  General: 70 year old male patient critically ill HEENT normocephalic atraumatic no jugular venous distention orally intubated Pulmonary: Diffuse rhonchi, crackles both bases.  Currently PEEP 10 FiO2 100% Plateau pressures currently 26 Cardiac: Regular irregular Abdomen foul-smelling liquid stool bowel sounds present but diminished no organomegaly Extremities cool mottled pitting edema Neuro heavily sedated and unresponsive GU anuric  Resolved Hospital Problem list     Assessment & Plan:   Acute hypoxic respiratory failure from COVID 19 pneumonia. Fully vaccinated Tracheostomy is in satisfactory position, HD catheter in satisfactory position.  There is bilateral disease with right greater than left pleural effusion -Arterial blood gas reviewed Status post Actemra 11/5 Plan: Full ventilator support, 19mL/kg predicted body weight  Titrate PEEP/and PaO2 greater than 65 Day #2 of 10 Decadron VAP bundle PAD protocol, RASS goal -2 to -3 A.m. chest x-ray Has bilateral effusions, if persists after dialysis started could consider diagnostic thoracentesis  Septic shock with multiple organ failure and lactic acidosis.  I am concerned about possible ischemic gut given rising lactic acid liquid stool and overall clinical decline Plan Continue to titrate pressors for mean arterial pressure greater and 65 Ensure pH greater than 7.2 Holding antihypertensives Keep euvolemic Telemetry monitoring Repeat lactate Day #2 vancomycin and cefepime Follow-up cultures No further Actemra given concern for possible superinfection  A fib with RVR with  history of atrial fibrillation present prior to admission. Chronic combined CHF >> Echo from 04/23/20 with EF 50%. HLD. Plan Continue heparin drip, typically on DOAC Telemetry monitoring Holding Toprol and Lasix  ESRD on HD. Mild  hyperkalemia. -Has HD catheter Seen by nephrology Plan Repeat chemistry this morning CRRT  Steroid induced hyperglycemia. plan Sliding scale insulin   Hx of hypothyroidism. - last TSH 6.24 from 05/05/20 Plan Continue Synthroid  Acute metabolic encephalopathy from sepsis, hypoxia. Plan Supportive care, PAD protocol  Best practice (evaluated daily)  Diet: Nothing by mouth Pain/Anxiety/Delirium protocol (if indicated): 1/5 VAP protocol (if indicated): 1/5 DVT prophylaxis: Heparin gtt GI prophylaxis: PPI Glucose control: SSI Mobility: Bed rest Disposition: Admit to [facility] ICU (either 71m or 77m)  Goals of Care:  Last date of multidisciplinary goals of care discussion: Pending Family and staff present: Pending, wife did not answer phone on attempt to call this a.m. 1/6 Summary of discussion: Pending Follow up goals of care discussion due: Pending Code Status: Full code & awaiting transfer to ICU    Critical care time: 42 minutes  Erick Colace ACNP-BC [place] Pager # [phone] OR # [phone] if no answer

## 2020-05-28 NOTE — ED Notes (Signed)
Date and time results received: 05/28/20 0130   Test: blood cultures (first set) Critical Value: aerobic and anaerobic positive for gram negative cocci  Name of Provider Notified: Ander Slade MD

## 2020-05-28 NOTE — Procedures (Signed)
Arterial Catheter Insertion Procedure Note  BADER STUBBLEFIELD  [id number]  [birth year]  Date:05/28/20  Time:9:08 PM    Provider Performing: Laurin Coder    Procedure: Insertion of Arterial Line [phone]) with US guidance (10254)   Indication(s) Blood pressure monitoring and/or need for frequent ABGs  Consent Unable to obtain consent due to emergent nature of procedure.  Anesthesia None   Time Out Verified patient identification, verified procedure, site/side was marked, verified correct patient position, special equipment/implants available, medications/allergies/relevant history reviewed, required imaging and test results available.   Sterile Technique Maximal sterile technique including full sterile barrier drape, hand hygiene, sterile gown, sterile gloves, mask, hair covering, sterile ultrasound probe cover (if used).   Procedure Description Area of catheter insertion was cleaned with chlorhexidine and draped in sterile fashion. With real-time ultrasound guidance an arterial catheter was placed into the right radial artery.  Appropriate arterial tracings confirmed on monitor.     Complications/Tolerance None; patient tolerated the procedure well.   EBL Minimal   Specimen(s) None

## 2020-05-28 NOTE — Progress Notes (Signed)
Pharmacy Antibiotic Note  Lawrence Bonilla is a 70 y.o. male admitted on 06/03/2020 with COVID pna, concern for bacterial related pna.  Pharmacy has been consulted for vancomycin and cefepime dosing.  ESRD-HD, usually MWF, now to transition to CRRT tonight - will adjust ABX dosing.  Plan: -Vancomycin 750mg  IV q24h starting tomorrow (to allow 24h of CRRT clearance) -Cefepime 2g IV q12h -Follow further RRT plans, cultures, LOT   Height: 5\' 11"  (180.3 cm) Weight: 72.6 kg (160 lb) IBW/kg (Calculated) : 75.3  Temp (24hrs), Avg:98.8 F (37.1 C), Min:95.6 F (35.3 C), Max:103.2 F (39.6 C)  Recent Labs  Lab 05/22/20 0409 05/23/2020 1252 06/22/2020 1507 06/07/2020 2147 05/28/20 0251 05/28/20 1144  WBC 3.3* 3.7*  --   --  4.0  --   CREATININE 6.40* 7.78*  --   --  7.40* 7.09*  LATICACIDVEN  --  8.7* 5.8* 7.5* 6.6* 6.7*    Estimated Creatinine Clearance: 10.1 mL/min (A) (by C-G formula based on SCr of 7.09 mg/dL (H)).    No Known Allergies   Arrie Senate, PharmD, BCPS, [facility] Clinical Pharmacist [phone] Please check AMION for all [place] numbers 05/28/2020

## 2020-05-28 NOTE — Progress Notes (Signed)
[place] Progress Note Patient Name: Lawrence Bonilla DOB: 1951-05-05 MRN: [id number]   Date of Service  05/28/2020  HPI/Events of Note  ABG on 100%/PRVC 35/TV 450/P 10 = 7.20/45.1/68. Ppeak = 28.  eICU Interventions  Plan: 1. Increase TV to 500. 2. NaHCO3 50 meq IV now.  3. Repeat ABG at 8 AM.      Intervention Category Major Interventions: Acid-Base disturbance - evaluation and management;Respiratory failure - evaluation and management  Lyra Alaimo Eugene 05/28/2020, 7:05 AM

## 2020-05-28 NOTE — ED Notes (Signed)
Tried to clean pt again with DeShaunte NT however pt started having 3rd episode of watery stool, Dr. Ander Slade made aware. Request made for flexiseal.

## 2020-05-28 NOTE — Progress Notes (Addendum)
[facility] NEPHROLOGY PROGRESS NOTE  Assessment/ Plan: Pt is a 70 y.o. yo male  HTN, DM II, ESRD on HD MWF, CHF, Afib on Apixaban, and dx of COVID on 05/22/20, transferred from [facility] for hypoxia, shock, seen as a consultation for management of ESRD. Dialyzes at [facility] MWF  3.5 hours, 2K bath, EDW 70.5 kg, no heparin bolus with treatment, AVF for the access, also has RIJ TDC.   #Acute hypoxic respiratory failure, ARDS due to COVID-pneumonia: Currently intubated, sedated.  Full ventilatory support with FiO2 100%.  Per pulmonary team.  Chest x-ray with bilateral pleural effusion.  #Septic shock with multiorgan failure, lactic acidosis: Currently on phenylephrine and Levophed.  SBP around 100.  Volume up on exam.  # ESRD on HD, missed dialysis yesterday for being in the hospital.  Now with volume overload.  No IHD because of shock therefore plan for CRRT, try UF as tolerated.  Already has right IJ [facility] for access.  I tried to call his wife for updates without answer.  Discussed with PCCM team.  Starting CRRT for medical emergency nature. All 4k, prefilter 500, post-filter 300 and dialysate 1500. PTT high so no heparin.   # Anemia: Hemoglobin at goal.  Monitor.  # Secondary hyperparathyroidism: Starting CRRT.  Currently NPO.  #Acute metabolic encephalopathy: Currently sedated.  Subjective: Seen and examined in ICU.  On COVID isolation.  Currently sedated and intubated therefore unable to obtain review of system. Objective Vital signs in last 24 hours: Vitals:   05/28/20 1215 05/28/20 1253 05/28/20 1300 05/28/20 1325  BP: 100/80  (!) 80/62 104/79  Pulse: (!) 115  (!) 111 (!) 106  Resp: (!) 23  (!) 35 (!) 35  Temp:      TempSrc:      SpO2: 97% 98% 97% 98%  Weight:      Height:       Weight change:   Intake/Output Summary (Last 24 hours) at 05/28/2020 1441 Last data filed at 05/28/2020 1200 Gross per 24 hour  Intake 3911.11 ml  Output --  Net 3911.11 ml        Labs: Basic Metabolic Panel: Recent Labs  Lab 06/05/2020 1252 05/25/2020 2311 06/06/2020 2315 05/28/20 0251 05/28/20 0457 05/28/20 0851 05/28/20 1144  NA 137   < >  --  138 137 136 138  K 5.2*   < >  --  4.3 4.4 4.2 4.5  CL 94*  --   --  100  --   --  97*  CO2 21*  --   --  15*  --   --  19*  GLUCOSE 96  --   --  188*  --   --  93  BUN 77*  --   --  79*  --   --  83*  CREATININE 7.78*  --   --  7.40*  --   --  7.09*  CALCIUM 9.1  --   --  7.1*  --   --  6.5*  PHOS  --   --  10.6* 10.1*  --   --  9.5*   < > = values in this interval not displayed.   Liver Function Tests: Recent Labs  Lab 05/25/2020 1252 05/28/20 0251 05/28/20 1144  AST 86* 965*  --   ALT 39 304*  --   ALKPHOS 114 77  --   BILITOT 1.0 1.1  --   PROT 7.5 5.0*  --   ALBUMIN 3.2* 2.0*  2.0*   No results for input(s): LIPASE, AMYLASE in the last 168 hours. No results for input(s): AMMONIA in the last 168 hours. CBC: Recent Labs  Lab 05/22/20 0409 05/26/2020 1252 06/12/2020 2311 05/28/20 0251 05/28/20 0457 05/28/20 0851  WBC 3.3* 3.7*  --  4.0  --   --   NEUTROABS 2.1 2.8  --   --   --   --   HGB 10.3* 12.5*   < > 10.7* 12.2* 10.5*  HCT 33.1* 40.9   < > 35.4* 36.0* 31.0*  MCV 110.7* 111.4*  --  112.7*  --   --   PLT 166 170  --  127*  --   --    < > = values in this interval not displayed.   Cardiac Enzymes: No results for input(s): CKTOTAL, CKMB, CKMBINDEX, TROPONINI in the last 168 hours. CBG: Recent Labs  Lab 06/09/2020 2313 05/28/20 0000 05/28/20 0310 05/28/20 0433 05/28/20 0739  GLUCAP 84 84 126* 149* 112*    Iron Studies: No results for input(s): IRON, TIBC, TRANSFERRIN, FERRITIN in the last 72 hours. Studies/Results: DG Abdomen 1 View  Result Date: 05/30/2020 CLINICAL DATA:  Orogastric tube placement. EXAM: ABDOMEN - 1 VIEW COMPARISON:  May 27, 2020 (9:04 p.m.) FINDINGS: A nasogastric tube is seen with its distal tip overlying the body of the stomach. Its distal tip sits  approximately 10 cm distal to the gastroesophageal junction. Stable areas of airspace disease and pleural effusions are seen within the visualized portions of both lungs. The bowel gas pattern is normal. No radio-opaque calculi or other significant radiographic abnormality are seen. IMPRESSION: Nasogastric tube positioning, as described above. Electronically Signed   By: Virgina Norfolk M.D.   On: 06/04/2020 22:55   DG Abd 1 View  Result Date: 06/12/2020 CLINICAL DATA:  Enteric catheter placement EXAM: ABDOMEN - 1 VIEW COMPARISON:  06/04/2020 FINDINGS: Frontal view of the lower chest and upper abdomen demonstrates enteric catheter coiled over the gastric fundus. Bowel gas pattern is unremarkable. No masses or abnormal calcifications. Extensive vascular calcifications are noted. Bibasilar consolidation and effusions are unchanged. IMPRESSION: 1. Enteric catheter coiled over gastric fundus. Electronically Signed   By: Randa Ngo M.D.   On: 05/24/2020 21:12   DG Chest Port 1 View  Result Date: 05/28/2020 CLINICAL DATA:  Respiratory failure and COVID EXAM: PORTABLE CHEST 1 VIEW COMPARISON:  Yesterday FINDINGS: Endotracheal tube with tip just below the clavicular heads. The enteric tube reaches the stomach. Dialysis catheter with tip at the upper right atrium. Left subclavian line with tip at the SVC. Hazy bilateral chest opacification with pleural effusions that are small appearing on the left and more moderate on the right. Negative for pneumothorax. Cardiomegaly. IMPRESSION: Unchanged hardware positioning, confluent airspace disease, and right more than left pleural effusion. Electronically Signed   By: Monte Fantasia M.D.   On: 05/28/2020 04:40   DG CHEST PORT 1 VIEW  Result Date: 06/02/2020 CLINICAL DATA:  Central line placement, intubated EXAM: PORTABLE CHEST 1 VIEW COMPARISON:  06/16/2020 FINDINGS: 2 frontal views of the chest demonstrate endotracheal tube overlying tracheal air column tip at level  of thoracic inlet. Enteric catheter passes below diaphragm, tip excluded by collimation. Stable right internal jugular dialysis catheter. New left internal jugular catheter tip projecting over superior vena cava. The cardiac silhouette is enlarged but stable. Persistent bibasilar airspace disease, left greater than right. Bilateral pleural effusions, right greater than left. No evidence of pneumothorax. IMPRESSION: 1. Support devices as above.  No complication after central venous catheter placement. 2. Persistent bibasilar airspace disease and bilateral pleural effusions, without significant change since exam from earlier today. Electronically Signed   By: Randa Ngo M.D.   On: 06/08/2020 21:11   DG Chest Port 1 View  Result Date: 06/17/2020 CLINICAL DATA:  Shortness of breath. EXAM: PORTABLE CHEST 1 VIEW COMPARISON:  05/22/2020 FINDINGS: Again noted is a right jugular dialysis catheter with the tip near the superior cavoatrial junction. New airspace densities in the mid and lower left chest. Evidence for new right perihilar densities. Evidence for chronic right pleural effusion. Lower chest is incompletely imaged. Stable cardiac enlargement. IMPRESSION: 1. New bilateral airspace disease. Differential diagnosis includes asymmetric pulmonary edema versus bilateral pneumonia. 2. Persistent right pleural effusion that is incompletely imaged. 3. Stable cardiomegaly. 4. Stable position of the dialysis catheter. Electronically Signed   By: Markus Daft M.D.   On: 06/01/2020 13:13    Medications: Infusions: . sodium chloride    . ceFEPime (MAXIPIME) IV    . dexmedetomidine (PRECEDEX) IV infusion 0.7 mcg/kg/hr (05/28/20 1200)  . fentaNYL infusion INTRAVENOUS 75 mcg/hr (05/26/2020 2243)  . norepinephrine (LEVOPHED) Adult infusion 30 mcg/min (05/28/20 1434)  . phenylephrine (NEO-SYNEPHRINE) Adult infusion 40 mcg/min (05/28/20 1322)  .  sodium bicarbonate (isotonic) infusion in sterile water 125 mL/hr at 05/28/20  1200    Scheduled Medications: . atorvastatin  40 mg Per Tube Daily  . calcitRIOL  0.25 mcg Per Tube Daily  . calcium acetate  667 mg Oral TID with meals  . chlorhexidine gluconate (MEDLINE KIT)  15 mL Mouth Rinse BID  . Chlorhexidine Gluconate Cloth  6 each Topical Daily  . dexamethasone (DECADRON) injection  6 mg Intravenous Q24H  . docusate  100 mg Per Tube BID  . feeding supplement (PROSource TF)  45 mL Per Tube BID  . feeding supplement (VITAL HIGH PROTEIN)  1,000 mL Per Tube Q24H  . folic acid  1 mg Per Tube Daily  . insulin aspart  0-6 Units Subcutaneous Q4H  . mouth rinse  15 mL Mouth Rinse 10 times per day  . midodrine  5 mg Per Tube TID WC  . multivitamin with minerals  1 tablet Per Tube Daily  . pantoprazole (PROTONIX) IV  40 mg Intravenous Q12H  . polyethylene glycol  17 g Per Tube Daily  . thiamine  100 mg Per Tube Daily  . thyroid  90 mg Per Tube Daily  . vancomycin variable dose per unstable renal function (pharmacist dosing)   Does not apply See admin instructions    have reviewed scheduled and prn medications.  Physical Exam: General: Critically ill looking male, intubated and sedated Heart:RRR, s1s2 nl Lungs: Coarse breath sound bilateral Abdomen:soft, Non-tender, distended abdomen Extremities: Bilateral lower extremity edema Dialysis Access: Right IJ TDC, left upper extremity AV fistula with good thrill  Dron Tanna Furry 05/28/2020,2:41 PM  LOS: 1 day  Pager: [phone]

## 2020-05-28 NOTE — ED Notes (Signed)
Cleaned pt and placed flexiseal

## 2020-05-28 NOTE — Progress Notes (Addendum)
ANTICOAGULATION CONSULT NOTE - Follow Up Consult  Pharmacy Consult for heparin Indication: Afib and h/o VTE  Labs: Recent Labs    05/23/2020 1252 06/16/2020 1508 05/28/20 0052 05/28/20 0251 05/28/20 0457 05/28/20 0851 05/28/20 1144 05/28/20 1700 05/28/20 1735 05/28/20 1800  HGB 12.5*   < >  --  10.7* 12.2* 10.5*  --   --   --   --   HCT 40.9   < >  --  35.4* 36.0* 31.0*  --   --   --   --   PLT 170  --   --  127*  --   --   --   --   --   --   APTT 31   < > >200*  --   --   --  >200* 128*  --   --   LABPROT 14.1  --   --   --   --   --   --   --   --   --   INR 1.1  --   --   --   --   --   --   --   --   --   HEPARINUNFRC  --    < > 1.38*  --   --   --  1.36*  --   --  1.09*  CREATININE 7.78*  --   --  7.40*  --   --  7.09* 6.05* 6.80*  --    < > = values in this interval not displayed.    Assessment: 38 yoM on heparin for AFib while home Eliquis on hold admitted with COVID-19 and shock. Heparin level and aPTT have both remained elevated after holding heparin x3 hours. Heparin likely elevated from recent DOAC use and aPTT likely from shock liver and acute illness coagulopathy. Will continue to hold anticoagulation and recheck coags 3h after last check.  Goal of Therapy:  aPTT 66-102 seconds   Plan:  -Continue to hold heparin for now -Recheck aPTT and heparin level in 1h   ADDENDUM: repeat coag labs now beginning to normalize with aPTT 86 seconds and heparin level 0.87. These values were drawn ~1.5 hours ago and have likely continued to normalize as heparin is cleared further. Will restart heparin at significantly lower rate than earlier and watch labs closely. No bleeding noted this shift per nursing. Will repeat labs in 6h instead of 8h due to previous extremely high levels.  Plan: -Restart heparin 450 units/h -Check aPTT and heparin level in 8h -Monitor for S/Sx bleeding closely   Arrie Senate, PharmD, BCPS, [facility] Clinical Pharmacist [phone] Please check AMION for  all [place] numbers 05/28/2020

## 2020-05-28 NOTE — ED Notes (Signed)
bair hugger taken off pt, temp 97.4 (thru temp sensing foley).

## 2020-05-28 NOTE — ED Notes (Signed)
pts temp is 95.9 thru temp sensing foley, placed on bair hugger with 32C, Dr.Olalere made aware.

## 2020-05-28 NOTE — Progress Notes (Signed)
Attempt again made to reach out to wife. Went directly to voice mail -did not leave message. Will attempt later  Erick Colace ACNP-BC [place] Pager # [phone] OR # [phone] if no answer

## 2020-05-28 NOTE — Progress Notes (Signed)
Initial Nutrition Assessment  DOCUMENTATION CODES:   Not applicable  INTERVENTION:   Per discussed with CCM, plan to hold off on TF at present time. Vital High Protein and Pro-Source orders discontinued. Also discontinued the scheduled colace and miralax given diarrhea  Tube Feeding Recommendations when appropriate: Vital AF 1.2 at 60 ml/hr Begin TF at 20 ml/hr; titrate by 10 mL q 8 hours until goal rate of 60 ml/hr Pro-Srouce TF 45 mL BID Provides 130 g of protein, 1808 kcals and 1166 mL of free water Meets 100% estimated calorie and protein needs  Add MVI with Minerals x 2 (with hx of bariatric surgery per surgical hx)  NUTRITION DIAGNOSIS:   Inadequate oral intake related to acute illness as evidenced by NPO status.  GOAL:   Patient will meet greater than or equal to 90% of their needs  MONITOR:   Vent status,Labs,Weight trends,TF tolerance  REASON FOR ASSESSMENT:   Consult,Ventilator Enteral/tube feeding initiation and management  ASSESSMENT:   70 yo male admitted with septic shock secondary to ARDS from COVID-19 pneumonia requiring intubation, missed HD due to admission to hospital, plan for CRRT. PMH includes ESRD on HD, PVD, HTN, CHF, lymphedema, DM, bariatric surgery (?) listed in surgical hx   12/31 Initial PCR +COVID-19 01/05 Admitted, intubated  Pt remains sedated on vent support; possible plan to prone if does not improve. Requiring pressors (levophed up to 30 mcg/min, phenylephrine 40 mcg/min) Noted plan to initiate CRRT today  C/S received for TF with Vital High Protein ordered. +multiple watery stools, C.diff pending.  Noted concern for ischemic gut given rise in lactic acid, liquid stool and overall clinical decline.   EDW 70.5 kg; Current wt 72.6 kg Unable to obtain diet and weight history at this time  Hyperphosphatemic with missed HD, should improve with initiation of CRRT  Labs: phosphorus 9.5 (H) Meds: miralax, colace   Diet Order:    Diet Order            Diet NPO time specified Except for: Sips with Meds  Diet effective now                 EDUCATION NEEDS:   Not appropriate for education at this time  Skin:  Skin Assessment: Skin Integrity Issues: Skin Integrity Issues:: DTI,Stage I DTI: buttocks Stage I: upper back  Last BM:  1/6  Height:   Ht Readings from Last 1 Encounters:  06/12/2020 5\' 11"  (1.803 m)    Weight:   Wt Readings from Last 1 Encounters:  06/18/2020 72.6 kg   BMI:  Body mass index is 22.32 kg/m.  Estimated Nutritional Needs:   Kcal:  7616-0737 kcals  Protein:  125-150 g  Fluid:  >/= 1.7 L    Kerman Passey MS, RDN, LDN, CNSC Registered Dietitian III Clinical Nutrition RD Pager and On-Call Pager Number Located in [place]

## 2020-05-28 NOTE — ED Notes (Signed)
FLEXI SEAL ordered @ [employer], RN called by Levada Dy

## 2020-05-28 NOTE — Progress Notes (Addendum)
ANTICOAGULATION CONSULT NOTE - Follow Up Consult  Pharmacy Consult for heparin Indication: Afib and h/o VTE  Labs: Recent Labs    06/07/2020 1252 06/11/2020 1508 06/20/2020 2311  HGB 12.5*  --  13.6  HCT 40.9  --  40.0  PLT 170  --   --   APTT 31  --   --   LABPROT 14.1  --   --   INR 1.1  --   --   HEPARINUNFRC  --  1.46*  --   CREATININE 7.78*  --   --     Assessment: 70yo male supratherapeutic on heparin with initial dosing while Eliquis on hold; no gtt issues or signs of bleeding per RN.  Labs currently not crossing to Epic; heparin level 1.38, PTT >200; drawn from CVC, heparin running via PIV.  Goal of Therapy:  aPTT 66-102 seconds   Plan:  Will hold heparin gtt x53min then decrease heparin gtt by 4 units/kg/hr to 800 units/hr and check level in 8 hours.    Wynona Neat, PharmD, BCPS  05/28/2020,12:57 AM

## 2020-05-28 NOTE — Progress Notes (Signed)
RT assisted with transportation of pt from ED to [facility] while on full ventilatory support. Pt tolerated well.

## 2020-05-29 ENCOUNTER — Inpatient Hospital Stay (HOSPITAL_COMMUNITY): Payer: Medicare HMO

## 2020-05-29 DIAGNOSIS — U071 COVID-19: Secondary | ICD-10-CM | POA: Diagnosis not present

## 2020-05-29 DIAGNOSIS — Z515 Encounter for palliative care: Secondary | ICD-10-CM

## 2020-05-29 DIAGNOSIS — J9601 Acute respiratory failure with hypoxia: Secondary | ICD-10-CM | POA: Diagnosis not present

## 2020-05-29 DIAGNOSIS — J1282 Pneumonia due to coronavirus disease 2019: Secondary | ICD-10-CM | POA: Diagnosis not present

## 2020-05-29 LAB — RENAL FUNCTION PANEL
Albumin: 1.9 g/dL — ABNORMAL LOW (ref 3.5–5.0)
Albumin: 1.8 g/dL — ABNORMAL LOW (ref 3.5–5.0)
Albumin: 1.7 g/dL — ABNORMAL LOW (ref 3.5–5.0)
Anion gap: 17 — ABNORMAL HIGH (ref 5–15)
Anion gap: 22 — ABNORMAL HIGH (ref 5–15)
Anion gap: 23 — ABNORMAL HIGH (ref 5–15)
BUN: 46 mg/dL — ABNORMAL HIGH (ref 8–23)
BUN: 35 mg/dL — ABNORMAL HIGH (ref 8–23)
BUN: 26 mg/dL — ABNORMAL HIGH (ref 8–23)
CO2: 20 mmol/L — ABNORMAL LOW (ref 22–32)
CO2: 16 mmol/L — ABNORMAL LOW (ref 22–32)
CO2: 13 mmol/L — ABNORMAL LOW (ref 22–32)
Calcium: 6.4 mg/dL — CL (ref 8.9–10.3)
Calcium: 6.4 mg/dL — CL (ref 8.9–10.3)
Calcium: 6.2 mg/dL — CL (ref 8.9–10.3)
Chloride: 99 mmol/L (ref 98–111)
Chloride: 95 mmol/L — ABNORMAL LOW (ref 98–111)
Chloride: 96 mmol/L — ABNORMAL LOW (ref 98–111)
Creatinine, Ser: 3.72 mg/dL — ABNORMAL HIGH (ref 0.61–1.24)
Creatinine, Ser: 3.14 mg/dL — ABNORMAL HIGH (ref 0.61–1.24)
Creatinine, Ser: 2.14 mg/dL — ABNORMAL HIGH (ref 0.61–1.24)
GFR, Estimated: 17 mL/min — ABNORMAL LOW (ref >60)
GFR, Estimated: 21 mL/min — ABNORMAL LOW (ref >60)
GFR, Estimated: 33 mL/min — ABNORMAL LOW (ref >60)
Glucose, Bld: 70 mg/dL (ref 70–99)
Glucose, Bld: 95 mg/dL (ref 70–99)
Glucose, Bld: 82 mg/dL (ref 70–99)
Phosphorus: 6.2 mg/dL — ABNORMAL HIGH (ref 2.5–4.6)
Phosphorus: 6.8 mg/dL — ABNORMAL HIGH (ref 2.5–4.6)
Phosphorus: 6.2 mg/dL — ABNORMAL HIGH (ref 2.5–4.6)
Potassium: 5.1 mmol/L (ref 3.5–5.1)
Potassium: 5.5 mmol/L — ABNORMAL HIGH (ref 3.5–5.1)
Potassium: 5.3 mmol/L — ABNORMAL HIGH (ref 3.5–5.1)
Sodium: 136 mmol/L (ref 135–145)
Sodium: 133 mmol/L — ABNORMAL LOW (ref 135–145)
Sodium: 132 mmol/L — ABNORMAL LOW (ref 135–145)

## 2020-05-29 LAB — CBC
HCT: 31.4 % — ABNORMAL LOW (ref 39.0–52.0)
Hemoglobin: 10.2 g/dL — ABNORMAL LOW (ref 13.0–17.0)
MCH: 34.6 pg — ABNORMAL HIGH (ref 26.0–34.0)
MCHC: 32.5 g/dL (ref 30.0–36.0)
MCV: 106.4 fL — ABNORMAL HIGH (ref 80.0–100.0)
Platelets: 65 10*3/uL — ABNORMAL LOW (ref 150–400)
RBC: 2.95 MIL/uL — ABNORMAL LOW (ref 4.22–5.81)
RDW: 14.6 % (ref 11.5–15.5)
WBC: 9.2 10*3/uL (ref 4.0–10.5)
nRBC: 0 % (ref 0.0–0.2)

## 2020-05-29 LAB — GLUCOSE, CAPILLARY
Glucose-Capillary: 59 mg/dL — ABNORMAL LOW (ref 70–99)
Glucose-Capillary: 73 mg/dL (ref 70–99)
Glucose-Capillary: 81 mg/dL (ref 70–99)
Glucose-Capillary: 74 mg/dL (ref 70–99)
Glucose-Capillary: 75 mg/dL (ref 70–99)
Glucose-Capillary: 90 mg/dL (ref 70–99)

## 2020-05-29 LAB — POCT I-STAT 7, (LYTES, BLD GAS, ICA,H+H)
Acid-base deficit: 5 mmol/L — ABNORMAL HIGH (ref 0.0–2.0)
Bicarbonate: 22.1 mmol/L (ref 20.0–28.0)
Calcium, Ion: 0.84 mmol/L — CL (ref 1.15–1.40)
HCT: 31 % — ABNORMAL LOW (ref 39.0–52.0)
Hemoglobin: 10.5 g/dL — ABNORMAL LOW (ref 13.0–17.0)
O2 Saturation: 97 %
Patient temperature: 94.6
Potassium: 4.5 mmol/L (ref 3.5–5.1)
Sodium: 135 mmol/L (ref 135–145)
TCO2: 24 mmol/L (ref 22–32)
pCO2 arterial: 43.7 mmHg (ref 32.0–48.0)
pH, Arterial: 7.3 — ABNORMAL LOW (ref 7.350–7.450)
pO2, Arterial: 93 mmHg (ref 83.0–108.0)

## 2020-05-29 LAB — HEPARIN LEVEL (UNFRACTIONATED)
Heparin Unfractionated: 0.81 IU/mL — ABNORMAL HIGH (ref 0.30–0.70)
Heparin Unfractionated: 0.58 IU/mL (ref 0.30–0.70)

## 2020-05-29 LAB — HEPATIC FUNCTION PANEL
ALT: 649 U/L — ABNORMAL HIGH (ref 0–44)
AST: 1561 U/L — ABNORMAL HIGH (ref 15–41)
Albumin: 1.8 g/dL — ABNORMAL LOW (ref 3.5–5.0)
Alkaline Phosphatase: 85 U/L (ref 38–126)
Bilirubin, Direct: 1.2 mg/dL — ABNORMAL HIGH (ref 0.0–0.2)
Indirect Bilirubin: 0.9 mg/dL (ref 0.3–0.9)
Total Bilirubin: 2.1 mg/dL — ABNORMAL HIGH (ref 0.3–1.2)
Total Protein: 4.6 g/dL — ABNORMAL LOW (ref 6.5–8.1)

## 2020-05-29 LAB — APTT
aPTT: 108 seconds — ABNORMAL HIGH (ref 24–36)
aPTT: 152 seconds — ABNORMAL HIGH (ref 24–36)

## 2020-05-29 LAB — LACTIC ACID, PLASMA: Lactic Acid, Venous: >11 mmol/L (ref 0.5–1.9)

## 2020-05-29 LAB — MAGNESIUM: Magnesium: 2.1 mg/dL (ref 1.7–2.4)

## 2020-05-29 MED ORDER — DEXTROSE 50 % IV SOLN
1.0000 | Freq: Once | INTRAVENOUS | Status: AC
  Administered 2020-05-29: 50 mL via INTRAVENOUS

## 2020-05-29 MED ORDER — ADULT MULTIVITAMIN W/MINERALS CH: 1.0000 | ORAL_TABLET | Freq: Two times a day (BID) | ORAL | Status: DC

## 2020-05-29 MED ORDER — VITAL AF 1.2 CAL PO LIQD
1000.0000 mL | ORAL | Status: DC
  Administered 2020-05-29: 1000 mL

## 2020-05-29 MED ORDER — DIPHENHYDRAMINE HCL 50 MG/ML IJ SOLN: 25.0000 mg | INTRAMUSCULAR | Status: DC | PRN

## 2020-05-29 MED ORDER — CALCIUM GLUCONATE-NACL 1-0.675 GM/50ML-% IV SOLN
1.0000 g | Freq: Once | INTRAVENOUS | Status: AC
  Administered 2020-05-29: 1000 mg via INTRAVENOUS
  Filled 2020-05-29: qty 50

## 2020-05-29 MED ORDER — PRISMASOL BGK 0/2.5 32-2.5 MEQ/L EC SOLN
Status: DC
  Filled 2020-05-29 (×6): qty 5000

## 2020-05-29 MED ORDER — PRISMASOL BGK 0/2.5 32-2.5 MEQ/L EC SOLN
Status: DC
  Filled 2020-05-29 (×3): qty 5000

## 2020-05-29 MED ORDER — SODIUM CHLORIDE 0.9 % IV SOLN
1.0000 g | Freq: Three times a day (TID) | INTRAVENOUS | Status: DC
  Administered 2020-05-29: 1 g via INTRAVENOUS
  Filled 2020-05-29 (×4): qty 1

## 2020-05-29 MED ORDER — DEXTROSE 5 % IV SOLN: INTRAVENOUS | Status: DC

## 2020-05-29 MED ORDER — ACETAMINOPHEN 325 MG PO TABS: 650.0000 mg | ORAL_TABLET | Freq: Four times a day (QID) | ORAL | Status: DC | PRN

## 2020-05-29 MED ORDER — DEXTROSE 50 % IV SOLN
INTRAVENOUS | Status: AC
  Filled 2020-05-29: qty 50

## 2020-05-29 MED ORDER — GLYCOPYRROLATE 0.2 MG/ML IJ SOLN: 0.2000 mg | INTRAMUSCULAR | Status: DC | PRN

## 2020-05-29 MED ORDER — DEXTROSE 10 % IV SOLN
INTRAVENOUS | Status: DC
  Administered 2020-05-29: 50 mL/h via INTRAVENOUS

## 2020-05-29 MED ORDER — VASOPRESSIN 20 UNITS/100 ML INFUSION FOR SHOCK
0.0000 [IU]/min | INTRAVENOUS | Status: DC
  Administered 2020-05-29 (×2): 0.03 [IU]/min via INTRAVENOUS
  Filled 2020-05-29 (×2): qty 100

## 2020-05-29 MED ORDER — PROSOURCE TF PO LIQD
45.0000 mL | Freq: Two times a day (BID) | ORAL | Status: DC
  Administered 2020-05-29: 45 mL
  Filled 2020-05-29: qty 45

## 2020-05-29 MED ORDER — FENTANYL CITRATE (PF) 100 MCG/2ML IJ SOLN: 50.0000 ug | INTRAMUSCULAR | Status: DC | PRN

## 2020-05-29 MED ORDER — ACETAMINOPHEN 650 MG RE SUPP: 650.0000 mg | Freq: Four times a day (QID) | RECTAL | Status: DC | PRN

## 2020-05-29 MED ORDER — GLYCOPYRROLATE 1 MG PO TABS: 1.0000 mg | ORAL_TABLET | ORAL | Status: DC | PRN

## 2020-05-29 MED ORDER — POLYVINYL ALCOHOL 1.4 % OP SOLN
1.0000 [drp] | Freq: Four times a day (QID) | OPHTHALMIC | Status: DC | PRN
  Filled 2020-05-29: qty 15

## 2020-05-29 MED ORDER — FENTANYL 2500MCG IN NS 250ML (10MCG/ML) PREMIX INFUSION
0.0000 ug/h | INTRAVENOUS | Status: DC
  Administered 2020-05-29: 50 ug/h via INTRAVENOUS

## 2020-05-29 MED ORDER — HYDROCORTISONE NA SUCCINATE PF 100 MG IJ SOLR
50.0000 mg | Freq: Four times a day (QID) | INTRAMUSCULAR | Status: DC
  Administered 2020-05-29 (×2): 50 mg via INTRAVENOUS
  Filled 2020-05-29 (×2): qty 2

## 2020-05-29 MED ORDER — HEPARIN (PORCINE) 25000 UT/250ML-% IV SOLN
200.0000 [IU]/h | INTRAVENOUS | Status: DC
  Filled 2020-05-29: qty 250

## 2020-05-29 MED ORDER — FENTANYL BOLUS VIA INFUSION
100.0000 ug | INTRAVENOUS | Status: DC | PRN
  Filled 2020-05-29: qty 100

## 2020-05-29 MED ORDER — PRISMASOL BGK 0/2.5 32-2.5 MEQ/L EC SOLN
Status: DC
  Filled 2020-05-29 (×9): qty 5000

## 2020-05-30 LAB — CULTURE, BLOOD (SINGLE)

## 2020-06-01 NOTE — Discharge Summary (Signed)
Physician Death Summary  Patient ID: [name] MRN: [id number] DOB/AGE: [birth year] 70 y.o.  Admit date: 06/05/2020 Discharge date: Jun 22, 2020  Admission Diagnoses: COVID-19 pneumonia  Discharge Diagnoses:  Septic shock present on admission Multiorgan failure E. coli bacteremia Hospital-acquired pneumonia ARDS secondary to COVID-19 End-stage renal disease Atrial fibrillation Congestive heart failure Hyperlipidemia Hypothyroidism Acute metabolic encephalopathy from sepsis, hypoxia  Discharged Condition: Deceased  Hospital Course:  71 year old with ESRD on HD, Combined CHF, A fib on eliquis, PE in 2020, CVA, Vit D deficiency, OSA on CPAP, Lymphedema, Hypothyroidism, HLD, DM type 2.  Admitted with severe COVID-19 pneumonia, septic shock with E. coli bacteremia.  Transfer to ICU on ventilator, started on CRRT.  He continued to deteriorate with worsening septic shock, multiorgan failure, lactic acidosis greater than 11.  Due to refractory shock family was called and poor prognosis discussed.  They requested transition to comfort care.  He was extubated on June 22, 2020 and passed away shortly afterwards.  SignedMarshell Garfinkel 06/01/2020, 6:02 PM

## 2020-06-08 ENCOUNTER — Ambulatory Visit: Payer: Medicare HMO | Admitting: Vascular Surgery

## 2020-06-20 DIAGNOSIS — G4733 Obstructive sleep apnea (adult) (pediatric): Secondary | ICD-10-CM | POA: Diagnosis not present

## 2020-06-23 NOTE — Progress Notes (Signed)
Called and spoke w/Melissa Domingo Cocking at [employer] to notify them of cardiac time of death of Aug 28, 2216.  Pt is not suitable for any organ or tissue donation.

## 2020-06-23 NOTE — Progress Notes (Signed)
   Palliative Medicine Inpatient Follow Up Note   I have reached out to the Intensivist Team - Dr. Vaughan Browner, he had already conducted a formal goals of care meeting. At this point in time our services are not needed per our brief secure chat conversation. The consult will be cancelled.   Please do not hesitate to reach out to the Palliative Care team if additional support is needed. ______________________________________________________________________________________ [place] Team Team Cell Phone: [phone] Please utilize secure chat with additional questions, if there is no response within 30 minutes please call the above phone number  Palliative Medicine Team providers are available by phone from 7am to 7pm daily and can be reached through the team cell phone.  Should this patient require assistance outside of these hours, please call the patient's attending physician.

## 2020-06-23 NOTE — Progress Notes (Signed)
[place] Progress Note Patient Name: Lawrence Bonilla DOB: 07/17/1950 MRN: [id number]   Date of Service  06/28/20  HPI/Events of Note  Hypocalcemia - Ca++ = 6.4 which corrects to 7.88 (Low) given albumin = 1.9. Ca++(7.88) XPO4 (6.2) --- double product in 48 range.   eICU Interventions  Will replace Ca++.     Intervention Category Major Interventions: Electrolyte abnormality - evaluation and management  Keerstin Bjelland Eugene Jun 28, 2020, 5:45 AM

## 2020-06-23 NOTE — Progress Notes (Signed)
ANTICOAGULATION CONSULT NOTE - Follow Up Consult  Pharmacy Consult for heparin Indication: Afib and h/o VTE  Labs: Recent Labs    05/25/2020 1252 06/18/2020 1508 05/28/20 0251 05/28/20 0457 05/28/20 1700 05/28/20 1735 05/28/20 1800 05/28/20 1950 05/28/20 2030 06-17-2020 0055 Jun 17, 2020 0418  HGB 12.5*   < > 10.7*   < >  --   --   --  10.4*  --  10.5* 10.2*  HCT 40.9   < > 35.4*   < >  --   --   --  31.9*  --  31.0* 31.4*  PLT 170  --  127*  --   --   --   --  PLATELET CLUMPS NOTED ON SMEAR, UNABLE TO ESTIMATE  --   --  65*  APTT 31   < >  --    < > 128*  --   --   --  86*  --  108*  LABPROT 14.1  --   --   --   --   --   --   --   --   --   --   INR 1.1  --   --   --   --   --   --   --   --   --   --   HEPARINUNFRC  --    < >  --    < >  --   --  1.09*  --  0.87*  --  0.81*  CREATININE 7.78*  --  7.40*   < > 6.05* 6.80*  --   --   --   --  3.72*   < > = values in this interval not displayed.    Assessment: 50 yoM on heparin for AFib while home Eliquis on hold admitted with COVID-19 and shock. Heparin level and aPTT have both remained elevated after holding heparin x3 hours. Heparin likely elevated from recent DOAC use and aPTT likely from shock liver and acute illness coagulopathy.   Heparin level and APTT elevated on low dose heparin, likely due to recent apixaban and shock liver.   Goal of Therapy:  aPTT 66-85 seconds   Plan:  -Hold heparin for 1 hour -Then decrease heparin to 300 units/hr -Recheck aPTT and heparin level in 6 hours -Monitor for S/Sx bleeding closely   Norina Buzzard, PharmD PGY1 Pharmacy Resident 06/17/2020 7:48 AM   Please check AMION for all [place] numbers

## 2020-06-23 NOTE — Progress Notes (Signed)
[place] Progress Note Patient Name: Lawrence Bonilla DOB: 05/08/1951 MRN: [id number]   Date of Service  2020-06-07  HPI/Events of Note  Hypotension - BP = 60/40 on Norepinephrine IV infusion at 40 mcg/min and Phenylephrine IV infusion at 400 mcg/min. Hypoglycemia - Blood glucose = 48.   eICU Interventions  Plan: 1. Monitor CVP now and Q 4 hours. 2. Vasopressin IV infusion at shock dose.  3. Increase ceiling on Norepinephrine IV infusion to 60 mcg/min.  4. ABG STAT. 5. D10W to run IV at 50 mL/hour.      Intervention Category Major Interventions: Hypotension - evaluation and management  Lacole Komorowski Eugene Jun 07, 2020, 12:22 AM

## 2020-06-23 NOTE — Progress Notes (Addendum)
NAME:  Lawrence Bonilla, MRN:  [id number], DOB:  Feb 28, 1951, LOS: 2 ADMISSION DATE:  06/13/2020, CONSULTATION DATE: 05/27/2020 REFERRING MD:  Dr. Roderic Palau, ER, CHIEF COMPLAINT:  Respiratory failure   Brief History:  70 year old with end-stage renal disease admitted with severe COVID-19 pneumonia, septic shock, gram-negative rod bacteremia.  Past Medical History:  ESRD on HD, Combined CHF, A fib on eliquis, PE in 2020, CVA, Vit D deficiency, OSA on CPAP, Lymphedema, Hypothyroidism, HLD, DM type 2  Significant Hospital Events:  1/05: Admit, intubated after failing Bipap 1/6: In refractory shock on high-dose pressors.  Mottled.  Remains on high FiO2 and PEEP.  Transfer to intensive care  Consults:    Procedures:  1/5 intubation 1/5 left IJ triple-lumen catheter.  Significant Diagnostic Tests:    Micro Data:  COVID 05/22/20 >> Positive Flu 05/22/20 >> negative Blood 02/24/21 >> GNR  COVID Therapy:  Decadron 1/05 >> Tocilizumab 1/05  Antimicrobials:  Vancomycin 1/05 >> 1/6 Cefepime 1/05 >>  Interim History / Subjective:   Remains on high-dose pressors, bicarb and heparin drip.  On D10 for hypoglycemia  Objective   Blood pressure 114/68, pulse 73, temperature 98.3 F (36.8 C), temperature source Axillary, resp. rate (!) 21, height 5\' 11"  (1.803 m), weight 78.5 kg, SpO2 (!) 80 %. CVP:  [3 mmHg-14 mmHg] 14 mmHg  Vent Mode: PRVC FiO2 (%):  [90 %-100 %] 90 % Set Rate:  [35 bmp] 35 bmp Vt Set:  [500 mL] 500 mL PEEP:  [10 cmH20] 10 cmH20 Plateau Pressure:  [20 cmH20-28 cmH20] 20 cmH20   Intake/Output Summary (Last 24 hours) at Jun 20, 2020 0801 Last data filed at 06-20-2020 0700 Gross per 24 hour  Intake 8671.2 ml  Output 780 ml  Net 7891.2 ml   Filed Weights   05/24/2020 1227 06/20/2020 1315 20-Jun-2020 0500  Weight: 72.6 kg 72.6 kg 78.5 kg    Examination: Blood pressure 114/68, pulse 74, temperature 98.3 F (36.8 C), temperature source Axillary, resp. rate (!) 31, height 5\' 11"   (1.803 m), weight 78.5 kg, SpO2 (!) 61 %. Gen:      No acute distress HEENT:  EOMI, sclera anicteric, ETT Neck:     No masses; no thyromegaly Lungs:    Clear to auscultation bilaterally; normal respiratory effort CV:         Regular rate and rhythm; no murmurs Abd:      + bowel sounds; soft, non-tender; no palpable masses, no distension Ext:    No edema; adequate peripheral perfusion Skin:      Mottled, cyanotic Neuro: Sedated, arousable  Lab/imaging reviewed Significant for potassium 5.1, creatinine 3.72, hemoglobin 10.2 Chest x-ray with bilateral infiltrates, right effusion   Resolved Hospital Problem list     Assessment & Plan:   Acute hypoxic respiratory failure from COVID 19 pneumonia. Fully vaccinated and boostered Status post Actemra 11/5  Plan: Full ventilator support, Reduce TV to 6cc/kg Titrate PEEP/and PaO2 greater than 65 Switch decadron to stress dose steroids.  VAP bundle PAD protocol, RASS goal -2 to -3 Follow CXR  Septic shock with multiple organ failure and lactic acidosis.  Present on admission Gram-negative rod bacteremia Plan Wean pressors as tolerated Follow lactic acid Stop vancomycin as MRSA PCR is negative Change abx to meropenem while we await speciation of cultures.  No further Actemra given gram-negative rod bacteremia and elevated LFTs  A fib with RVR with history of atrial fibrillation present prior to admission. Chronic combined CHF >> Echo from 04/23/20 with EF 50%.  HLD. Plan Continue heparin drip, typically on DOAC Telemetry monitoring Holding Toprol and Lasix  ESRD on HD. Mild hyperkalemia. -Has HD catheter Seen by nephrology Plan Continue CRRT  Hypoglycemia plan Increase D10 to 100 Start tube feeds DC SSI  Hx of hypothyroidism. - last TSH 6.24 from 05/05/20 Plan Continue Synthroid  Acute metabolic encephalopathy from sepsis, hypoxia. Plan Supportive care, PAD protocol  Poor prognosis with worsening septic shock  and increasing lactic acid. He is dying. I called both wife and daughter but it went straight to voice mail. Let a message requesting call back   Best practice (evaluated daily)  Diet: Starting tube feeds Pain/Anxiety/Delirium protocol (if indicated): Precedex VAP protocol (if indicated): 1/5 DVT prophylaxis: Heparin gtt GI prophylaxis: PPI Glucose control: On dextrose drip Mobility: Bed rest Disposition: ICU  Goals of Care:  Last date of multidisciplinary goals of care discussion: Pending Family and staff present: Pending, wife and daughter did not answer phone on attempt to call 1/6 and 1/7 Summary of discussion: Pending Follow up goals of care discussion due: Pending Code Status: Full code  Critical care time:    The patient is critically ill with multiple organ system failure and requires high complexity decision making for assessment and support, frequent evaluation and titration of therapies, advanced monitoring, review of radiographic studies and interpretation of complex data.   Critical Care Time devoted to patient care services, exclusive of separately billable procedures, described in this note is 35 minutes.   Marshell Garfinkel MD [facility] Please see [email or web address] for pager details.  06/18/2020, 8:19 AM

## 2020-06-23 NOTE — Progress Notes (Signed)
Pt's time of death: Jun 27, 2020 at 09/06/2216.   Auscultated heart and lung sound x1 min along w/Heather R, RN ... No lungs or heart sounds auscultated.    No visible chest rise seen   Notified eLink  Printed asystole strip and placed in chart  Called wife and notified her. Answered any questions she had. Lawrence Bonilla verb understanding and was very appreciative.

## 2020-06-23 NOTE — Progress Notes (Signed)
Around 2030 - Called and notified Honor Lilbourn... Spoke w/Stacey Stowe and referral # given: [id number]  Around 2040 - Tatiana Rubow from [employer] called and notified me that the pt was not a candidate for Organ donation... To call back with cardiac time of death.   Belongings given to pt's wife Makyi Ledo

## 2020-06-23 NOTE — Progress Notes (Signed)
Pt extubated at 2130 with the assistance of RRT per order given.   All pressors have been stopped and Fentanyl gtt has been started.

## 2020-06-23 NOTE — Procedures (Addendum)
Admit: 05/25/2020 LOS: 2  61M ESRD severe COVID19 PNA, shock  Current CRRT Prescription: Start Date: 05/28/20 Catheter: R IJ TDC BFR: 200 Pre Blood Pump: 500 4K DFR: 1500 4K Replacement Rate: 300 4K Goal UF: even to positive Anticoagulation: none in circuilt   S: Progressive shock on NE, VP, PE K 5.5 on 4K baths HCO3 16, AG 22 100% FIO2 Mottling of LEs P 6.8 Lactate > 11  O: 01/06 0701 - 01/07 0700 In: 8671.2 [I.V.:8541.8; IV Piggyback:129.4] Out: 780   Filed Weights   06/10/2020 1227 06/22/2020 1315 June 11, 2020 0500  Weight: 72.6 kg 72.6 kg 78.5 kg    Recent Labs  Lab 05/28/20 1735 2020-06-11 0055 2020/06/11 0418 06/11/2020 0908  NA 135 135 136 133*  K 4.5 4.5 5.1 5.5*  CL 94*  --  99 95*  CO2 20*  --  20* 16*  GLUCOSE 101*  --  70 95  BUN 85*  --  46* 35*  CREATININE 6.80*  --  3.72* 3.14*  CALCIUM 6.1*  --  6.4* 6.4*  PHOS 8.8*  --  6.2* 6.8*   Recent Labs  Lab 06/17/2020 1252 05/26/2020 2311 05/28/20 0251 05/28/20 0457 05/28/20 1950 Jun 11, 2020 0055 06-11-2020 0418  WBC 3.7*  --  4.0  --  8.7  --  9.2  NEUTROABS 2.8  --   --   --   --   --   --   HGB 12.5*   < > 10.7*   < > 10.4* 10.5* 10.2*  HCT 40.9   < > 35.4*   < > 31.9* 31.0* 31.4*  MCV 111.4*  --  112.7*  --  106.0*  --  106.4*  PLT 170  --  127*  --  PLATELET CLUMPS NOTED ON SMEAR, UNABLE TO ESTIMATE  --  65*   < > = values in this interval not displayed.    Scheduled Meds: . calcitRIOL  0.25 mcg Per Tube Daily  . chlorhexidine gluconate (MEDLINE KIT)  15 mL Mouth Rinse BID  . Chlorhexidine Gluconate Cloth  6 each Topical Daily  . folic acid  1 mg Per Tube Daily  . hydrocortisone sod succinate (SOLU-CORTEF) inj  50 mg Intravenous Q6H  . mouth rinse  15 mL Mouth Rinse 10 times per day  . midodrine  5 mg Per Tube TID WC  . multivitamin with minerals  1 tablet Per Tube Daily  . pantoprazole (PROTONIX) IV  40 mg Intravenous Q12H  . thiamine  100 mg Per Tube Daily  . thyroid  90 mg Per Tube Daily    Continuous Infusions: .  prismasol BGK 4/2.5 500 mL/hr at June 11, 2020 0253  .  prismasol BGK 4/2.5 300 mL/hr at 2020/06/11 0945  . sodium chloride    . ceFEPime (MAXIPIME) IV 200 mL/hr at 06/11/2020 1000  . dexmedetomidine (PRECEDEX) IV infusion 0.4 mcg/kg/hr (06/11/20 1000)  . dextrose 100 mL/hr at 06/11/20 1000  . fentaNYL infusion INTRAVENOUS Stopped (05/28/20 1839)  . heparin 300 Units/hr (June 11, 2020 0957)  . norepinephrine (LEVOPHED) Adult infusion 50 mcg/min (Jun 11, 2020 1000)  . phenylephrine (NEO-SYNEPHRINE) Adult infusion 400 mcg/min (06/11/2020 1000)  . prismasol BGK 4/2.5 1,500 mL/hr at June 11, 2020 0945  .  sodium bicarbonate (isotonic) infusion in sterile water 125 mL/hr at 06/11/20 1008  . vasopressin 0.03 Units/min (June 11, 2020 1000)   PRN Meds:.acetaminophen, acetaminophen, fentaNYL, heparin, heparin  ABG    Component Value Date/Time   PHART 7.300 (L) June 11, 2020 0055   PCO2ART 43.7 June 11, 2020 0055  PO2ART 93 June 05, 2020 0055   HCO3 22.1 05-Jun-2020 0055   TCO2 24 June 05, 2020 0055   ACIDBASEDEF 5.0 (H) 05-Jun-2020 0055   O2SAT 97.0 06-05-20 0055    A/P  1. ESRD 2. Severe hypoxic VDRF 2/2 COVID19 3. COVID19 4. Severe septic shock on NE/VP/PE 5. Hyperkalemia 6. Metabolic and Resp Acidosis  Change to 2K bath, inc Pre to 1000 and Post to 500. Prognosis is grim.  No UF, positive 2/2 shock.    Pearson Grippe, MD [facility]

## 2020-06-23 NOTE — Progress Notes (Signed)
Priest at Bedside from [place]. So is the wife, son, daughter and son in-law.

## 2020-06-23 NOTE — Progress Notes (Incomplete)
CRITICAL VALUE ALERT  Critical Value:  Calcium 6.4, lactic acid >11  Date & Time Notied:  2020-06-22 10:16 AM   Provider Notified: Dr Vaughan Browner  Orders Received/Actions taken: ***

## 2020-06-23 NOTE — Progress Notes (Signed)
ANTICOAGULATION CONSULT NOTE - Follow Up Consult  Pharmacy Consult for heparin Indication: Afib and h/o VTE  Labs: Recent Labs    06/07/2020 1252 06/06/2020 1508 05/28/20 0251 05/28/20 0457 05/28/20 1950 05/28/20 2030 2020-06-01 0055 2020-06-01 0418 2020-06-01 0908 2020-06-01 1539  HGB 12.5*   < > 10.7*   < > 10.4*  --  10.5* 10.2*  --   --   HCT 40.9   < > 35.4*   < > 31.9*  --  31.0* 31.4*  --   --   PLT 170  --  127*  --  PLATELET CLUMPS NOTED ON SMEAR, UNABLE TO ESTIMATE  --   --  65*  --   --   APTT 31   < >  --    < >  --  86*  --  108*  --  152*  LABPROT 14.1  --   --   --   --   --   --   --   --   --   INR 1.1  --   --   --   --   --   --   --   --   --   HEPARINUNFRC  --    < >  --    < >  --  0.87*  --  0.81*  --  0.58  CREATININE 7.78*  --  7.40*   < >  --   --   --  3.72* 3.14* 2.14*   < > = values in this interval not displayed.    Assessment: 48 yoM on heparin for AFib while home Eliquis on hold admitted with COVID-19 and shock. Heparin level and aPTT have both remained elevated after holding heparin x3 hours. Heparin likely elevated from recent DOAC use and aPTT likely from shock liver and acute illness coagulopathy.   Heparin level down this evening but remains slightly SUPRAtherapeutic of lower goal range (HL 0.58 << 0.81, goal of 0.3-0.5). aPTT went up and does not correlate with HL - likely elevated in the setting of patient's decline.    Heparin level appears to be more accurate this evening - so will make adjustments based off of that.   Goal of Therapy:  Heparin level 0.3-0.5 units/ml aPTT 66-85 seconds   Plan:  - Decrease Heparin to 200 units/hr - Will continue to monitor for any signs/symptoms of bleeding and will follow up with heparin level in 8 hours   Thank you for allowing pharmacy to be a part of this patient's care.  Alycia Rossetti, PharmD, BCPS Clinical Pharmacist Clinical phone for 06/01/2020: [phone] 06-01-20 5:01 PM   **Pharmacist phone  directory can now be found on [email or web address] ([email or web address]).  Listed under [place].

## 2020-06-23 NOTE — Progress Notes (Signed)
[facility] Progress Note Patient Name: Lawrence Bonilla DOB: 1950-10-20 MRN: [id number]   Date of Service  27-Jun-2020  HPI/Events of Note  Spoke with patient's wife, [name], who voices that given her husband's poor prognosis for survival,  she and the family are ready to proceed with comfort measure, withdraw all life sustaining measures and allow her husband to pass with comfort and dignity.   eICU Interventions  Will proceed with comfort measures via the PCCM withdrawal of life sustaining measures protocol.      Intervention Category Major Interventions: Other:  Lysle Dingwall 06-27-20, 8:51 PM

## 2020-06-23 NOTE — Progress Notes (Signed)
   11-Jun-2020 1830  Clinical Encounter Type  Visited With Health care provider  Visit Type Spiritual support  Referral From Nurse  Consult/Referral To Chaplain  Spiritual Encounters  Spiritual Needs Ritual  Stress Factors  Patient Stress Factors Loss  Family Stress Factors Loss  Chaplain responded to call from nurse that patient needed a priest for last rites. Chaplain explained that we have a priest (Father Barnabas Lister) that we call from [facility]. Chaplain called and call went to answering machine. Chaplain left a message with patient's name and room number and what was needed and asked that Father Barnabas Lister return the call. As of 8:00 PM, priest has not returned the call.

## 2020-06-23 NOTE — Progress Notes (Signed)
Father Barnabas Lister arrived at 2020 and was able to administer last rites to patient.

## 2020-06-23 NOTE — Progress Notes (Signed)
Nutrition Follow-up  DOCUMENTATION CODES:   Not applicable  INTERVENTION:   Begin Tube Feeding via OG tube: Vital AF 1.2 at 60 ml/hr Begin TF at 20 ml/hr; titrate by 10 mL q 8 hours until goal rate of 60 ml/hr Pro-Srouce TF 45 mL BID Provides 130 g of protein, 1808 kcals and 1166 mL of free water Meets 100% estimated calorie and protein needs  Add MVI with Minerals x 2 (with hx of bariatric surgery per surgical hx)  NUTRITION DIAGNOSIS:   Inadequate oral intake related to acute illness as evidenced by NPO status.  Ongoing   GOAL:   Patient will meet greater than or equal to 90% of their needs  Progressing  MONITOR:   Vent status,Labs,Weight trends,TF tolerance  REASON FOR ASSESSMENT:   Consult,Ventilator Enteral/tube feeding initiation and management  ASSESSMENT:   70 yo male admitted with septic shock secondary to ARDS from COVID-19 pneumonia requiring intubation, missed HD due to admission to hospital, plan for CRRT. PMH includes ESRD on HD, PVD, HTN, CHF, lymphedema, DM, bariatric surgery (?) listed in surgical hx   CRRT initiated 1/6. Goal UF even to positive. Mottling of LEs, poor prognosis noted. Gram negative rod bacteremia.  Patient remains intubated on ventilator support, requiring phenylephrine, vasopressin, norepinephrine, Precedex. MV: 16.4 L/min Temp (24hrs), Avg:96.7 F (35.9 C), Min:94.1 F (34.5 C), Max:98.4 F (36.9 C)   Spoke with CCM physician, okay for RD to order TF today.  EDW 70.5 kg; Current wt 78.5 kg, up from 72.6 kg on admission. I/O +9.9 L since admission  Labs: phosphorus 6.8 (H), potassium 5.5 (H), sodium 133 (L) Meds reviewed and include calcitriol, folic acid, solu-cortef, MVI with minerals, thiamine  IVF: D10 at 100 ml/h   Diet Order:   Diet Order            Diet NPO time specified Except for: Sips with Meds  Diet effective now                 EDUCATION NEEDS:   Not appropriate for education at this  time  Skin:  Skin Assessment: Skin Integrity Issues: Skin Integrity Issues:: DTI,Stage I DTI: buttocks Stage I: upper back, vertebral column  Last BM:  1/6  Height:   Ht Readings from Last 1 Encounters:  05/23/2020 5\' 11"  (1.803 m)    Weight:   Wt Readings from Last 1 Encounters:  2020/06/03 78.5 kg   BMI:  Body mass index is 24.14 kg/m.  Estimated Nutritional Needs:   Kcal:  5188-4166 kcals  Protein:  125-150 g  Fluid:  >/= 1.7 L   Lucas Mallow, RD, LDN, CNSC Please refer to Amion for contact information.

## 2020-06-23 NOTE — Plan of Care (Addendum)
  Interdisciplinary Goals of Care Family Meeting   Date carried out:: 06/20/20  Location of the meeting: Unit  Member's involved: Physician, Bedside Registered Nurse and Family Member or next of kin  Durable Power of Attorney or acting medical decision maker: Wife   Discussion: We discussed goals of care for Lawrence Bonilla .    We met with wife daughter and son-in-law.  Discussed his clinical deterioration with progressive septic shock, multiorgan failure, elevated lactic acid.  He will likely not survive this hospitalization  We are awaiting the arrival of Niv Junior his son from [place] to visit his father.  Family is considering comfort measures after that.  In the meantime goals of care changed to DNR with no escalation.  Code status: Full DNR  Disposition: Continue current acute care with escalation. No additional pressors  Time spent for the meeting: 20 mins  Taheera Thomann 06/20/20, 1:57 PM

## 2020-06-23 NOTE — Progress Notes (Signed)
   2020-06-24 2130  Vent Select  Vent end date 06/24/20  Vent end time 2130  Adult Ventilator Measurements  SpO2  (no reading)  Extubated to comfort care per md order.

## 2020-06-23 NOTE — Progress Notes (Signed)
Unable to obtain pulse ox, Dr. Vaughan Browner made aware.

## 2020-06-23 DEATH — deceased

## 2020-07-02 ENCOUNTER — Ambulatory Visit: Payer: Medicare HMO | Admitting: Cardiology

## 2020-09-03 ENCOUNTER — Ambulatory Visit (INDEPENDENT_AMBULATORY_CARE_PROVIDER_SITE_OTHER): Payer: Medicare HMO | Admitting: Nurse Practitioner

## 2021-01-10 IMAGING — CT CT HEAD W/O CM
3 series · 14 of 47 positions shown, 16 images · non-contrast
Comparison: MRI March 29, 2017

CLINICAL DATA: Ataxia, fall 5 days ago

EXAM:
CT HEAD WITHOUT CONTRAST; CT CERVICAL SPINE WITHOUT CONTRAST
TECHNIQUE: Contiguous axial images were obtained from the base of the skull
through the vertex without intravenous contrast.

[Series 2: head w o · axial · 0.44mm/px · z∈[+1462,+1612]mm · 8 of 36 slices shown, 10 images]
[im 3/36  brain]
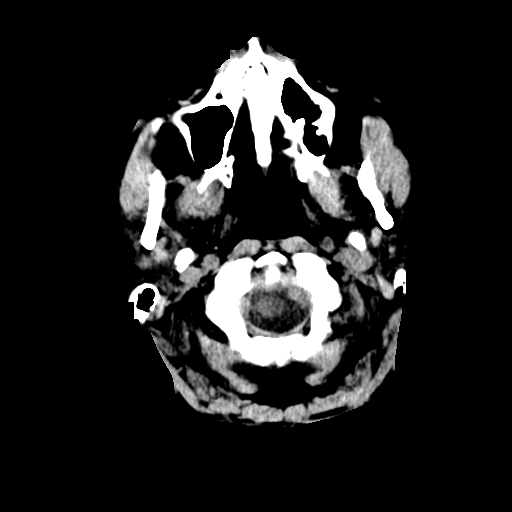
[im 3/36  bone]
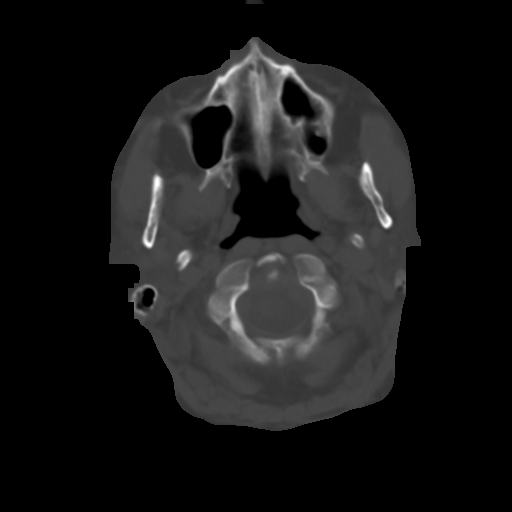
[im 8/36  brain]
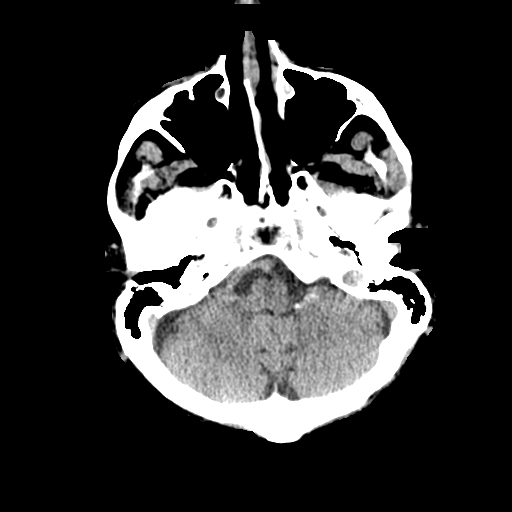
[im 11/36  brain]
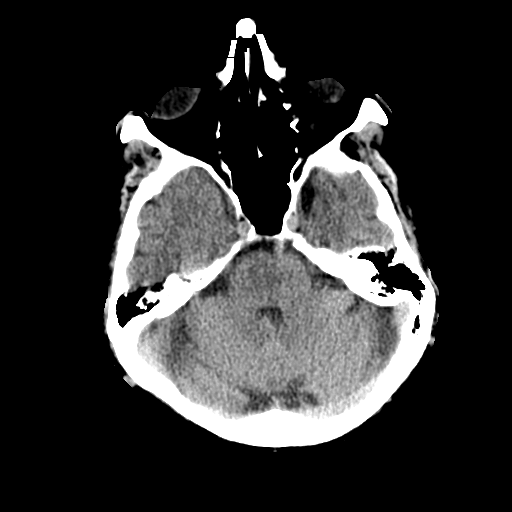
[im 16/36  brain]
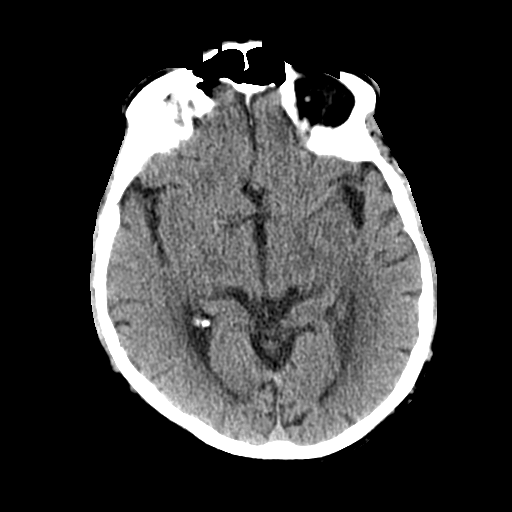
[im 20/36  brain]
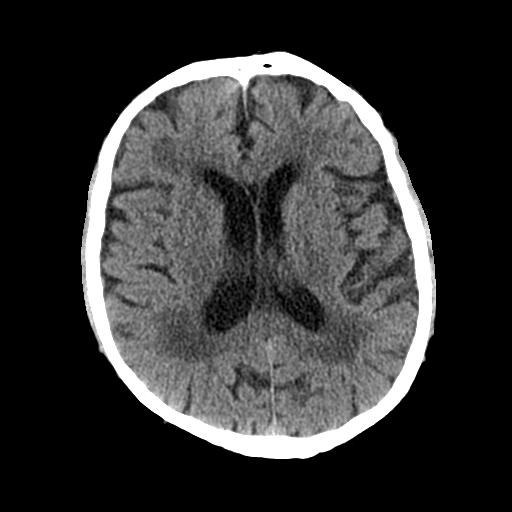
[im 20/36  bone]
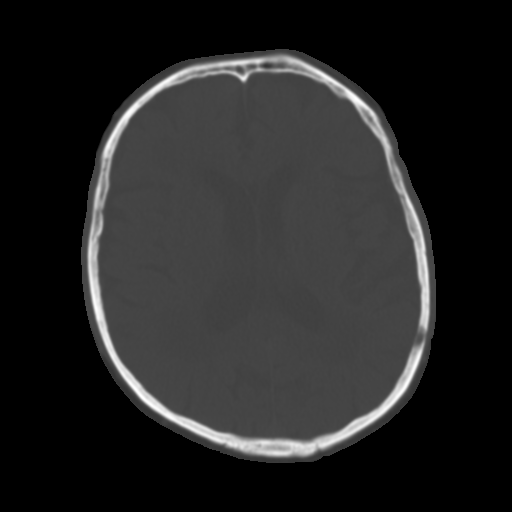
[im 25/36  brain]
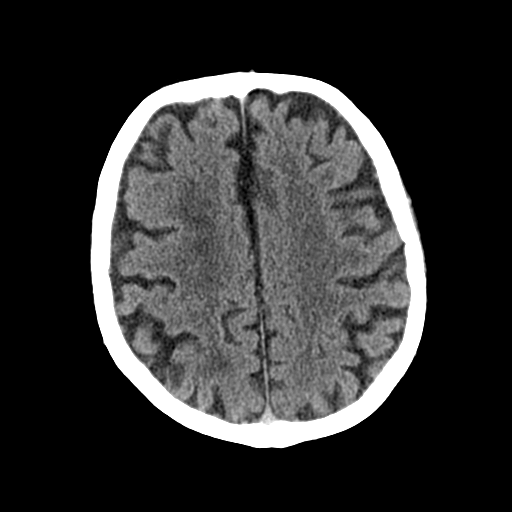
[im 28/36  brain]
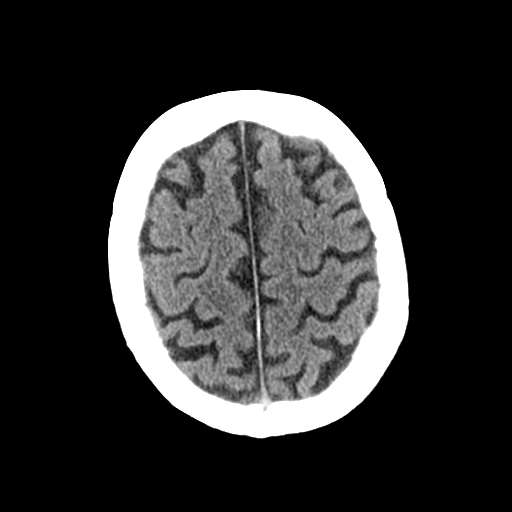
[im 33/36  brain]
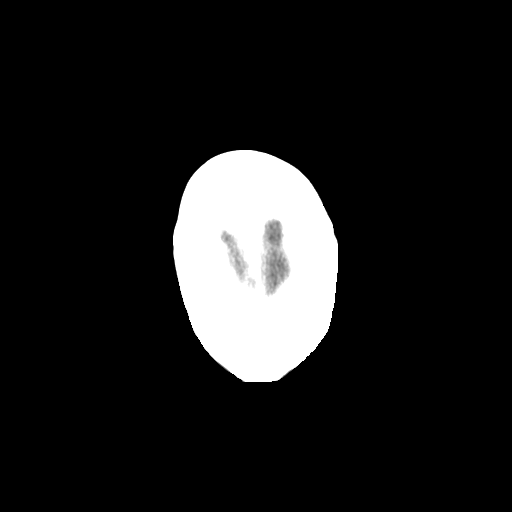

[Series 4: coronal soft · coronal · 0.35mm/px · 3 of 83 slices shown]
[im 28/83  brain]
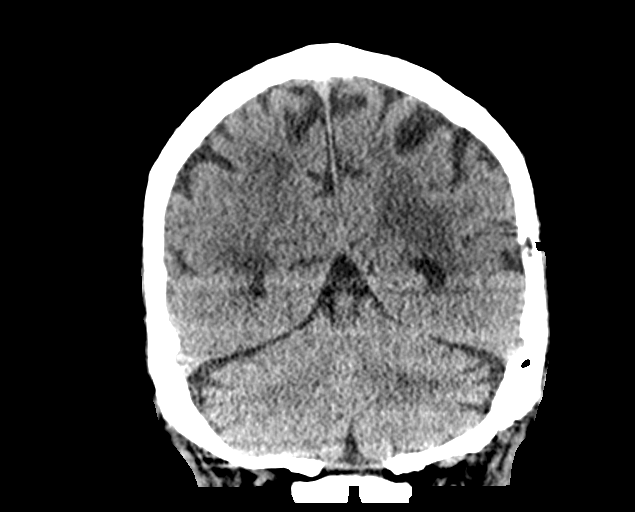
[im 37/83  brain]
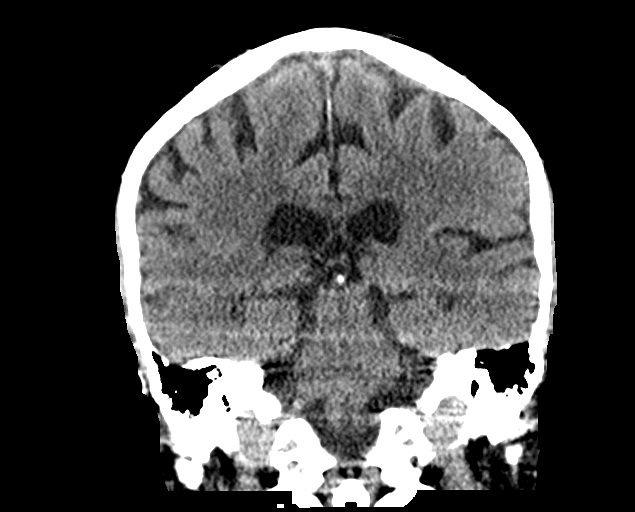
[im 46/83  brain]
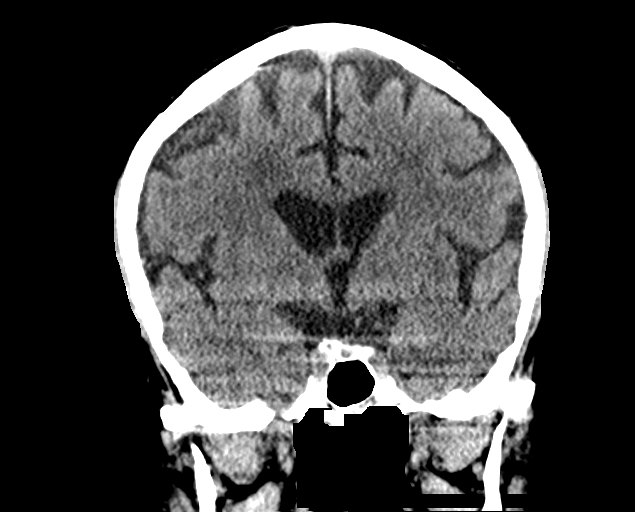

[Series 5: sagittal soft · sagittal · 0.36mm/px · 3 of 71 slices shown]
[im 24/71  brain]
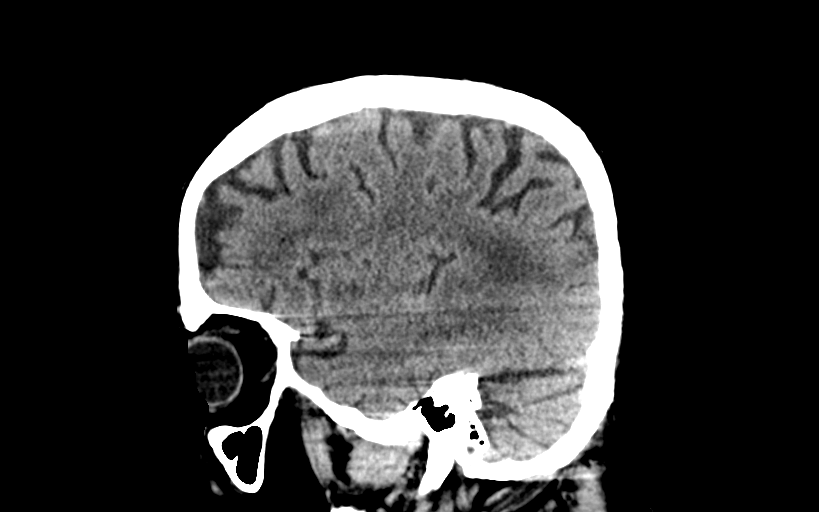
[im 36/71  brain]
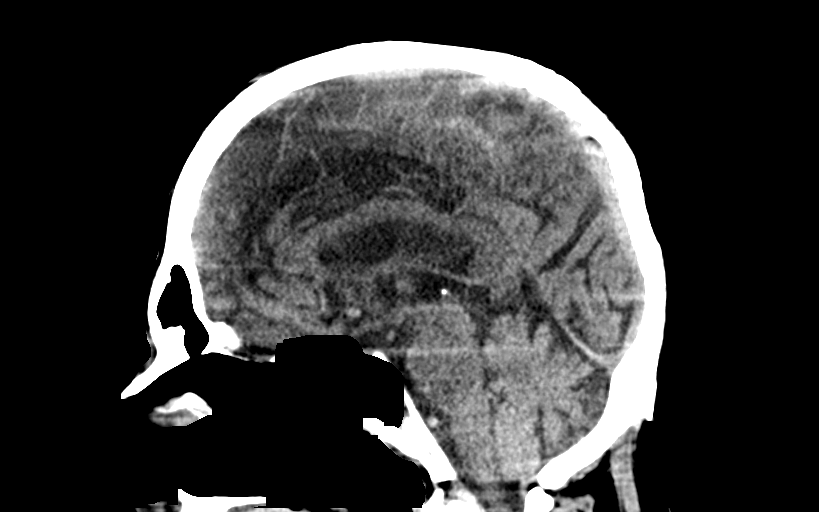
[im 47/71  brain]
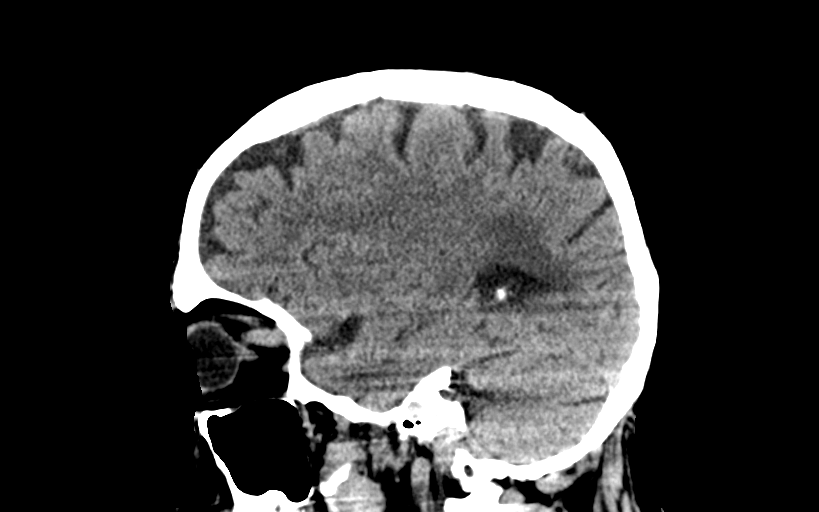

[14 of 47 positions shown; findings below may reference images not displayed]

FINDINGS: Brain: There is a area of hypodensity seen within the posterior
right occipital lobe. There is dilatation the ventricles and sulci
consistent with age-related atrophy. There patchy areas of
low-attenuation changes in the deep white matter consistent with
small vessel ischemia. No extra-axial collections.

Vascular: No hyperdense vessel or unexpected calcification.

Skull: The skull is intact. No fracture or focal lesion identified.

Sinuses/Orbits: The visualized paranasal sinuses and mastoid air
cells are clear. The orbits and globes intact.

Other: None

Cervical spine:

Alignment: Physiologic

Skull base and vertebrae: Visualized skull base is intact. No
atlanto-occipital dissociation. The vertebral body heights are well
maintained. No fracture or pathologic osseous lesion seen.

Soft tissues and spinal canal: The visualized paraspinal soft
tissues are unremarkable. No prevertebral soft tissue swelling is
seen. The spinal canal is grossly unremarkable, no large epidural
collection or significant canal narrowing.

Disc levels: Degenerative changes are seen most notable at C5-C6
with disc osteophyte complex and uncovertebral osteophytes.

Upper chest: The lung apices are clear. Thoracic inlet is within
normal limits.

Other: None
IMPRESSION: 1. Age-indeterminate infarct of the posterior right medial occipital
lobe.
2. No acute fracture or malalignment of the cervical spine.
3. Findings consistent with age related atrophy and chronic small
vessel ischemia

## 2021-01-10 IMAGING — CT CT CHEST W/O CM
2 of 4 series · 15 of 36 positions shown, 18 images · non-contrast
Comparison: Chest x-ray from earlier today

CLINICAL DATA: Right pleural effusion.  Weight loss.

EXAM:
CT CHEST WITHOUT CONTRAST
TECHNIQUE: Multidetector CT imaging of the chest was performed following the
standard protocol without IV contrast.

[Series 3: routine chest without · axial · non-contrast · 0.86mm/px · z∈[-562,-278]mm · 12 of 168 slices shown, 15 images]
[im 13/168  mediastinal]
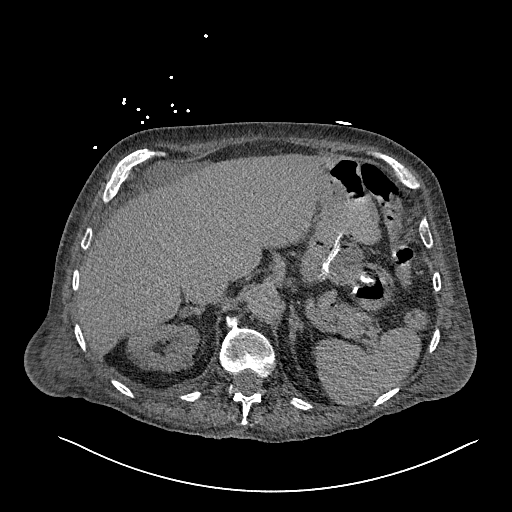
[im 13/168  lung]
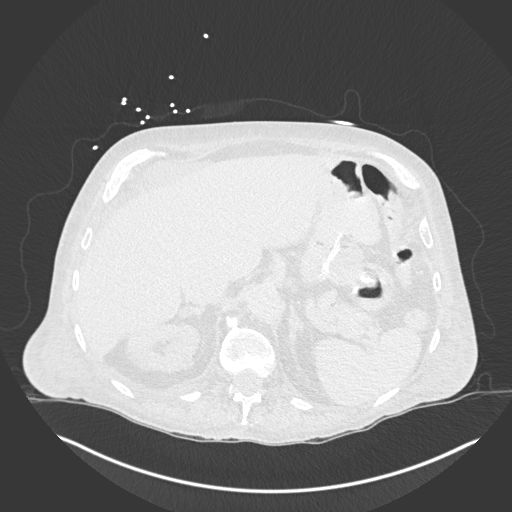
[im 26/168  lung]
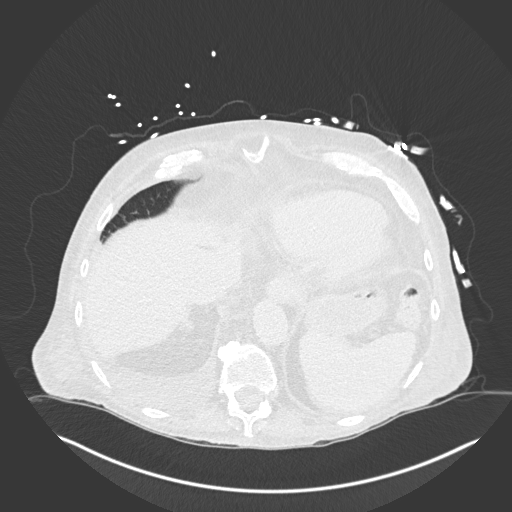
[im 39/168  lung]
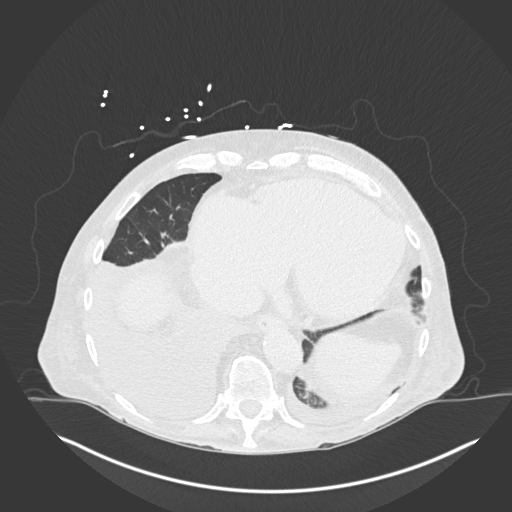
[im 52/168  lung]
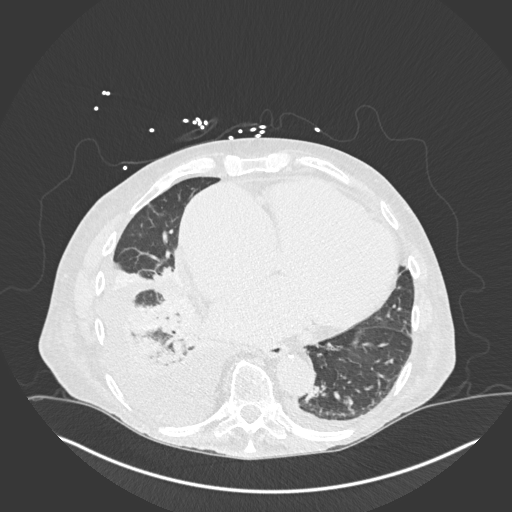
[im 65/168  mediastinal]
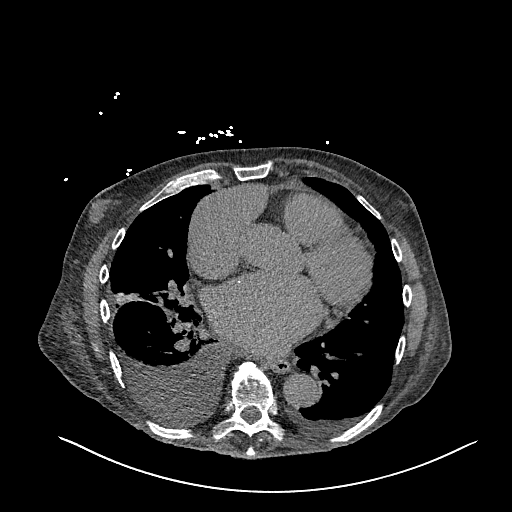
[im 65/168  lung]
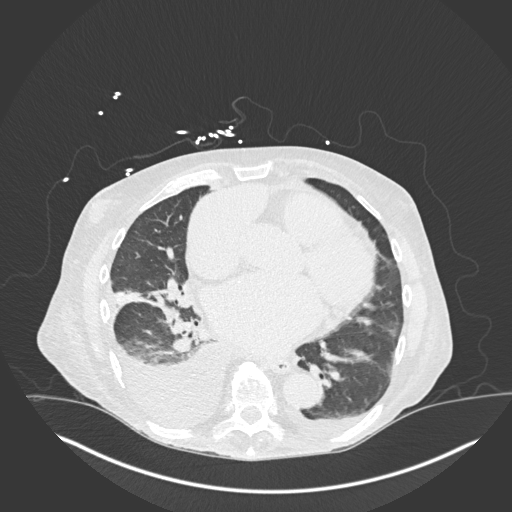
[im 78/168  lung]
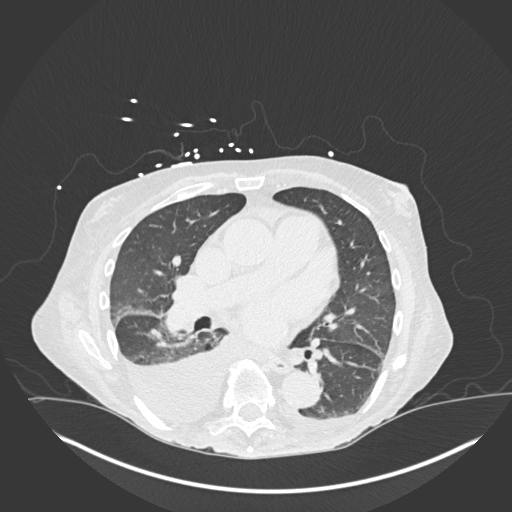
[im 90/168  lung]
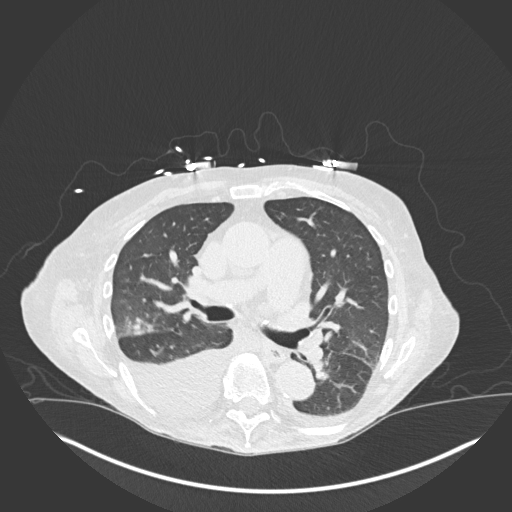
[im 103/168  lung]
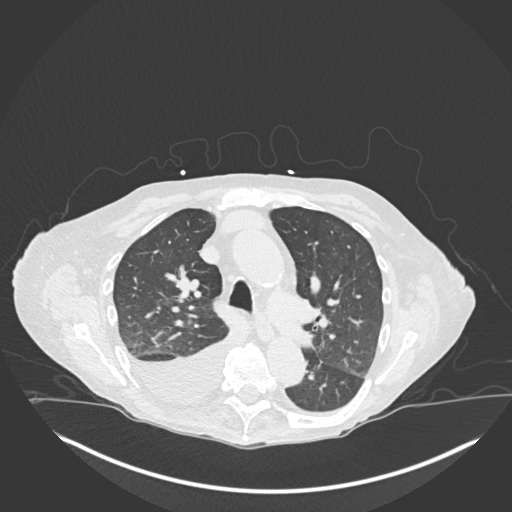
[im 116/168  mediastinal]
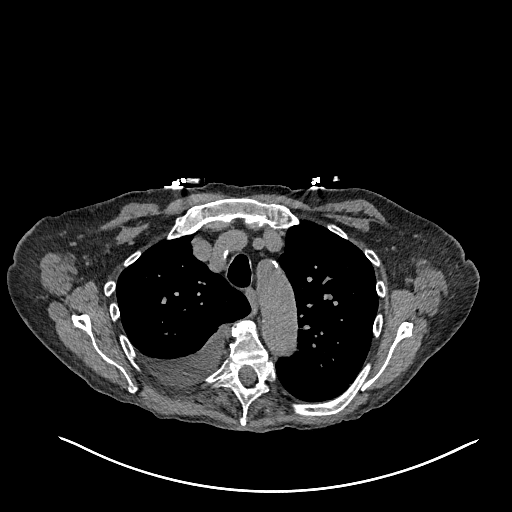
[im 116/168  lung]
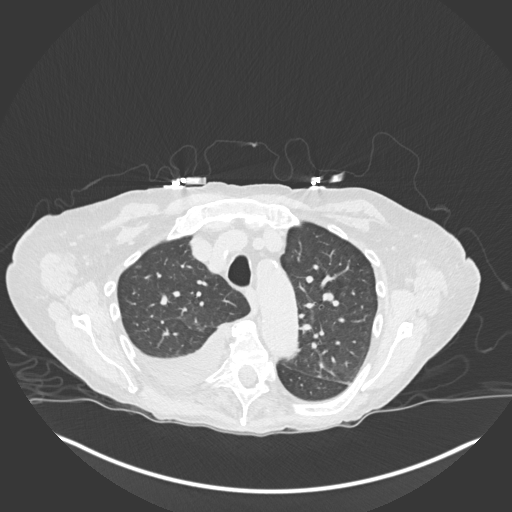
[im 129/168  lung]
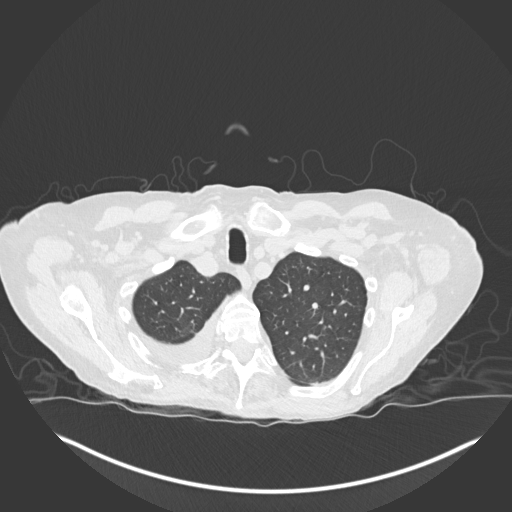
[im 142/168  lung]
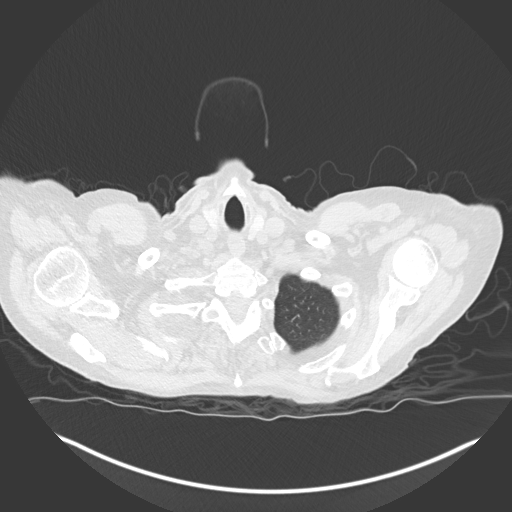
[im 155/168  lung]
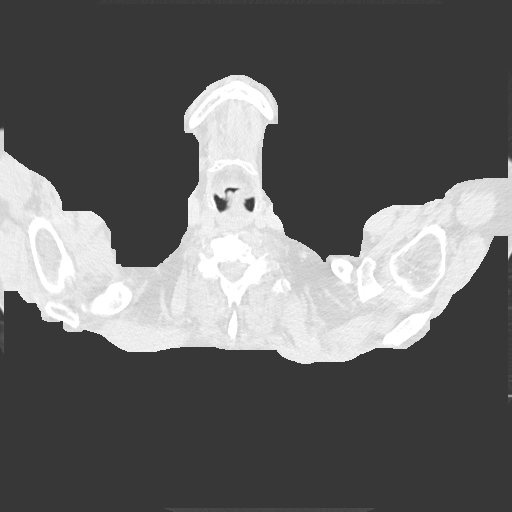

[Series 6: coronal · coronal · 0.68mm/px · 3 of 168 slices shown]
[im 34/168  lung]
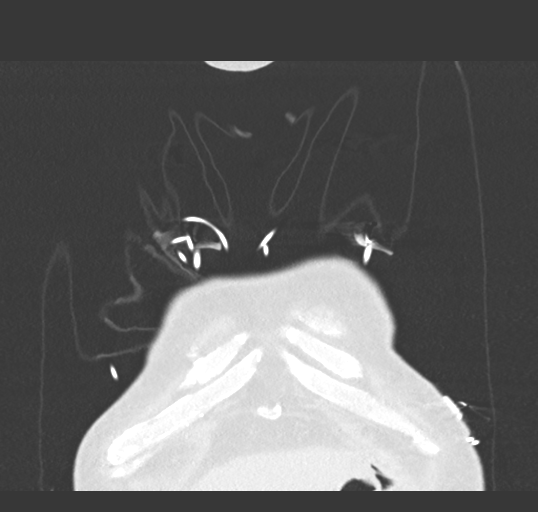
[im 67/168  lung]
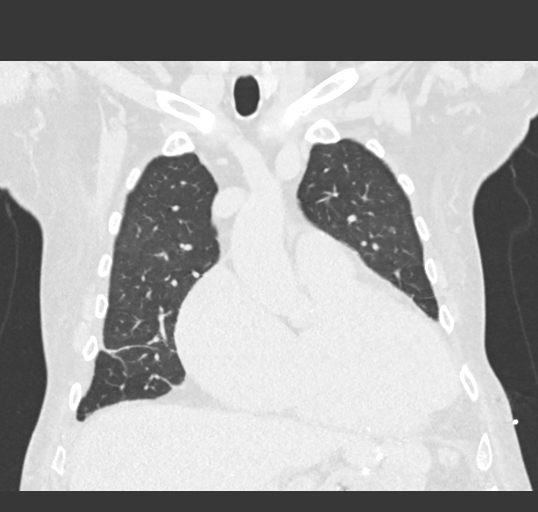
[im 101/168  lung]
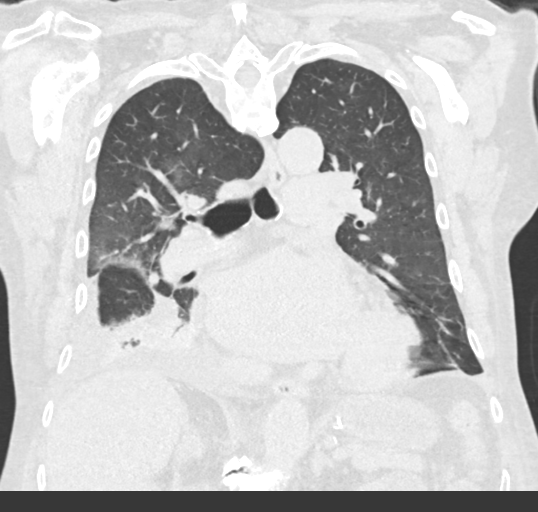

[15 of 36 positions shown; findings below may reference images not displayed]

FINDINGS: Cardiovascular: Cardiomegaly. No pericardial effusion. Aortic and
coronary atherosclerosis.

Mediastinum/Nodes: Negative for adenopathy or mass.

Lungs/Pleura: Moderate right pleural effusion that is low-density
and dependent. Small left pleural effusion. Multi segment
atelectasis mainly in the right lower lung. No pulmonary edema. No
definite consolidation. No masslike findings.

Upper Abdomen: Gastric bypass. Renal cystic densities which are
partially covered. There is likely also a proteinaceous cyst in the
upper pole left kidney.

Musculoskeletal: Degenerative spondylosis with multi-level
ankylosis. There is exaggerated thoracic kyphosis. No acute finding

Other: Gynecomastia.
IMPRESSION: 1. Moderate right and small left pleural effusion which is dependent
and simple appearing.
2. Right-sided atelectasis.
3. Cardiomegaly and atherosclerosis.

## 2021-05-23 DEATH — deceased
